# Patient Record
Sex: Male | Born: 1980 | Race: Black or African American | Hispanic: No | Marital: Single | Smoking: Never smoker
Health system: Southern US, Community
[De-identification: ages and names within clinical notes are randomized; demographics above are authoritative.]

## PROBLEM LIST (undated history)

## (undated) DIAGNOSIS — K219 Gastro-esophageal reflux disease without esophagitis: Secondary | ICD-10-CM

## (undated) DIAGNOSIS — F79 Unspecified intellectual disabilities: Secondary | ICD-10-CM

## (undated) DIAGNOSIS — Z87898 Personal history of other specified conditions: Secondary | ICD-10-CM

## (undated) DIAGNOSIS — F32A Depression, unspecified: Secondary | ICD-10-CM

## (undated) DIAGNOSIS — F840 Autistic disorder: Secondary | ICD-10-CM

## (undated) DIAGNOSIS — F329 Major depressive disorder, single episode, unspecified: Secondary | ICD-10-CM

## (undated) DIAGNOSIS — R569 Unspecified convulsions: Secondary | ICD-10-CM

## (undated) HISTORY — DX: Gastro-esophageal reflux disease without esophagitis: K21.9

## (undated) HISTORY — DX: Unspecified intellectual disabilities: F79

## (undated) HISTORY — DX: Personal history of other specified conditions: Z87.898

## (undated) HISTORY — DX: Depression, unspecified: F32.A

## (undated) HISTORY — DX: Autistic disorder: F84.0

---
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
## 2019-02-07 ENCOUNTER — Other Ambulatory Visit: Payer: Self-pay | Admitting: Family Medicine

## 2019-02-07 ENCOUNTER — Ambulatory Visit
Admission: RE | Admit: 2019-02-07 | Discharge: 2019-02-07 | Disposition: A | Discharge status: Home / Self Care | Payer: Medicare Other | Source: Ambulatory Visit | Attending: Family Medicine | Admitting: Family Medicine

## 2019-02-07 DIAGNOSIS — M24549 Contracture, unspecified hand: Secondary | ICD-10-CM

## 2019-02-07 DIAGNOSIS — M24574 Contracture, right foot: Secondary | ICD-10-CM

## 2019-08-15 ENCOUNTER — Encounter: Payer: Self-pay | Admitting: Gastroenterology

## 2019-09-14 ENCOUNTER — Ambulatory Visit: Payer: Medicare Other | Admitting: Gastroenterology

## 2019-10-26 ENCOUNTER — Other Ambulatory Visit (INDEPENDENT_AMBULATORY_CARE_PROVIDER_SITE_OTHER): Payer: Medicare Other

## 2019-10-26 ENCOUNTER — Ambulatory Visit (INDEPENDENT_AMBULATORY_CARE_PROVIDER_SITE_OTHER): Payer: Medicare Other | Admitting: Gastroenterology

## 2019-10-26 ENCOUNTER — Encounter: Payer: Self-pay | Admitting: Gastroenterology

## 2019-10-26 VITALS — BP 102/64 | HR 68 | Temp 97.0°F | Wt 128.0 lb

## 2019-10-26 DIAGNOSIS — K625 Hemorrhage of anus and rectum: Secondary | ICD-10-CM

## 2019-10-26 DIAGNOSIS — K649 Unspecified hemorrhoids: Secondary | ICD-10-CM

## 2019-10-26 LAB — BASIC METABOLIC PANEL
BUN: 11 mg/dL (ref 6–23)
CO2: 32 mEq/L (ref 19–32)
Calcium: 9.9 mg/dL (ref 8.4–10.5)
Chloride: 102 mEq/L (ref 96–112)
Creatinine, Ser: 0.87 mg/dL (ref 0.40–1.50)
GFR: 117.84 mL/min (ref >60.00)
Glucose, Bld: 84 mg/dL (ref 70–99)
Potassium: 3.9 mEq/L (ref 3.5–5.1)
Sodium: 138 mEq/L (ref 135–145)

## 2019-10-26 LAB — CBC
HCT: 38.6 % — ABNORMAL LOW (ref 39.0–52.0)
Hemoglobin: 13 g/dL (ref 13.0–17.0)
MCHC: 33.8 g/dL (ref 30.0–36.0)
MCV: 93.8 fl (ref 78.0–100.0)
Platelets: 181 10*3/uL (ref 150.0–400.0)
RBC: 4.12 Mil/uL — ABNORMAL LOW (ref 4.22–5.81)
RDW: 13.4 % (ref 11.5–15.5)
WBC: 3.2 10*3/uL — ABNORMAL LOW (ref 4.0–10.5)

## 2019-10-26 LAB — TSH: TSH: 5.8 u[IU]/mL — ABNORMAL HIGH (ref 0.35–4.50)

## 2019-10-26 NOTE — Patient Instructions (Signed)
Your provider has requested that you go to the basement level for lab work before leaving today. Press "B" on the elevator. The lab is located at the first door on the left as you exit the elevator.  If you are age 40 or older, your body mass index should be between 23-30. Your There is no height or weight on file to calculate BMI. If this is out of the aforementioned range listed, please consider follow up with your Primary Care Provider.  If you are age 25 or younger, your body mass index should be between 19-25. Your There is no height or weight on file to calculate BMI. If this is out of the aformentioned range listed, please consider follow up with your Primary Care Provider.    Due to recent changes in healthcare laws, you may see the results of your imaging and laboratory studies on MyChart before your provider has had a chance to review them.  We understand that in some cases there may be results that are confusing or concerning to you. Not all laboratory results come back in the same time frame and the provider may be waiting for multiple results in order to interpret others.  Please give [place] 48 hours in order for your provider to thoroughly review all the results before contacting the office for clarification of your results.   It has been recommended to you by your physician that you have a(n) colonoscopy completed. Dr.Mansouraty will call Guardian this afternoon to discuss- Andres [name] [phone].   Start Fiber Supplement- A high fiber diet with plenty of fluids (up to 8 glasses of water daily) is suggested to relieve these symptoms.  Metamucil, 1 tablespoon once or twice daily can be used to keep bowels regular if needed.   If bleeding occurs then you may start Preparation H ointment placed on outside and to first digit perineal twice daily up to 5 days.   Thank you for choosing me and [facility].  Dr. Meridee Score

## 2019-10-26 NOTE — Progress Notes (Signed)
GASTROENTEROLOGY OUTPATIENT CLINIC VISIT   Primary Care Provider Leilani Able, MD [address] [place] [address] [phone]  Referring Provider Leilani Able, MD [address] [place],  [place] [address] [phone]  Patient Profile: Rommie Dunn is a 39 y.o. male with a pmh significant for seizures, intellectual disability under guardianship, hemorrhoids.  The patient presents to the [facility] for an evaluation and management of problem(s) noted below:  Problem List 1. BRBPR (bright red blood per rectum)   2. Hemorrhoids, unspecified hemorrhoid type     History of Present Illness This is the patient's first visit to the outpatient [facility] clinic.  He is accompanied by his caretaker today.  Unfortunately the patient cannot partake of or give any history.  The history that we obtain from his caretaker is such that he has had episodes of rectal bleeding noted on the toilet paper and sometimes in the toilet bowl over the course of the last year.  This is happened at least 3-4 times since June 2020.  When evaluated at [facility] family practice, the patient could not have a rectal exam performed due to cooperation issues.  He was referred for discussion and evaluation in the GI office and that is his visit here today.  There is an unknown family history.  The patient has a large midline surgical scar but the etiology of this or what was done is not clear.  He is a ward of the state and has a guardian [name]).  The patient is not on nonsteroidals.  From asking the caretaker, there is no other significant history that is obtained in regards to GI issues as the patient cannot verbally express himself.  GI Review of Systems Positive as above Negative for overt nausea/vomiting, melena  Review of Systems (Per Caretaker) General: Denies fevers/chills/weight loss HEENT: Denies oral lesions Gastroenterological: See HPI Genitourinary: Denies darkened  urine or hematuria Hematological: Denies easy bruising/bleeding Dermatological: Denies jaundice   Medications Current Outpatient Medications  Medication Sig Dispense Refill  . ARIPiprazole (ABILIFY) 10 MG tablet Take 10 mg by mouth daily.    . Cholecalciferol (VITAMIN D3) 20 MCG (800 UNIT) TABS Take 1 tablet by mouth once.    . famotidine (PEPCID) 20 MG tablet Take 20 mg by mouth 2 (two) times daily.    . traZODone (DESYREL) 100 MG tablet Take 100 mg by mouth every evening.     No current facility-administered medications for this visit.    Allergies No Known Allergies  Histories Past Medical History:  Diagnosis Date  . Disorder of intellectual development   . History of seizures    History reviewed. No pertinent surgical history. Social History   Socioeconomic History  . Marital status: Single    Spouse name: Not on file  . Number of children: Not on file  . Years of education: Not on file  . Highest education level: Not on file  Occupational History  . Not on file  Tobacco Use  . Smoking status: Never Smoker  Substance and Sexual Activity  . Alcohol use: Not Currently  . Drug use: Not Currently  . Sexual activity: Not Currently  Other Topics Concern  . Not on file  Social History Narrative  . Not on file   Social Determinants of Health   Financial Resource Strain:   . Difficulty of Paying Living Expenses:   Food Insecurity:   . Worried About Programme researcher, broadcasting/film/video in the Last Year:   .  Ran Out of Food in the Last Year:   Transportation Needs:   . Freight forwarder (Medical):   Marland Kitchen Lack of Transportation (Non-Medical):   Physical Activity:   . Days of Exercise per Week:   . Minutes of Exercise per Session:   Stress:   . Feeling of Stress :   Social Connections:   . Frequency of Communication with Friends and Family:   . Frequency of Social Gatherings with Friends and Family:   . Attends Religious Services:   . Active Member of Clubs or Organizations:    . Attends Banker Meetings:   Marland Kitchen Marital Status:   Intimate Partner Violence:   . Fear of Current or Ex-Partner:   . Emotionally Abused:   Marland Kitchen Physically Abused:   . Sexually Abused:    Family History  Problem Relation Age of Onset  . Mental illness Mother    The patient's family history is not able due to patient's intellectual disability.  Otherwise, I have reviewed his medical, social, and family history in detail and updated the electronic medical record as necessary.    PHYSICAL EXAMINATION  BP 102/64 (BP Location: Left Arm, Patient Position: Sitting, Cuff Size: Normal)   Pulse 68   Temp (!) 97 F (36.1 C) (Other (Comment))   Wt 128 lb (58.1 kg)  Wt Readings from Last 3 Encounters:  10/26/19 128 lb (58.1 kg)  GEN: Resting in chair today but able to get onto exam table, appears older than stated age  PSYCH: Cooperative (he had received some Klonopin prior by caretaker) EYE: Conjunctivae pink, sclerae anicteric ENT: MMM, would not open his mouth CV: RR without R/Gs  RESP: CTAB posteriorly, without wheezing GI: NABS, soft, large midline surgical scar that is well-healed, NT/ND, without rebound or guarding, no HSM appreciated GU: Perineal exam shows no large external hemorrhoids, digital rectal exam suggests internal hemorrhoids but no palpable rectal lesions, anoscopy could not be performed due to cooperation MSK/EXT: No significant lower extremity edema SKIN: No jaundice   REVIEW OF DATA  I reviewed the following data at the time of this encounter:  GI Procedures and Studies  No relevant studies to review  Laboratory Studies  Reported hemoglobin 11.2 at his family practice visit in January  Imaging Studies  No relevant studies to review   ASSESSMENT  Mr. Beaumier is a 39 y.o. male with a pmh significant for seizures, intellectual disability under guardianship, hemorrhoids.  The patient is seen today for evaluation and management of:  1. BRBPR (bright  red blood per rectum)   2. Hemorrhoids, unspecified hemorrhoid type    The patient is hemodynamically stable.  From a clinical perspective, this seems like it is most likely that the patient has had hemorrhoidal bleeding due to the frequency and episodic nature of things.  Unfortunately due to his intellectual disability he is unable to be a part of his visit in regards to giving [place] a history.  He has a very large midline surgical scar of an unclear etiology per the caretaker but potentially the guardian may have more information about this.  I would recommend that we initiate fiber supplementation as well as have MiraLAX available if needed.  I recommend that we consider a colonoscopy.  We will reach out to the guardian [name] - [phone]) in the next couple of days and see if we can get the approval for moving forward with a colonoscopy.  We will obtain some labs to ensure that he  does not have evidence of overt anemia or progressive anemia as well as rule out other etiologies for potential constipation.  All patient questions were answered, to the best of my ability, and the patient agrees to the aforementioned plan of action with follow-up as indicated.   PLAN  Laboratories as outlined below Recommend diagnostic colonoscopy Reach out to [place] ([phone]) in coming days to obtain approval and potentially more information/history about midline surgical scar   Orders Placed This Encounter  Procedures  . CBC  . Basic Metabolic Panel (BMET)  . Calcium, ionized  . TSH    New Prescriptions   No medications on file   Modified Medications   No medications on file    Planned Follow Up No follow-ups on file.   Total Time in Face-to-Face and in Coordination of Care for patient including independent/personal interpretation/review of prior testing, medical history, examination, medication adjustment, communicating results with the patient directly, and documentation  with the EHR is 45 minutes.   Justice Britain, MD [facility] Advanced Endoscopy Office # [phone]

## 2019-10-27 LAB — CALCIUM, IONIZED: Calcium, Ion: 5.21 mg/dL (ref 4.8–5.6)

## 2019-10-28 ENCOUNTER — Telehealth: Payer: Self-pay | Admitting: Gastroenterology

## 2019-10-28 ENCOUNTER — Encounter: Payer: Self-pay | Admitting: Gastroenterology

## 2019-10-28 DIAGNOSIS — K649 Unspecified hemorrhoids: Secondary | ICD-10-CM | POA: Insufficient documentation

## 2019-10-28 DIAGNOSIS — K625 Hemorrhage of anus and rectum: Secondary | ICD-10-CM | POA: Insufficient documentation

## 2019-10-28 NOTE — Telephone Encounter (Signed)
Tried to call patient's guardian [name] [phone]) on 10/28/2019. Left voicemail for call back. Goal of discussion with guardian is to try to obtain consent for a diagnostic colonoscopy for evaluation of bright red blood per rectum and also see if any additional history is noted for the the patient's midline surgical scar or surgical history. I left a voicemail for patient's guardian to call back next week. Inez Catalina, please place a reminder for you all to reach out to the guardian in case you have not seen any update from me by 11/01/2019 or if the patient's guardian has not given [place] a call back. Thanks. GM

## 2019-11-01 NOTE — Telephone Encounter (Signed)
Also attempted a call back and left a voicemail today 11/01/2019. Hopefully, we will hear back from the guardian. Patty, if we do not hear anything back by next Monday or Tuesday, please reach out to the patient's contact who brought him in her name is in the chart and update her that we have tried to get a hold of Ms. Patel but not been successful. Hopefully she could then help get [place] in contact with Ms. Allena Katz or anyone else that may be able to help [place] with Mr. Hua care. Thanks. GM

## 2019-11-01 NOTE — Telephone Encounter (Signed)
Left message on machine to call back  

## 2019-11-02 NOTE — Telephone Encounter (Signed)
Staff message to call number in chart next Tuesday if we have not heard from the caregiver this week.

## 2019-11-02 NOTE — Telephone Encounter (Signed)
I was able to touch base with Andres [name] today at [phone].  She is the patient's guardian of the state.  The guardian has been aware of this particular patient's history for quite a while however it is not clear what the etiology of his large surgical scar in the abdomen is from.  She has known about it since he was at least 39 years of age but again unclear what the etiology of it is.  We discussed the role of a potential diagnostic colonoscopy.  She describes that in the patient's prior home that they also were experiencing issues of intermittent rectal bleeding.  I think with all of this information at this time it is most reasonable for [place] to do a diagnostic evaluation ensure we are not missing any mass or lesion or bleeding polyp.  I suspect will turn out to be hemorrhoidal in nature but still need to do our due diligence. The risks and benefits of endoscopic evaluation were discussed with the patient's guardian; these include but are not limited to the risk of perforation, infection, bleeding, missed lesions, lack of diagnosis, severe illness requiring hospitalization, as well as anesthesia and sedation related illnesses.  The patient guardian is agreeable to proceed.  I will have my team reach out to Ms. Patel tomorrow to work on obtaining the consent process over the telephone.  She would like a call back after 10 in the morning on 11/03/2019. We can proceed with scheduling his colonoscopy after the consent process has been completed.  Patty and Rovonda can you please work on this? Thanks. GM

## 2019-11-03 NOTE — Telephone Encounter (Signed)
Records faxed to PCP and mailed to guardian and pt.

## 2019-11-03 NOTE — Telephone Encounter (Signed)
Left message on machine to call back  

## 2019-11-03 NOTE — Telephone Encounter (Signed)
Mansouraty, Netty Starring., MD  Loretha Stapler, RN  Shadrach Bartunek, Please make a copy of the results for the patient and send a letter to his home/guardianship home as well as his PCP/referring provider.  This is for the records.  His TSH level is slightly elevated and could be consistent with possible developing hypothyroidism.  He needs additional work-up by his primary care doctor.  The patient's hemoglobin has normalized from prior of 11.8 in the chart previously from his referring provider. No follow-up labs in our clinic needed at this time. GM

## 2019-11-06 ENCOUNTER — Other Ambulatory Visit: Payer: Self-pay

## 2019-11-06 DIAGNOSIS — Z1159 Encounter for screening for other viral diseases: Secondary | ICD-10-CM

## 2019-11-06 NOTE — Telephone Encounter (Signed)
The pt guardian has been advised of the appt for 5/11-pre visit, 5/14- Covid, and 5/18 colon.  She will come in and sign consent at the pre visit and make the facility where the pt lives aware of the appts.

## 2019-11-06 NOTE — Telephone Encounter (Signed)
Left message on machine to call back  

## 2019-12-06 ENCOUNTER — Ambulatory Visit: Payer: Medicare Other | Admitting: *Deleted

## 2019-12-06 ENCOUNTER — Other Ambulatory Visit: Payer: Self-pay

## 2019-12-06 VITALS — Ht 67.0 in | Wt 128.0 lb

## 2019-12-06 DIAGNOSIS — Z01818 Encounter for other preprocedural examination: Secondary | ICD-10-CM

## 2019-12-06 DIAGNOSIS — K625 Hemorrhage of anus and rectum: Secondary | ICD-10-CM

## 2019-12-06 NOTE — Progress Notes (Signed)
Andres [name], pt's guardian, is here to sign pt's consent form.  Spoke with Andres [name] at pt's group home to obtain hx and go over prep instructions.  I spoke with them both to obtain medical hx.  Andres [name] states she will helping pt prep for this procedure at home.  She will be here for pt's procedure and Andres [name] will be available by phone if needed.  Pt is able to ambulate on his own per guardian.  Per guardian and Andres [name]- pt has been at home for over a year and no seizure activity noted.  Pt is aware that care partner will wait in the car during procedure; if they feel like they will be too hot or cold to wait in the car; they may wait in the 4 th floor lobby. Patient is aware to bring only one care partner. We want them to wear a mask (we do not have any that we can provide them), practice social distancing, and we will check their temperatures when they get here.  I did remind the patient that their care partner needs to stay in the parking lot the entire time and have a cell phone available, we will call them when the pt is ready for discharge. Patient will wear mask into building.  Prep instructions given to Andres [name] and she states she will get information to pt's home.   No egg or soy allergy  No home oxygen use   No medications for weight loss taken  covid test 12-08-19 at 10:00

## 2019-12-08 ENCOUNTER — Ambulatory Visit (INDEPENDENT_AMBULATORY_CARE_PROVIDER_SITE_OTHER): Payer: Medicare Other

## 2019-12-08 ENCOUNTER — Other Ambulatory Visit: Payer: Self-pay

## 2019-12-08 DIAGNOSIS — Z1159 Encounter for screening for other viral diseases: Secondary | ICD-10-CM

## 2019-12-08 LAB — SARS CORONAVIRUS 2 (TAT 6-24 HRS): SARS Coronavirus 2: NEGATIVE

## 2019-12-12 ENCOUNTER — Encounter: Payer: Self-pay | Admitting: Gastroenterology

## 2019-12-12 ENCOUNTER — Ambulatory Visit (AMBULATORY_SURGERY_CENTER): Payer: Medicare Other | Admitting: Gastroenterology

## 2019-12-12 ENCOUNTER — Other Ambulatory Visit: Payer: Self-pay

## 2019-12-12 VITALS — BP 95/62 | HR 52 | Temp 97.1°F | Resp 17 | Ht 67.0 in | Wt 128.0 lb

## 2019-12-12 DIAGNOSIS — K921 Melena: Secondary | ICD-10-CM

## 2019-12-12 DIAGNOSIS — K648 Other hemorrhoids: Secondary | ICD-10-CM | POA: Diagnosis not present

## 2019-12-12 DIAGNOSIS — K625 Hemorrhage of anus and rectum: Secondary | ICD-10-CM | POA: Diagnosis not present

## 2019-12-12 NOTE — Op Note (Signed)
Center Patient Name: Andres [name] Procedure Date: 12/12/2019 2:08 PM MRN: [id number] Endoscopist: Justice Britain , MD Age: 39 Referring MD:  Date of Birth: 03-13-1981 Gender: Male Account #: [id number] Procedure:                Colonoscopy Indications:              Hematochezia, Rectal bleeding Medicines:                Monitored Anesthesia Care Procedure:                Pre-Anesthesia Assessment:                           - Prior to the procedure, a History and Physical                            was performed, and patient medications and                            allergies were reviewed. The patient's tolerance of                            previous anesthesia was also reviewed. The risks                            and benefits of the procedure and the sedation                            options and risks were discussed with the patient.                            All questions were answered, and informed consent                            was obtained. Prior Anticoagulants: The patient has                            taken no previous anticoagulant or antiplatelet                            agents. ASA Grade Assessment: II - A patient with                            mild systemic disease. After reviewing the risks                            and benefits, the patient was deemed in                            satisfactory condition to undergo the procedure.                           After obtaining informed consent, the colonoscope  was passed under direct vision. Throughout the                            procedure, the patient's blood pressure, pulse, and                            oxygen saturations were monitored continuously. The                            Colonoscope was introduced through the anus and                            advanced to the the ileocolonic anastomosis. The                            colonoscopy was performed  without difficulty. The                            patient tolerated the procedure. The quality of the                            bowel preparation was adequate. The terminal ileum                            was photographed. Scope In: 2:15:00 PM Scope Out: 2:29:09 PM Scope Withdrawal Time: 0 hours 8 minutes 41 seconds  Total Procedure Duration: 0 hours 14 minutes 9 seconds  Findings:                 The digital rectal exam findings include                            hemorrhoids. Pertinent negatives include no                            palpable rectal lesions.                           There was evidence of a prior functional end-to-end                            ileo-colonic anastomosis in the ascending colon.                            This was patent and was characterized by healthy                            appearing mucosa. The anastomosis was traversed.                           The neo-terminal ileum appeared normal.                           The lumen of the colon (entire examined portion)  was significantly dilated - consistent with                            potential Institutionalized bowel or Ogilvie's-like                            syndrome. Suction of the air via Endoscope was                            performed to decrease caliber of stool.                           Normal mucosa was found in the entire colon                            otherwise.                           Non-bleeding non-thrombosed external and internal                            hemorrhoids were found during retroflexion, during                            perianal exam and during digital exam. The                            hemorrhoids were Grade II (internal hemorrhoids                            that prolapse but reduce spontaneously). Complications:            No immediate complications. Estimated Blood Loss:     Estimated blood loss: none. Impression:               -  Hemorrhoids found on digital rectal exam.                           - Patent functional end-to-end ileo-colonic                            anastomosis, characterized by healthy appearing                            mucosa.                           - The examined portion of the neo-terminal ileum                            was normal.                           - Dilated in the entire examined colon - consistent                            with Institutionalized bowel or  Ogilvie's-like                            syndrome. Suction of air via endoscope.                           - Normal mucosa in the entire examined colon                            otherwise.                           - Non-bleeding non-thrombosed external and internal                            hemorrhoids. Recommendation:           - The patient will be observed post-procedure,                            until all discharge criteria are met.                           - Discharge patient to home.                           - Patient has a contact number available for                            emergencies. The signs and symptoms of potential                            delayed complications were discussed with the                            patient. Return to normal activities tomorrow.                            Written discharge instructions were provided to the                            patient.                           - High fiber diet.                           - Use FiberCon 1 tablet PO daily.                           - Colace 200 mg daily.                           - Miralax 1-2 times daily to try and have at least                            1 bowel movement daily.                           -  Recommend considering Anusol suppositories QHS x                            1-week and then every other evening before bedtime                            in order to decrease inflammation in the                             rectum/anal vault.                           - If issues of bleeding recur/persist recommend a                            Colorectal surgery referral for discussion of                            hemorrhoidal banding in conjunction with                            hemorrhoidectomy - will likely need to be done as                            EUA due to patient's cooperation and needs.                           - Recommend for follow up in clinic in 20-month to                            see how he is doing and try to do an Anoscopy and                            re-evaluate the hemorrhoids.                           - Repeat colonoscopy in 10 years for screening                            purposes.                           - The findings and recommendations were discussed                            with the designated responsible adult. GJustice Britain MD 12/12/2019 2:40:23 PM

## 2019-12-12 NOTE — Patient Instructions (Signed)
Handout on hemorrhoids given to you today  Use FiberCon 1 tablet daily  Colace 200 mg daily  Use miralax 1-2 times a day  To try to have at least 1 bowel movement a day  Anusol suppositories sent to [facility] [place] [facility]   YOU HAD AN ENDOSCOPIC PROCEDURE TODAY AT THE [facility]:   Refer to the procedure report that was given to you for any specific questions about what was found during the examination.  If the procedure report does not answer your questions, please call your gastroenterologist to clarify.  If you requested that your care partner not be given the details of your procedure findings, then the procedure report has been included in a sealed envelope for you to review at your convenience later.  YOU SHOULD EXPECT: Some feelings of bloating in the abdomen. Passage of more gas than usual.  Walking can help get rid of the air that was put into your GI tract during the procedure and reduce the bloating. If you had a lower endoscopy (such as a colonoscopy or flexible sigmoidoscopy) you may notice spotting of blood in your stool or on the toilet paper. If you underwent a bowel prep for your procedure, you may not have a normal bowel movement for a few days.  Please Note:  You might notice some irritation and congestion in your nose or some drainage.  This is from the oxygen used during your procedure.  There is no need for concern and it should clear up in a day or so.  SYMPTOMS TO REPORT IMMEDIATELY:   Following lower endoscopy (colonoscopy or flexible sigmoidoscopy):  Excessive amounts of blood in the stool  Significant tenderness or worsening of abdominal pains  Swelling of the abdomen that is new, acute  Fever of 100F or higher  For urgent or emergent issues, a gastroenterologist can be reached at any hour by calling [phone]. Do not use MyChart messaging for urgent concerns.    DIET:  We do recommend a small meal at first, but then you may proceed to your  regular diet.  Drink plenty of fluids but you should avoid alcoholic beverages for 24 hours.  ACTIVITY:  You should plan to take it easy for the rest of today and you should NOT DRIVE or use heavy machinery until tomorrow (because of the sedation medicines used during the test).    FOLLOW UP: Our staff will call the number listed on your records 48-72 hours following your procedure to check on you and address any questions or concerns that you may have regarding the information given to you following your procedure. If we do not reach you, we will leave a message.  We will attempt to reach you two times.  During this call, we will ask if you have developed any symptoms of COVID 19. If you develop any symptoms (ie: fever, flu-like symptoms, shortness of breath, cough etc.) before then, please call [phone].  If you test positive for Covid 19 in the 2 weeks post procedure, please call and report this information to [place].    If any biopsies were taken you will be contacted by phone or by letter within the next 1-3 weeks.  Please call us at [phone] if you have not heard about the biopsies in 3 weeks.    SIGNATURES/CONFIDENTIALITY: You and/or your care partner have signed paperwork which will be entered into your electronic medical record.  These signatures attest to the fact that that the information  on your After Visit Summary has been reviewed and is understood.  Full responsibility of the confidentiality of this discharge information lies with you and/or your care-partner.  

## 2019-12-12 NOTE — Progress Notes (Signed)
Temp LC V/s DT I have reviewed the patient's medical history in detail and updated the computerized patient record.  

## 2019-12-12 NOTE — Progress Notes (Signed)
Report to PACU, RN, vss, BBS= Clear.  

## 2019-12-14 ENCOUNTER — Telehealth: Payer: Self-pay

## 2019-12-14 NOTE — Telephone Encounter (Signed)
  Follow up Call-  Call back number 12/12/2019  Post procedure Call Back phone  # [phone]  Permission to leave phone message Yes     Patient questions:  Do you have a fever, pain , or abdominal swelling? No. Pain Score  0 *  Have you tolerated food without any problems? Yes.    Have you been able to return to your normal activities? Yes.    Do you have any questions about your discharge instructions: Diet   No. Medications  No. Follow up visit  No.  Do you have questions or concerns about your Care? No.  Actions: * If pain score is 4 or above: No action needed, pain <4.  1. Have you developed a fever since your procedure? no  2.   Have you had an respiratory symptoms (SOB or cough) since your procedure? no  3.   Have you tested positive for COVID 19 since your procedure no  4.   Have you had any family members/close contacts diagnosed with the COVID 19 since your procedure?  no   If yes to any of these questions please route to Laverna Peace, RN and Charlett Lango, RN

## 2020-06-16 ENCOUNTER — Other Ambulatory Visit: Payer: Self-pay

## 2020-06-16 ENCOUNTER — Ambulatory Visit (HOSPITAL_COMMUNITY)
Admission: EM | Admit: 2020-06-16 | Discharge: 2020-06-16 | Disposition: A | Discharge status: Home / Self Care | Payer: Medicare Other | Attending: Family Medicine | Admitting: Family Medicine

## 2020-06-16 ENCOUNTER — Encounter (HOSPITAL_COMMUNITY): Payer: Self-pay | Admitting: *Deleted

## 2020-06-16 DIAGNOSIS — S0181XA Laceration without foreign body of other part of head, initial encounter: Secondary | ICD-10-CM

## 2020-06-16 NOTE — Discharge Instructions (Signed)
Keep area clean and reasonably dry Try to keep these tapes on for 5 days, 7 is better When it is time to remove them, put a warm washcloth on thumb to soak the area and then gently tease them of the room Watch for infection.  Report any redness, drainage, pus Return as needed

## 2020-06-16 NOTE — ED Provider Notes (Signed)
[facility]    CSN: [id number] Arrival date & time: 06/16/20  1630      History   Chief Complaint Chief Complaint  Patient presents with  . Laceration  . Fall    HPI Andres [name] is a 39 y.o. male.   HPI  Andres [name] is a 39 year old gentleman with severe intellectual developmental deficiencies and autism who is here today for a follow-up.  He has balance issues.  He fell while in his bedroom and hit his face on the bed frame.  His caregiver was in the next room and immediately ran in.  He was sitting on the floor.  There is blood.  He was not unconscious, was crying out in pain.  Bleeding controlled with pressure.  Band-Aid placed and he was brought in directly.  He appears to be at his baseline at this time with ambulation and behavior  Past Medical History:  Diagnosis Date  . Autism   . Depression   . Disorder of intellectual development   . GERD (gastroesophageal reflux disease)   . History of seizures   . Seizures (HCC)    no seizure activity seen in over a year    Patient Active Problem List   Diagnosis Date Noted  . BRBPR (bright red blood per rectum) 10/28/2019  . Hemorrhoids 10/28/2019    History reviewed. No pertinent surgical history.     Home Medications    Prior to Admission medications   Medication Sig Start Date End Date Taking? Authorizing Provider  ARIPiprazole (ABILIFY) 10 MG tablet Take 10 mg by mouth daily.   Yes [provider]  Cholecalciferol (VITAMIN D3) 20 MCG (800 UNIT) TABS Take 1 tablet by mouth once.   Yes [provider]  famotidine (PEPCID) 20 MG tablet Take 20 mg by mouth 2 (two) times daily.   Yes [provider]  traZODone (DESYREL) 100 MG tablet Take 100 mg by mouth every evening.   Yes [provider]    Family History Family History  Problem Relation Age of Onset  . Mental illness Mother     Social History Social History   Tobacco Use  . Smoking status: Never Smoker  .  Smokeless tobacco: Never Used  Vaping Use  . Vaping Use: Never used  Substance Use Topics  . Alcohol use: Not Currently  . Drug use: Not Currently     Allergies   Lactose   Review of Systems Review of Systems  See HPI Physical Exam Triage Vital Signs ED Triage Vitals  Enc Vitals Group     BP --      Pulse Rate 06/16/20 1734 94     Resp 06/16/20 1734 20     Temp 06/16/20 1734 (!) 97.5 F (36.4 C)     Temp Source 06/16/20 1734 Tympanic     SpO2 06/16/20 1734 99 %     Weight 06/16/20 1728 103 lb 3.2 oz (46.8 kg)     Height 06/16/20 1728 5\' 6"  (1.676 m)     Head Circumference --      Peak Flow --      Pain Score --      Pain Loc --      Pain Edu? --      Excl. in GC? --    No data found.  Updated Vital Signs Pulse 94   Temp (!) 97.5 F (36.4 C) (Tympanic)   Resp 20   Ht 5\' 6"  (1.676 m)   Wt  46.8 kg   SpO2 99%   BMI 16.66 kg/m  Unable to obtain blood pressure due to patient cooperation, it caused anxiety, refused care      Physical Exam Constitutional:      General: He is not in acute distress.    Appearance: He is well-developed.     Comments: Patient is quite lean.  He keeps his fists clenched in his arms flexed with hands under chin.  Antalgic gait.  No appreciable verbalization  HENT:     Head: Normocephalic.   Eyes:     Conjunctiva/sclera: Conjunctivae normal.     Pupils: Pupils are equal, round, and reactive to light.  Cardiovascular:     Rate and Rhythm: Normal rate.  Pulmonary:     Effort: Pulmonary effort is normal. No respiratory distress.  Abdominal:     General: There is no distension.     Palpations: Abdomen is soft.  Musculoskeletal:        General: Normal range of motion.     Cervical back: Normal range of motion.  Skin:    General: Skin is warm and dry.  Neurological:     Mental Status: He is alert.  Psychiatric:        Attention and Perception: He is inattentive.        Speech: He is noncommunicative.        Behavior:  Behavior is uncooperative, slowed and withdrawn.   After discussion with the caregiver it was felt that it would be unduly traumatic for Andres [name] to have sutures placedunless he could be sedated.  Sedation was not available in the urgent care center.  Scarring is not a big issue.  Appropriate healing is the key.  The area is wiped down with a skin cleanser.  Benzoin placed.  3 Steri-Strips closed the wound nicely.  As long as patient does not pick at these or pulls them off, it should heal well.  Recommended increased observation.  Recommend they keep it bandaged.  Recommend mittens at night.   UC Treatments / Results  Labs (all labs ordered are listed, but only abnormal results are displayed) Labs Reviewed - No data to display  EKG   Radiology No results found.  Procedures Procedures (including critical care time)  Medications Ordered in UC Medications - No data to display  Initial Impression / Assessment and Plan / UC Course  I have reviewed the triage vital signs and the nursing notes.  Pertinent labs & imaging results that were available during my care of the patient were reviewed by me and considered in my medical decision making (see chart for details).     Final Clinical Impressions(s) / UC Diagnoses   Final diagnoses:  Facial laceration, initial encounter     Discharge Instructions     Keep area clean and reasonably dry Try to keep these tapes on for 5 days, 7 is better When it is time to remove them, put a warm washcloth on thumb to soak the area and then gently tease them of the room Watch for infection.  Report any redness, drainage, pus Return as needed   ED Prescriptions    None     PDMP not reviewed this encounter.   Eustace Moore, MD 06/16/20 Flossie Buffy

## 2020-06-16 NOTE — ED Triage Notes (Signed)
Pt from a [facility] home with Care giver. Pt has a lac to Rtside of eye brow with a dsy on it. Care giver reports Pt had a unwitnessed fall and may have hit the wooden section of bed. . Pt is non verable at base line.

## 2020-07-01 ENCOUNTER — Emergency Department (HOSPITAL_COMMUNITY): Payer: Medicare Other

## 2020-07-01 ENCOUNTER — Inpatient Hospital Stay (HOSPITAL_COMMUNITY)
Admission: EM | Admit: 2020-07-01 | Discharge: 2020-08-21 | DRG: 377 | Disposition: A | Discharge status: Hospice | Payer: Medicare Other | Attending: Internal Medicine | Admitting: Internal Medicine

## 2020-07-01 ENCOUNTER — Encounter (HOSPITAL_COMMUNITY): Payer: Self-pay | Admitting: Student

## 2020-07-01 DIAGNOSIS — J869 Pyothorax without fistula: Secondary | ICD-10-CM | POA: Diagnosis not present

## 2020-07-01 DIAGNOSIS — R748 Abnormal levels of other serum enzymes: Secondary | ICD-10-CM | POA: Diagnosis present

## 2020-07-01 DIAGNOSIS — L89122 Pressure ulcer of left upper back, stage 2: Secondary | ICD-10-CM | POA: Diagnosis present

## 2020-07-01 DIAGNOSIS — E871 Hypo-osmolality and hyponatremia: Secondary | ICD-10-CM | POA: Diagnosis not present

## 2020-07-01 DIAGNOSIS — R7401 Elevation of levels of liver transaminase levels: Secondary | ICD-10-CM

## 2020-07-01 DIAGNOSIS — Z515 Encounter for palliative care: Secondary | ICD-10-CM

## 2020-07-01 DIAGNOSIS — Z8719 Personal history of other diseases of the digestive system: Secondary | ICD-10-CM

## 2020-07-01 DIAGNOSIS — E860 Dehydration: Secondary | ICD-10-CM | POA: Diagnosis present

## 2020-07-01 DIAGNOSIS — L8990 Pressure ulcer of unspecified site, unspecified stage: Secondary | ICD-10-CM | POA: Insufficient documentation

## 2020-07-01 DIAGNOSIS — D696 Thrombocytopenia, unspecified: Secondary | ICD-10-CM | POA: Diagnosis present

## 2020-07-01 DIAGNOSIS — I96 Gangrene, not elsewhere classified: Secondary | ICD-10-CM | POA: Diagnosis not present

## 2020-07-01 DIAGNOSIS — M2450 Contracture, unspecified joint: Secondary | ICD-10-CM | POA: Diagnosis present

## 2020-07-01 DIAGNOSIS — Z789 Other specified health status: Secondary | ICD-10-CM

## 2020-07-01 DIAGNOSIS — D638 Anemia in other chronic diseases classified elsewhere: Secondary | ICD-10-CM | POA: Diagnosis present

## 2020-07-01 DIAGNOSIS — Z818 Family history of other mental and behavioral disorders: Secondary | ICD-10-CM

## 2020-07-01 DIAGNOSIS — K567 Ileus, unspecified: Secondary | ICD-10-CM

## 2020-07-01 DIAGNOSIS — F32A Depression, unspecified: Secondary | ICD-10-CM | POA: Diagnosis present

## 2020-07-01 DIAGNOSIS — R140 Abdominal distension (gaseous): Secondary | ICD-10-CM

## 2020-07-01 DIAGNOSIS — Z0189 Encounter for other specified special examinations: Secondary | ICD-10-CM

## 2020-07-01 DIAGNOSIS — I959 Hypotension, unspecified: Secondary | ICD-10-CM

## 2020-07-01 DIAGNOSIS — A4151 Sepsis due to Escherichia coli [E. coli]: Secondary | ICD-10-CM | POA: Diagnosis not present

## 2020-07-01 DIAGNOSIS — R188 Other ascites: Secondary | ICD-10-CM | POA: Diagnosis not present

## 2020-07-01 DIAGNOSIS — E8809 Other disorders of plasma-protein metabolism, not elsewhere classified: Secondary | ICD-10-CM | POA: Diagnosis present

## 2020-07-01 DIAGNOSIS — M24561 Contracture, right knee: Secondary | ICD-10-CM | POA: Diagnosis present

## 2020-07-01 DIAGNOSIS — W19XXXA Unspecified fall, initial encounter: Secondary | ICD-10-CM | POA: Diagnosis present

## 2020-07-01 DIAGNOSIS — Q02 Microcephaly: Secondary | ICD-10-CM

## 2020-07-01 DIAGNOSIS — Z7189 Other specified counseling: Secondary | ICD-10-CM

## 2020-07-01 DIAGNOSIS — E876 Hypokalemia: Secondary | ICD-10-CM | POA: Diagnosis present

## 2020-07-01 DIAGNOSIS — Z20822 Contact with and (suspected) exposure to covid-19: Secondary | ICD-10-CM | POA: Diagnosis present

## 2020-07-01 DIAGNOSIS — F840 Autistic disorder: Secondary | ICD-10-CM | POA: Diagnosis present

## 2020-07-01 DIAGNOSIS — R310 Gross hematuria: Secondary | ICD-10-CM | POA: Diagnosis not present

## 2020-07-01 DIAGNOSIS — R29721 NIHSS score 21: Secondary | ICD-10-CM | POA: Diagnosis not present

## 2020-07-01 DIAGNOSIS — K5981 Ogilvie syndrome: Secondary | ICD-10-CM | POA: Diagnosis present

## 2020-07-01 DIAGNOSIS — R7982 Elevated C-reactive protein (CRP): Secondary | ICD-10-CM | POA: Diagnosis present

## 2020-07-01 DIAGNOSIS — K31819 Angiodysplasia of stomach and duodenum without bleeding: Secondary | ICD-10-CM

## 2020-07-01 DIAGNOSIS — R197 Diarrhea, unspecified: Secondary | ICD-10-CM

## 2020-07-01 DIAGNOSIS — T7401XA Adult neglect or abandonment, confirmed, initial encounter: Secondary | ICD-10-CM

## 2020-07-01 DIAGNOSIS — M6282 Rhabdomyolysis: Secondary | ICD-10-CM | POA: Diagnosis present

## 2020-07-01 DIAGNOSIS — R627 Adult failure to thrive: Secondary | ICD-10-CM | POA: Diagnosis present

## 2020-07-01 DIAGNOSIS — K31811 Angiodysplasia of stomach and duodenum with bleeding: Secondary | ICD-10-CM | POA: Diagnosis not present

## 2020-07-01 DIAGNOSIS — Z66 Do not resuscitate: Secondary | ICD-10-CM | POA: Diagnosis not present

## 2020-07-01 DIAGNOSIS — R4182 Altered mental status, unspecified: Secondary | ICD-10-CM

## 2020-07-01 DIAGNOSIS — J918 Pleural effusion in other conditions classified elsewhere: Secondary | ICD-10-CM | POA: Diagnosis not present

## 2020-07-01 DIAGNOSIS — N179 Acute kidney failure, unspecified: Secondary | ICD-10-CM | POA: Diagnosis not present

## 2020-07-01 DIAGNOSIS — Z9889 Other specified postprocedural states: Secondary | ICD-10-CM

## 2020-07-01 DIAGNOSIS — Z7982 Long term (current) use of aspirin: Secondary | ICD-10-CM

## 2020-07-01 DIAGNOSIS — R7881 Bacteremia: Secondary | ICD-10-CM | POA: Diagnosis present

## 2020-07-01 DIAGNOSIS — E43 Unspecified severe protein-calorie malnutrition: Secondary | ICD-10-CM | POA: Diagnosis present

## 2020-07-01 DIAGNOSIS — K529 Noninfective gastroenteritis and colitis, unspecified: Secondary | ICD-10-CM | POA: Diagnosis present

## 2020-07-01 DIAGNOSIS — I639 Cerebral infarction, unspecified: Secondary | ICD-10-CM

## 2020-07-01 DIAGNOSIS — J90 Pleural effusion, not elsewhere classified: Secondary | ICD-10-CM

## 2020-07-01 DIAGNOSIS — R7303 Prediabetes: Secondary | ICD-10-CM | POA: Diagnosis present

## 2020-07-01 DIAGNOSIS — I313 Pericardial effusion (noninflammatory): Secondary | ICD-10-CM | POA: Diagnosis not present

## 2020-07-01 DIAGNOSIS — K2100 Gastro-esophageal reflux disease with esophagitis, without bleeding: Secondary | ICD-10-CM

## 2020-07-01 DIAGNOSIS — Y93E1 Activity, personal bathing and showering: Secondary | ICD-10-CM

## 2020-07-01 DIAGNOSIS — R55 Syncope and collapse: Secondary | ICD-10-CM | POA: Diagnosis present

## 2020-07-01 DIAGNOSIS — Z79899 Other long term (current) drug therapy: Secondary | ICD-10-CM

## 2020-07-01 DIAGNOSIS — R509 Fever, unspecified: Secondary | ICD-10-CM

## 2020-07-01 DIAGNOSIS — Z781 Physical restraint status: Secondary | ICD-10-CM

## 2020-07-01 DIAGNOSIS — K56609 Unspecified intestinal obstruction, unspecified as to partial versus complete obstruction: Secondary | ICD-10-CM

## 2020-07-01 DIAGNOSIS — J189 Pneumonia, unspecified organism: Secondary | ICD-10-CM | POA: Diagnosis present

## 2020-07-01 DIAGNOSIS — Z681 Body mass index (BMI) 19 or less, adult: Secondary | ICD-10-CM

## 2020-07-01 DIAGNOSIS — Z7401 Bed confinement status: Secondary | ICD-10-CM

## 2020-07-01 DIAGNOSIS — F79 Unspecified intellectual disabilities: Secondary | ICD-10-CM | POA: Diagnosis present

## 2020-07-01 DIAGNOSIS — G8250 Quadriplegia, unspecified: Secondary | ICD-10-CM | POA: Diagnosis present

## 2020-07-01 DIAGNOSIS — D62 Acute posthemorrhagic anemia: Secondary | ICD-10-CM

## 2020-07-01 DIAGNOSIS — E878 Other disorders of electrolyte and fluid balance, not elsewhere classified: Secondary | ICD-10-CM | POA: Diagnosis not present

## 2020-07-01 DIAGNOSIS — K449 Diaphragmatic hernia without obstruction or gangrene: Secondary | ICD-10-CM | POA: Diagnosis present

## 2020-07-01 DIAGNOSIS — W182XXA Fall in (into) shower or empty bathtub, initial encounter: Secondary | ICD-10-CM | POA: Diagnosis present

## 2020-07-01 DIAGNOSIS — K648 Other hemorrhoids: Secondary | ICD-10-CM | POA: Diagnosis present

## 2020-07-01 DIAGNOSIS — J9811 Atelectasis: Secondary | ICD-10-CM | POA: Diagnosis not present

## 2020-07-01 DIAGNOSIS — E739 Lactose intolerance, unspecified: Secondary | ICD-10-CM | POA: Diagnosis present

## 2020-07-01 DIAGNOSIS — R531 Weakness: Secondary | ICD-10-CM | POA: Diagnosis not present

## 2020-07-01 DIAGNOSIS — R64 Cachexia: Secondary | ICD-10-CM | POA: Diagnosis present

## 2020-07-01 DIAGNOSIS — L89159 Pressure ulcer of sacral region, unspecified stage: Secondary | ICD-10-CM | POA: Diagnosis present

## 2020-07-01 DIAGNOSIS — R911 Solitary pulmonary nodule: Secondary | ICD-10-CM | POA: Diagnosis present

## 2020-07-01 DIAGNOSIS — N390 Urinary tract infection, site not specified: Secondary | ICD-10-CM | POA: Diagnosis present

## 2020-07-01 DIAGNOSIS — L89112 Pressure ulcer of right upper back, stage 2: Secondary | ICD-10-CM | POA: Diagnosis present

## 2020-07-01 DIAGNOSIS — K7200 Acute and subacute hepatic failure without coma: Secondary | ICD-10-CM | POA: Diagnosis not present

## 2020-07-01 DIAGNOSIS — Z978 Presence of other specified devices: Secondary | ICD-10-CM

## 2020-07-01 DIAGNOSIS — Q860 Fetal alcohol syndrome (dysmorphic): Secondary | ICD-10-CM

## 2020-07-01 DIAGNOSIS — F419 Anxiety disorder, unspecified: Secondary | ICD-10-CM | POA: Diagnosis present

## 2020-07-01 NOTE — ED Provider Notes (Signed)
[facility] EMERGENCY DEPARTMENT Provider Note   CSN: [id number] Arrival date & time: 07/01/20  2248     History Chief Complaint  Patient presents with  . Fall    Andres [name] is a 39 y.o. male.with a hx of autism, fetal alcohol syndrome, seizures, GERD, & depression who is non-verbal at baseline presents to the ED via EMS S/p fall this evening shortly PTA. I called & spoke with [name], patient's care giver, who provided history- she relays that patient fell while in the shower, this was unwitnessed, he was not on the floor for long, she is unsure if he hit his head. He has been leaning to the left and drooling a bit more than usual S/p fall. Did not seem to have pain in a specific location for her. She states that at baseline he is able to ambulate, is awake, and can follow commands, however is non-verbal, he has been less interactive and has not ambulated since the fall. Level 5 caveat applies secondary to nonverbal patient.   HPI     Past Medical History:  Diagnosis Date  . Autism   . Depression   . Disorder of intellectual development   . GERD (gastroesophageal reflux disease)   . History of seizures   . Seizures (HCC)    no seizure activity seen in over a year    Patient Active Problem List   Diagnosis Date Noted  . BRBPR (bright red blood per rectum) 10/28/2019  . Hemorrhoids 10/28/2019    History reviewed. No pertinent surgical history.     Family History  Problem Relation Age of Onset  . Mental illness Mother     Social History   Tobacco Use  . Smoking status: Never Smoker  . Smokeless tobacco: Never Used  Vaping Use  . Vaping Use: Never used  Substance Use Topics  . Alcohol use: Not Currently  . Drug use: Not Currently    Home Medications Prior to Admission medications   Medication Sig Start Date End Date Taking? Authorizing Provider  ARIPiprazole (ABILIFY) 10 MG tablet Take 10 mg by mouth daily.    [provider]   Cholecalciferol (VITAMIN D3) 20 MCG (800 UNIT) TABS Take 1 tablet by mouth once.    [provider]  famotidine (PEPCID) 20 MG tablet Take 20 mg by mouth 2 (two) times daily.    [provider]  traZODone (DESYREL) 100 MG tablet Take 100 mg by mouth every evening.    [provider]    Allergies    Lactose  Review of Systems   Review of Systems  Unable to perform ROS: Patient nonverbal    Physical Exam Updated Vital Signs BP 119/71 (BP Location: Right Arm)   Temp 97.9 F (36.6 C) (Axillary)   Resp 17   SpO2 100%   Physical Exam Vitals and nursing note reviewed.  Constitutional:      General: He is not in acute distress.    Comments: Patient with eyes closed, arousable to painful stimuli. Thin appearing.    HENT:     Head: Normocephalic. No raccoon eyes or Battle's sign.      Right Ear: No hemotympanum.     Left Ear: No hemotympanum.     Nose:     Comments: Dried blood present in nares. No septal hematoma. No active epistaxis. No nasal bone tenderness to palpation.     Mouth/Throat:     Comments: No obvious visible dental/intra-oral injuries.  Eyes:  Pupils: Pupils are equal, round, and reactive to light.  Neck:     Comments: C-collar in place. NO point/focal vertebral tenderness or palpable step off.  Cardiovascular:     Rate and Rhythm: Normal rate and regular rhythm.     Pulses:          Radial pulses are 2+ on the right side and 2+ on the left side.       Posterior tibial pulses are 2+ on the right side and 2+ on the left side.  Pulmonary:     Effort: Pulmonary effort is normal.     Breath sounds: Normal breath sounds.  Chest:     Chest wall: No tenderness.  Abdominal:     General: There is no distension.     Palpations: Abdomen is soft.     Tenderness: There is no abdominal tenderness. There is no guarding or rebound.  Musculoskeletal:     Comments:  Upper/lower extremities: Patient holds his arms with elbows flexed & hands in  clenched position below his chin. I am able to passively range all major joints of the extremities without significant pain noted, however patient is very stiff throughout.  There is no focal areas of bony tenderness Back: decubitus ulcers noted to patient's back- do not appear grossly infected. No midline tenderness or palpable step-off.  Skin:    General: Skin is warm and dry.  Neurological:     Comments: Contractures noted in extremities.  Not following commands limiting neuro exam.      ED Results / Procedures / Treatments   Labs (all labs ordered are listed, but only abnormal results are displayed) Labs Reviewed  BASIC METABOLIC PANEL - Abnormal; Notable for the following components:      Result Value   Glucose, Bld 138 (*)    BUN 41 (*)    Anion gap 16 (*)    All other components within normal limits  CBC - Abnormal; Notable for the following components:   RBC 4.15 (*)    nRBC 0.5 (*)    All other components within normal limits  CBG MONITORING, ED - Abnormal; Notable for the following components:   Glucose-Capillary 147 (*)    All other components within normal limits  RESP PANEL BY RT-PCR (FLU A&B, COVID) [id number]  URINE CULTURE  URINALYSIS, ROUTINE W REFLEX MICROSCOPIC  CK  HEPATIC FUNCTION PANEL  LACTIC ACID, PLASMA  LACTIC ACID, PLASMA    EKG None  Radiology CT Head Wo Contrast  Result Date: 07/01/2020 CLINICAL DATA:  Larey Seat in shower EXAM: CT HEAD WITHOUT CONTRAST TECHNIQUE: Contiguous axial images were obtained from the base of the skull through the vertex without intravenous contrast. COMPARISON:  None. FINDINGS: Brain: No acute territorial infarction, hemorrhage, or intracranial mass. The ventricles are nonenlarged. Vascular: No hyperdense vessels.  No unexpected calcification Skull: No fracture Sinuses/Orbits: No acute finding. Other: Generalized subcutaneous and soft tissue edema within the scalp and suboccipital soft tissues. IMPRESSION: 1. No CT evidence for  acute intracranial abnormality. Electronically Signed   By: Jasmine Pang M.D.   On: 07/01/2020 23:28   CT Cervical Spine Wo Contrast  Result Date: 07/01/2020 CLINICAL DATA:  Larey Seat in shower EXAM: CT CERVICAL SPINE WITHOUT CONTRAST TECHNIQUE: Multidetector CT imaging of the cervical spine was performed without intravenous contrast. Multiplanar CT image reconstructions were also generated. COMPARISON:  None. FINDINGS: Alignment: Reversal of cervical lordosis. 3 mm anterolisthesis C3 on C4, potentially due to posterior facet degenerative change. Skull base and  vertebrae: No acute fracture. No primary bone lesion or focal pathologic process. Soft tissues and spinal canal: No prevertebral fluid or swelling. No visible canal hematoma. Disc levels: Mild diffuse degenerative changes at C4-C5, C5-C6 and C6-C7. Facet degeneration at multiple levels, most notable at C3-C4. Upper chest: Negative. Other: Poorly defined soft tissue planes, likely due to absence of significant subcutaneous fat. IMPRESSION: Reversal of cervical lordosis with 3 mm anterolisthesis C3 on C4, potentially due to posterior facet degenerative change. No fracture identified. Electronically Signed   By: Jasmine Pang M.D.   On: 07/01/2020 23:34   DG Pelvis Portable  Result Date: 07/01/2020 CLINICAL DATA:  Status post fall. EXAM: PORTABLE PELVIS 1-2 VIEWS COMPARISON:  None. FINDINGS: There is no evidence of pelvic fracture or diastasis. No pelvic bone lesions are seen. Mild degenerative changes are seen involving both hips, in the form of joint space narrowing and acetabular sclerosis. IMPRESSION: No acute osseous abnormality. Electronically Signed   By: Aram Candela M.D.   On: 07/01/2020 23:49   DG Chest Portable 1 View  Result Date: 07/01/2020 CLINICAL DATA:  Status post fall. EXAM: PORTABLE CHEST 1 VIEW COMPARISON:  None. FINDINGS: The heart size and mediastinal contours are within normal limits. Both lungs are clear. The visualized  skeletal structures are unremarkable. IMPRESSION: No active disease. Electronically Signed   By: Aram Candela M.D.   On: 07/01/2020 23:50    Procedures Procedures (including critical care time)  Medications Ordered in ED Medications - No data to display  ED Course  I have reviewed the triage vital signs and the nursing notes.  Pertinent labs & imaging results that were available during my care of the patient were reviewed by me and considered in my medical decision making (see chart for details).    MDM Rules/Calculators/A&P                          Patient presents to the emergency department after suspected fall which was unwitnessed.  Hx provided by his caregiver who relates that at baseline he is awake, follows commands, and is ambulatory- however upon my evaluation of the patient I am quite doubtful of his ability to ambulate independently as his caregiver states given his contractures, decubitus ulcers and very thin appearance.  On emergency department arrival patient is quite drowsy, arousable to painful stimuli, is protecting his airway.  Fall versus seizure versus syncope at this time.  CT head and C-spine ordered, will also obtain portable chest/pelvis x-rays.  CBG and basic labs to be obtained.  Additional history obtained:  Additional history obtained from patient's care giver & chart review. Lab Tests:  I Ordered, reviewed, and interpreted labs, which included:  CBG: 147 CBC: Fairly unremarkable.  BMP: BUN & creatinine elevated compared to prior, especially BUN @ 41. Gap is mildly elevated @ 16.   Imaging Studies ordered:  I ordered imaging studies which included CT head & cspine as well as chest/pelvis x-rays, I independently visualized and interpreted imaging which showed no acute injuries noted, specifically no head bleed.   ED Course:  Remains with decreased responsiveness, cannot sit up/ambulate independently or follow command as his caregiver states he can at  baseline, again suspicious of his true baseline status, with hx of seizures considering possible prolonged post ictal period. Will discuss w/ neurology.   04:22: CONSULT: Discussed with neurologist Dr. Thomasena Edis- will come see patient in the ED. Appreciate consultation.   04:45: RE-EVAL: Nursing staff  obtained COVID swab, attempted in and out cath- dry, patient's eyes are open now, but he remains unable to follow commands.   I rediscussed with Dr. Thomasena Edisollins with neurology who also has a low suspicion that patient ambulates at baseline also doubtful of his history of seizures based on chart review and exam, recommends EEG, MRI, UA, urine culture, CK, and lactic acid.  Patient would benefit from PT/OT consult as well as social work involvement.  I am in agreement with this and appreciate consultation.  These test have been ordered.  We will discuss with medicine for admission.   07:28: CONSULT: Discussed with IM residency service- accept admission.   Portions of this note were generated with Scientist, clinical (histocompatibility and immunogenetics)Dragon dictation software. Dictation errors may occur despite best attempts at proofreading.  Final Clinical Impression(s) / ED Diagnoses Final diagnoses:  Fall, initial encounter  Altered mental status, unspecified altered mental status type    Rx / DC Orders ED Discharge Orders    None       Cherly Andersonetrucelli, Ladora Osterberg R, PA-C 07/02/20 0729    Nira Connardama, Pedro Eduardo, MD 07/03/20 [id number]

## 2020-07-01 NOTE — ED Provider Notes (Incomplete)
[facility] EMERGENCY DEPARTMENT Provider Note   CSN: [id number] Arrival date & time: 07/01/20  2248     History Chief Complaint  Patient presents with  . Fall    Andres [name] is a 39 y.o. male.with a hx of autism, seizures, GERD, & depression who is non-verbal at baseline presents to the ED via EMS S/p fall this evening shortly PTA. I called & spoke with [name], patient's care giver, who provided history- she relays that patient fell while in the shower, this was unwitnessed, he was not on the floor for long, she is unsure if he hit his head. He has been leaning to the left and drooling a bit more than usual S/p fall. Did not seem to have pain in a specific location for her. She states that at baseline he is able to ambulate, is awake, and can follow commands, however is non-verbal. Level 5 caveat applies secondary to nonverbal patient.   HPI     Past Medical History:  Diagnosis Date  . Autism   . Depression   . Disorder of intellectual development   . GERD (gastroesophageal reflux disease)   . History of seizures   . Seizures (HCC)    no seizure activity seen in over a year    Patient Active Problem List   Diagnosis Date Noted  . BRBPR (bright red blood per rectum) 10/28/2019  . Hemorrhoids 10/28/2019    History reviewed. No pertinent surgical history.     Family History  Problem Relation Age of Onset  . Mental illness Mother     Social History   Tobacco Use  . Smoking status: Never Smoker  . Smokeless tobacco: Never Used  Vaping Use  . Vaping Use: Never used  Substance Use Topics  . Alcohol use: Not Currently  . Drug use: Not Currently    Home Medications Prior to Admission medications   Medication Sig Start Date End Date Taking? Authorizing Provider  ARIPiprazole (ABILIFY) 10 MG tablet Take 10 mg by mouth daily.    [provider]  Cholecalciferol (VITAMIN D3) 20 MCG (800 UNIT) TABS Take 1 tablet by mouth once.     [provider]  famotidine (PEPCID) 20 MG tablet Take 20 mg by mouth 2 (two) times daily.    [provider]  traZODone (DESYREL) 100 MG tablet Take 100 mg by mouth every evening.    [provider]    Allergies    Lactose  Review of Systems   Review of Systems  Physical Exam Updated Vital Signs BP 119/71 (BP Location: Right Arm)   Temp 97.9 F (36.6 C) (Axillary)   Resp 17   SpO2 100%   Physical Exam  ED Results / Procedures / Treatments   Labs (all labs ordered are listed, but only abnormal results are displayed) Labs Reviewed - No data to display  EKG None  Radiology CT Head Wo Contrast  Result Date: 07/01/2020 CLINICAL DATA:  Larey Seat in shower EXAM: CT HEAD WITHOUT CONTRAST TECHNIQUE: Contiguous axial images were obtained from the base of the skull through the vertex without intravenous contrast. COMPARISON:  None. FINDINGS: Brain: No acute territorial infarction, hemorrhage, or intracranial mass. The ventricles are nonenlarged. Vascular: No hyperdense vessels.  No unexpected calcification Skull: No fracture Sinuses/Orbits: No acute finding. Other: Generalized subcutaneous and soft tissue edema within the scalp and suboccipital soft tissues. IMPRESSION: 1. No CT evidence for acute intracranial abnormality. Electronically Signed   By: Adrian Prows.D.  On: 07/01/2020 23:28    Procedures Procedures (including critical care time)  Medications Ordered in ED Medications - No data to display  ED Course  I have reviewed the triage vital signs and the nursing notes.  Pertinent labs & imaging results that were available during my care of the patient were reviewed by me and considered in my medical decision making (see chart for details).    MDM Rules/Calculators/A&P                          *** Final Clinical Impression(s) / ED Diagnoses Final diagnoses:  None    Rx / DC Orders ED Discharge Orders    None

## 2020-07-01 NOTE — ED Triage Notes (Signed)
Per ems pt from home w/ care giver. Care giver reports patient fell in the shower. States he is normally able to get around on his own. Pt is non verbal at baseline. Care giver states no obvious injuries other than drooling and leaning his heard that started after the fall. Pt not on blood thinners.

## 2020-07-02 ENCOUNTER — Inpatient Hospital Stay (HOSPITAL_COMMUNITY): Payer: Medicare Other

## 2020-07-02 DIAGNOSIS — R7401 Elevation of levels of liver transaminase levels: Secondary | ICD-10-CM | POA: Diagnosis not present

## 2020-07-02 DIAGNOSIS — Z515 Encounter for palliative care: Secondary | ICD-10-CM | POA: Diagnosis not present

## 2020-07-02 DIAGNOSIS — R790 Abnormal level of blood mineral: Secondary | ICD-10-CM | POA: Diagnosis not present

## 2020-07-02 DIAGNOSIS — J189 Pneumonia, unspecified organism: Secondary | ICD-10-CM | POA: Diagnosis not present

## 2020-07-02 DIAGNOSIS — I96 Gangrene, not elsewhere classified: Secondary | ICD-10-CM | POA: Diagnosis not present

## 2020-07-02 DIAGNOSIS — I313 Pericardial effusion (noninflammatory): Secondary | ICD-10-CM | POA: Diagnosis not present

## 2020-07-02 DIAGNOSIS — G8250 Quadriplegia, unspecified: Secondary | ICD-10-CM | POA: Diagnosis present

## 2020-07-02 DIAGNOSIS — W182XXA Fall in (into) shower or empty bathtub, initial encounter: Secondary | ICD-10-CM | POA: Diagnosis present

## 2020-07-02 DIAGNOSIS — K31819 Angiodysplasia of stomach and duodenum without bleeding: Secondary | ICD-10-CM | POA: Diagnosis not present

## 2020-07-02 DIAGNOSIS — K5989 Other specified functional intestinal disorders: Secondary | ICD-10-CM | POA: Diagnosis not present

## 2020-07-02 DIAGNOSIS — K567 Ileus, unspecified: Secondary | ICD-10-CM | POA: Diagnosis not present

## 2020-07-02 DIAGNOSIS — G9340 Encephalopathy, unspecified: Secondary | ICD-10-CM | POA: Diagnosis not present

## 2020-07-02 DIAGNOSIS — N390 Urinary tract infection, site not specified: Secondary | ICD-10-CM | POA: Diagnosis present

## 2020-07-02 DIAGNOSIS — E878 Other disorders of electrolyte and fluid balance, not elsewhere classified: Secondary | ICD-10-CM | POA: Diagnosis not present

## 2020-07-02 DIAGNOSIS — R4701 Aphasia: Secondary | ICD-10-CM

## 2020-07-02 DIAGNOSIS — J90 Pleural effusion, not elsewhere classified: Secondary | ICD-10-CM | POA: Diagnosis not present

## 2020-07-02 DIAGNOSIS — I361 Nonrheumatic tricuspid (valve) insufficiency: Secondary | ICD-10-CM | POA: Diagnosis not present

## 2020-07-02 DIAGNOSIS — M24562 Contracture, left knee: Secondary | ICD-10-CM | POA: Diagnosis not present

## 2020-07-02 DIAGNOSIS — K5981 Ogilvie syndrome: Secondary | ICD-10-CM | POA: Diagnosis not present

## 2020-07-02 DIAGNOSIS — Z9889 Other specified postprocedural states: Secondary | ICD-10-CM | POA: Diagnosis not present

## 2020-07-02 DIAGNOSIS — J869 Pyothorax without fistula: Secondary | ICD-10-CM | POA: Diagnosis present

## 2020-07-02 DIAGNOSIS — Z681 Body mass index (BMI) 19 or less, adult: Secondary | ICD-10-CM | POA: Diagnosis not present

## 2020-07-02 DIAGNOSIS — I639 Cerebral infarction, unspecified: Secondary | ICD-10-CM | POA: Diagnosis not present

## 2020-07-02 DIAGNOSIS — M6249 Contracture of muscle, multiple sites: Secondary | ICD-10-CM | POA: Diagnosis not present

## 2020-07-02 DIAGNOSIS — W19XXXA Unspecified fall, initial encounter: Secondary | ICD-10-CM | POA: Diagnosis present

## 2020-07-02 DIAGNOSIS — E43 Unspecified severe protein-calorie malnutrition: Secondary | ICD-10-CM | POA: Diagnosis present

## 2020-07-02 DIAGNOSIS — L89159 Pressure ulcer of sacral region, unspecified stage: Secondary | ICD-10-CM | POA: Diagnosis not present

## 2020-07-02 DIAGNOSIS — R2990 Unspecified symptoms and signs involving the nervous system: Secondary | ICD-10-CM | POA: Diagnosis not present

## 2020-07-02 DIAGNOSIS — F819 Developmental disorder of scholastic skills, unspecified: Secondary | ICD-10-CM

## 2020-07-02 DIAGNOSIS — R64 Cachexia: Secondary | ICD-10-CM

## 2020-07-02 DIAGNOSIS — F840 Autistic disorder: Secondary | ICD-10-CM | POA: Diagnosis present

## 2020-07-02 DIAGNOSIS — N179 Acute kidney failure, unspecified: Secondary | ICD-10-CM | POA: Diagnosis not present

## 2020-07-02 DIAGNOSIS — K56609 Unspecified intestinal obstruction, unspecified as to partial versus complete obstruction: Secondary | ICD-10-CM | POA: Diagnosis not present

## 2020-07-02 DIAGNOSIS — M24561 Contracture, right knee: Secondary | ICD-10-CM | POA: Diagnosis not present

## 2020-07-02 DIAGNOSIS — J168 Pneumonia due to other specified infectious organisms: Secondary | ICD-10-CM | POA: Diagnosis not present

## 2020-07-02 DIAGNOSIS — Z20822 Contact with and (suspected) exposure to covid-19: Secondary | ICD-10-CM | POA: Diagnosis present

## 2020-07-02 DIAGNOSIS — R7989 Other specified abnormal findings of blood chemistry: Secondary | ICD-10-CM | POA: Diagnosis not present

## 2020-07-02 DIAGNOSIS — R188 Other ascites: Secondary | ICD-10-CM | POA: Diagnosis not present

## 2020-07-02 DIAGNOSIS — R4182 Altered mental status, unspecified: Secondary | ICD-10-CM | POA: Diagnosis not present

## 2020-07-02 DIAGNOSIS — R252 Cramp and spasm: Secondary | ICD-10-CM | POA: Diagnosis not present

## 2020-07-02 DIAGNOSIS — D62 Acute posthemorrhagic anemia: Secondary | ICD-10-CM | POA: Diagnosis present

## 2020-07-02 DIAGNOSIS — R531 Weakness: Secondary | ICD-10-CM | POA: Diagnosis present

## 2020-07-02 DIAGNOSIS — D649 Anemia, unspecified: Secondary | ICD-10-CM | POA: Diagnosis not present

## 2020-07-02 DIAGNOSIS — K7200 Acute and subacute hepatic failure without coma: Secondary | ICD-10-CM | POA: Diagnosis not present

## 2020-07-02 DIAGNOSIS — R7881 Bacteremia: Secondary | ICD-10-CM | POA: Diagnosis not present

## 2020-07-02 DIAGNOSIS — K219 Gastro-esophageal reflux disease without esophagitis: Secondary | ICD-10-CM | POA: Diagnosis not present

## 2020-07-02 DIAGNOSIS — A4151 Sepsis due to Escherichia coli [E. coli]: Secondary | ICD-10-CM | POA: Diagnosis not present

## 2020-07-02 DIAGNOSIS — K2100 Gastro-esophageal reflux disease with esophagitis, without bleeding: Secondary | ICD-10-CM | POA: Diagnosis not present

## 2020-07-02 DIAGNOSIS — Z7189 Other specified counseling: Secondary | ICD-10-CM | POA: Diagnosis not present

## 2020-07-02 DIAGNOSIS — Z66 Do not resuscitate: Secondary | ICD-10-CM | POA: Diagnosis not present

## 2020-07-02 DIAGNOSIS — R627 Adult failure to thrive: Secondary | ICD-10-CM | POA: Diagnosis not present

## 2020-07-02 DIAGNOSIS — J9811 Atelectasis: Secondary | ICD-10-CM | POA: Diagnosis not present

## 2020-07-02 DIAGNOSIS — I340 Nonrheumatic mitral (valve) insufficiency: Secondary | ICD-10-CM | POA: Diagnosis not present

## 2020-07-02 DIAGNOSIS — R609 Edema, unspecified: Secondary | ICD-10-CM | POA: Diagnosis not present

## 2020-07-02 DIAGNOSIS — E871 Hypo-osmolality and hyponatremia: Secondary | ICD-10-CM | POA: Diagnosis not present

## 2020-07-02 DIAGNOSIS — J918 Pleural effusion in other conditions classified elsewhere: Secondary | ICD-10-CM | POA: Diagnosis not present

## 2020-07-02 DIAGNOSIS — R6250 Unspecified lack of expected normal physiological development in childhood: Secondary | ICD-10-CM | POA: Diagnosis not present

## 2020-07-02 DIAGNOSIS — I472 Ventricular tachycardia: Secondary | ICD-10-CM | POA: Diagnosis not present

## 2020-07-02 DIAGNOSIS — B9620 Unspecified Escherichia coli [E. coli] as the cause of diseases classified elsewhere: Secondary | ICD-10-CM | POA: Diagnosis not present

## 2020-07-02 DIAGNOSIS — Q860 Fetal alcohol syndrome (dysmorphic): Secondary | ICD-10-CM

## 2020-07-02 DIAGNOSIS — K31811 Angiodysplasia of stomach and duodenum with bleeding: Secondary | ICD-10-CM | POA: Diagnosis present

## 2020-07-02 DIAGNOSIS — R600 Localized edema: Secondary | ICD-10-CM | POA: Diagnosis not present

## 2020-07-02 DIAGNOSIS — M62462 Contracture of muscle, left lower leg: Secondary | ICD-10-CM | POA: Diagnosis not present

## 2020-07-02 DIAGNOSIS — R197 Diarrhea, unspecified: Secondary | ICD-10-CM | POA: Diagnosis not present

## 2020-07-02 DIAGNOSIS — Y93E1 Activity, personal bathing and showering: Secondary | ICD-10-CM | POA: Diagnosis not present

## 2020-07-02 DIAGNOSIS — R509 Fever, unspecified: Secondary | ICD-10-CM | POA: Diagnosis not present

## 2020-07-02 DIAGNOSIS — Z87898 Personal history of other specified conditions: Secondary | ICD-10-CM

## 2020-07-02 LAB — RESP PANEL BY RT-PCR (FLU A&B, COVID) ARPGX2
Influenza A by PCR: NEGATIVE
Influenza B by PCR: NEGATIVE
SARS Coronavirus 2 by RT PCR: NEGATIVE

## 2020-07-02 LAB — HEPATITIS B SURFACE ANTIGEN: Hepatitis B Surface Ag: NONREACTIVE

## 2020-07-02 LAB — CBC
HCT: 40.6 % (ref 39.0–52.0)
Hemoglobin: 13.4 g/dL (ref 13.0–17.0)
MCH: 32.3 pg (ref 26.0–34.0)
MCHC: 33 g/dL (ref 30.0–36.0)
MCV: 97.8 fL (ref 80.0–100.0)
Platelets: 225 10*3/uL (ref 150–400)
RBC: 4.15 MIL/uL — ABNORMAL LOW (ref 4.22–5.81)
RDW: 13.6 % (ref 11.5–15.5)
WBC: 6.4 10*3/uL (ref 4.0–10.5)
nRBC: 0.5 % — ABNORMAL HIGH (ref 0.0–0.2)

## 2020-07-02 LAB — BASIC METABOLIC PANEL
Anion gap: 16 — ABNORMAL HIGH (ref 5–15)
BUN: 41 mg/dL — ABNORMAL HIGH (ref 6–20)
CO2: 27 mmol/L (ref 22–32)
Calcium: 9.5 mg/dL (ref 8.9–10.3)
Chloride: 101 mmol/L (ref 98–111)
Creatinine, Ser: 1.22 mg/dL (ref 0.61–1.24)
GFR, Estimated: >60 mL/min (ref >60)
Glucose, Bld: 138 mg/dL — ABNORMAL HIGH (ref 70–99)
Potassium: 3.7 mmol/L (ref 3.5–5.1)
Sodium: 144 mmol/L (ref 135–145)

## 2020-07-02 LAB — CBG MONITORING, ED: Glucose-Capillary: 147 mg/dL — ABNORMAL HIGH (ref 70–99)

## 2020-07-02 LAB — HEPATIC FUNCTION PANEL
ALT: 61 U/L — ABNORMAL HIGH (ref 0–44)
AST: 113 U/L — ABNORMAL HIGH (ref 15–41)
Albumin: 2.4 g/dL — ABNORMAL LOW (ref 3.5–5.0)
Alkaline Phosphatase: 83 U/L (ref 38–126)
Bilirubin, Direct: 0.3 mg/dL — ABNORMAL HIGH (ref 0.0–0.2)
Indirect Bilirubin: 0.5 mg/dL (ref 0.3–0.9)
Total Bilirubin: 0.8 mg/dL (ref 0.3–1.2)
Total Protein: 5.3 g/dL — ABNORMAL LOW (ref 6.5–8.1)

## 2020-07-02 LAB — HEPATITIS C ANTIBODY: HCV Ab: NONREACTIVE

## 2020-07-02 LAB — CK: Total CK: 2388 U/L — ABNORMAL HIGH (ref 49–397)

## 2020-07-02 LAB — HIV ANTIBODY (ROUTINE TESTING W REFLEX): HIV Screen 4th Generation wRfx: NONREACTIVE

## 2020-07-02 LAB — HEPATITIS B CORE ANTIBODY, IGM: Hep B C IgM: NONREACTIVE

## 2020-07-02 LAB — LACTIC ACID, PLASMA
Lactic Acid, Venous: 1.8 mmol/L (ref 0.5–1.9)
Lactic Acid, Venous: 1.7 mmol/L (ref 0.5–1.9)

## 2020-07-02 MED ORDER — TRAZODONE HCL 50 MG PO TABS
100.0000 mg | ORAL_TABLET | Freq: Every evening | ORAL | Status: DC
  Administered 2020-07-04: 100 mg via ORAL
  Filled 2020-07-02 (×3): qty 2

## 2020-07-02 MED ORDER — LACTATED RINGERS IV BOLUS
1000.0000 mL | Freq: Once | INTRAVENOUS | Status: AC
  Administered 2020-07-02: 1000 mL via INTRAVENOUS

## 2020-07-02 MED ORDER — ENOXAPARIN SODIUM 40 MG/0.4ML ~~LOC~~ SOLN
40.0000 mg | SUBCUTANEOUS | Status: DC
  Administered 2020-07-02 – 2020-07-04 (×3): 40 mg via SUBCUTANEOUS
  Filled 2020-07-02 (×3): qty 0.4

## 2020-07-02 MED ORDER — FAMOTIDINE 20 MG PO TABS
20.0000 mg | ORAL_TABLET | Freq: Two times a day (BID) | ORAL | Status: DC
  Administered 2020-07-03 – 2020-07-04 (×4): 20 mg via ORAL
  Filled 2020-07-02 (×5): qty 1

## 2020-07-02 MED ORDER — SODIUM CHLORIDE 0.9 % IV BOLUS
1000.0000 mL | Freq: Once | INTRAVENOUS | Status: AC
  Administered 2020-07-02: 1000 mL via INTRAVENOUS

## 2020-07-02 MED ORDER — ARIPIPRAZOLE 10 MG PO TABS
10.0000 mg | ORAL_TABLET | Freq: Every day | ORAL | Status: DC
  Administered 2020-07-03 – 2020-07-04 (×2): 10 mg via ORAL
  Filled 2020-07-02 (×3): qty 1

## 2020-07-02 NOTE — ED Notes (Signed)
Attempted IV access. Unsuccessful twice. Will place IV team consult.

## 2020-07-02 NOTE — ED Notes (Signed)
Attempted to ambulate patient. Pt unable to sit up unassisted. Pt unable to stand.

## 2020-07-02 NOTE — Consult Note (Addendum)
WOC Nurse Consult Note: Patient receiving care in [facility] [id number] Patient is contracted, eyes open but not verbally interactive.  Reason for Consult: Decubitus ulcer Wound type: Friction wounds to the mid upper back. One small PI to left lower margin of the scapula. Decubitus stage 2 with partial thickness.  Pressure Injury POA: Yes Wound bed: partial thickness upper back  Drainage (amount, consistency, odor) None Periwound: Intact Dressing procedure/placement/frequency: Apply xeroform gauze [place] # 295) to all areas of the upper back and secure with foam dressings. Foam dressing to the sacral wound. Peel down all sites with a foam dressing EACH shift.  Record your observations.  Change daily. Order and place patient on a standard air mattress. Place pillows between the legs at the knees and turn q2h.   Monitor the wound area(s) for worsening of condition such as: Signs/symptoms of infection, increase in size, development of or worsening of odor, development of pain, or increased pain at the affected locations.   Notify the medical team if any of these develop.  Thank you for the consult. WOC nurse will not follow at this time.   Please re-consult the WOC team if needed.  Renaldo Reel Katrinka Blazing, MSN, RN, CMSRN, Angus Seller, [facility] Wound Treatment Associate Pager [phone]

## 2020-07-02 NOTE — Progress Notes (Signed)
Pt has been agitated and has crawled out of bed. Pt is alert to self and can verbalize his first name only.  Pt up sitting in chair in his room. Pt is unsteady on his feet. Pt continues to state food and pepsi. Pt tray ordered and pt was able to feed himself. Pt was up at nursing station because he would not stay in bed. MD notified of pt behavior. MD made aware of pt pulling out his only access. MD wrote order to have a safety sitter for the pt. Staff will continue to monitor the patient.

## 2020-07-02 NOTE — H&P (Signed)
Date: 07/02/2020               Patient Name:  Andres [name] MRN: [id number]  DOB: 03/07/1981 Age / Sex: 39 y.o., male   PCP: Leilani Able, MD         Medical Service: Internal Medicine Teaching Service         Attending Physician: Dr. Reymundo Poll, MD    First Contact: Dr. Laddie Aquas Pager: [phone]  Second Contact: Dr. 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  History is obtained from chart review, ER providers and caregiver, Andres [name].  Andres [name] reports that around 9:30 PM last night, she heard a thud in the shower, and he appeared to have fallen. She felt like he was not himself afterwards, as he was slumped over, drooling, and not as responsive as normal which is what prompted her to call EMS. She says at baseline he can ambulate unassisted, bathe and dress on his own. He wears diapers, and she did not notice any changes in his bowel or bladder habits recently. She denies any other symptoms or changes in behavior prior to the fall.  He is mostly non-verbal, but can respond yes/no to some questions and follows commands. He eats a regular diet without any issues other than lactose intolerant. He spends Monday-Friday at a Daycare. He will reportedly "act out" and hit other people. At home, it is just Andres [name] and Andres [name], who has been caring for him for approximately 2 years.   Meds:  Abilify 10 mg daily  Famotidine 20 mg daily  Trazodone 100 mg qhs Hydroxyzine for agitation prn   Allergies: Allergies as of 07/01/2020 - Review Complete 07/01/2020   Allergen Reaction Noted  . Lactose Diarrhea 01/31/2015   Past Medical History:  Diagnosis Date  . Autism   . Depression   . Disorder of intellectual development   . GERD (gastroesophageal reflux disease)   . History of seizures   . Seizures (HCC)    no seizure activity seen in over a year    Family History: unable to obtain   Social History: lives at home with caregiver. Performs most ADLs on his own. No substance use.   Review of Systems: Review of Systems  Unable to perform ROS: Patient nonverbal   Physical Exam: Blood pressure 110/62, pulse 77, temperature 100 F (37.8 C), temperature source Rectal, resp. rate 15, SpO2 98 %. General: awake, cachectic and malnourished appearing, lying comfortably in bed Neck: supple, no JVD HEENT: [name]; no scleral injection, dry mucous membranes CV: RRR; no m/r/g Pulm: normal work of breathing on room air; lungs CTA in anterior fields Abd: BS+; abdomen soft, non-distended, non-tender Neuro: exam limited due to cognitive impairment, but is able to move all extremities and follow basic commands. He exhibits contractures in all 4 extremities with spastic tone throughout.  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 He was admitted for further work-up and management.   S/p Unwitnessed  Fall -films including portal CXR, pelvis x-ray, CT of cervical spine and head were negative for any traumatic injuries -lab work unremarkable thus far apart from elevated liver enzymes and evidence of mild dehydration  -neurology was consulted and recommended MRI, EEG and further infectious work-up  -U/A, lactic acid and blood and urine cultures pending -patient seems to be more responsive after receiving IVF; will give another liter and continue maintenance afterwards -despite caregiver's report, patient does not appear like he would be able to ambulate on his own and perform ADLs -PT/OT and TOC consults   Cognitive impairment with agitation secondary to autism and fetal alcohol syndrome -continue home abilify and trazodone for sleep   Elevated liver enzymes -mild elevation in AST and ALT -will obtain Hep B and C serologies -if persistently elevated despite IVF, could consider further work-up with RUQ u/s    DVT ppx: Lovenox Diet: Regular CODE: FULL   Dispo: Admit patient to Inpatient with expected length of stay greater than 2 midnights.  SignedBridget Hartshorn, DO 07/02/2020, 8:18 AM  Pager: [phone] After 5pm on weekdays and 1pm on weekends: On Call pager: [phone]

## 2020-07-02 NOTE — Progress Notes (Signed)
Arrived bedside for EEG  Pt not cooperative at present.  RN aware   Will  Attempt again in am

## 2020-07-02 NOTE — Consult Note (Signed)
Neurology Consult H&P  CC: unwitnessed fall with prolonged disorientation  History is obtained from: Chart  HPI: Andres [name] is a 39 y.o. male PMHx autism, fetal alcohol syndrome, severe cognitive delay, questionable remote seizure as infant, syncope, bradicardia and non-verbal at baseline purportedly had unwitnessed fall 07/01/2020 with prolonged disorientation.  Chart review: Per "[name], patient's care giver, who provided history- she relays that patient fell while in the shower, this was unwitnessed, he was not on the floor for long, she is unsure if he hit his head. He has been leaning to the left and drooling a bit more than usual S/p fall. Did not seem to have pain in a specific location for her. She states that at baseline he is able to ambulate, is awake, and can follow commands, however is non-verbal, he has been less interactive and has not ambulated since the fall. Level 5 caveat applies secondary to nonverbal patient."  ROS: Unable to assess due to severe cognitive delay.  Past Medical History:  Diagnosis Date  . Autism   . Depression   . Disorder of intellectual development   . GERD (gastroesophageal reflux disease)   . History of seizures   . Seizures (HCC)    no seizure activity seen in over a year    Family History  Problem Relation Age of Onset  . Mental illness Mother    Social History:  reports that he has never smoked. He has never used smokeless tobacco. He reports previous alcohol use. He reports previous drug use.  Prior to Admission medications   Medication Sig Start Date End Date Taking? Authorizing Provider  ARIPiprazole (ABILIFY) 10 MG tablet Take 10 mg by mouth daily.    [provider]  Cholecalciferol (VITAMIN D3) 20 MCG (800 UNIT) TABS Take 1 tablet by mouth once.    [provider]  famotidine (PEPCID) 20 MG tablet Take 20 mg by mouth 2 (two) times daily.    [provider]  traZODone (DESYREL) 100 MG tablet Take 100  mg by mouth every evening.    [provider]   Exam: Current vital signs: BP 108/75   Pulse 86   Temp 100 F (37.8 C) (Rectal)   Resp 17   SpO2 96%   Physical Exam  Constitutional: Cachectic and poorly nourished.  Psych: Affect appropriate to situation Eyes: No scleral injection HENT: No OP obstrucion Head: Normocephalic.  Cardiovascular: Normal rate and regular rhythm.  Respiratory: Effort normal and breath sounds normal to anterior ascultation GI: Soft.  No distension. There is no tenderness.  Skin: WDI  Neuro: Mental Status: Unable to assess due to severe cognitive delay. Non-verbal at baseline. Cranial Nerves: Blinks to confrontation, pupils equal, round. EOMI with assistance, VOR +.  Facial sensation is symmetric to temperature Facial movement is symmetric.  Hearing grossly intact to voice Motor: Tone is spastic throughout. Bulk is decreased. Poor effort and decreased strength all four extremities. Contractures at knee and ankles. Sensory: Sensation is symmetric to noxious stimulus with moan. Deep Tendon Reflexes: Brisk and symmetric. Plantars: Mute Cerebellar: Unable to assess due to severe cognitive delay.  I have reviewed labs in epic and the pertinent results are: Results for KAHNER, YANIK (MRN [id number]) as of 07/02/2020 04:45  Ref. Range 07/02/2020 01:25  Glucose Latest Ref Range: 70 - 99 mg/dL 683 (H)   I have reviewed the images obtained: NCT head showed No evidence for acute intracranial abnormality.  Assessment: Andres [name] is a 39 y.o. male PMHx autism,  fetal alcohol syndrome, severe cognitive delay, questionable remote seizure as infant, syncope, bradicardia had purported unwitnessed fall and remains confused. Non-verbal at baseline. Patient was obtunded on exam however attempted to follow commands. Based on exam and current state, it is difficult to believe the patient is ambulatory or able to perform ADLs independently.  Chart review  as far back as 2014 does not mention active seizure and there is only one note in 2014 referencing remote seizure - History of seizures is doubtful and the patient would benefit from further evaluation to rule out seizures so as to avoid unnecessarily starting seizure medication that would further compromise cognition.  His labs thus far are unremarkable and history of cognitive delay, recurrent syncope with falls and remote bradycardia confound definitive diagnosis.    Plan: - 3T MRI brain without contrast. - Routine EEG to eval for any epileptogenic discharges. - Recommend PT/OT consult. - He has a decubitus ulcer stage 2-3 and recommend metabolic/infectious workup with UA with UCx, CK, serum lactate. - He would also benefit from social work.     Electronically signed by: Dr. Marisue Humble Pager: [phone] 07/02/2020, 6:26 AM

## 2020-07-02 NOTE — Progress Notes (Signed)
Tech attempted to get pt over to lab explained exam can not be done in hallway. Per RN try back later pt very altered/ aggitated right now pulling at ECG leads and all over the place. Will try back when schedule permits or when pt gets a room and 2 techs are available.

## 2020-07-02 NOTE — ED Notes (Signed)
Attempted to straight cath patient for UA. Pt has no urine in the bladder to obtain. Upon placing gown on patient, patient found to have 2 stage 1 ulcers on his back. Provider aware. IV team at bedside to place IV.

## 2020-07-02 NOTE — Progress Notes (Signed)
Transport brought patient to MRI department.  When patient arrived he had a bowel movement in the bed.  Sent the patient back to the floor so they could clean him up.  Will attempt to bring patient back at a later time.

## 2020-07-02 NOTE — Progress Notes (Signed)
New Admission Note:  Arrival Method: via stretcher from [facility] ED Mental Orientation: Alert and oriented to self Telemetry: No orders for telemtry Assessment: Completed Skin: pt has abrasions on his upper back, abrasions on  bilateral upper and lower extremities. Bony prominences on both hips and a small area on his sacrum where the skin is broken. IV: No IV access. Pt pulled out his IV Pain: No signs or symptoms of pain Safety Measures: Safety Fall Prevention Plan was given, discussed. Admission: Completed 3W: Patient has been orientated to the room, unit and the staff. Family: There is no family representation at the bedside.  Orders have been reviewed and implemented. Will continue to monitor the patient. Call light has been placed within reach and bed alarm has been activated.   Kathrynn Humble, RN

## 2020-07-03 ENCOUNTER — Inpatient Hospital Stay (HOSPITAL_COMMUNITY): Payer: Medicare Other

## 2020-07-03 DIAGNOSIS — R7401 Elevation of levels of liver transaminase levels: Secondary | ICD-10-CM

## 2020-07-03 DIAGNOSIS — R4182 Altered mental status, unspecified: Secondary | ICD-10-CM

## 2020-07-03 DIAGNOSIS — L8990 Pressure ulcer of unspecified site, unspecified stage: Secondary | ICD-10-CM | POA: Insufficient documentation

## 2020-07-03 DIAGNOSIS — L89159 Pressure ulcer of sacral region, unspecified stage: Secondary | ICD-10-CM | POA: Diagnosis present

## 2020-07-03 LAB — COMPREHENSIVE METABOLIC PANEL
ALT: 72 U/L — ABNORMAL HIGH (ref 0–44)
AST: 116 U/L — ABNORMAL HIGH (ref 15–41)
Albumin: 2.6 g/dL — ABNORMAL LOW (ref 3.5–5.0)
Alkaline Phosphatase: 81 U/L (ref 38–126)
Anion gap: 12 (ref 5–15)
BUN: 51 mg/dL — ABNORMAL HIGH (ref 6–20)
CO2: 29 mmol/L (ref 22–32)
Calcium: 9 mg/dL (ref 8.9–10.3)
Chloride: 104 mmol/L (ref 98–111)
Creatinine, Ser: 1.06 mg/dL (ref 0.61–1.24)
GFR, Estimated: >60 mL/min (ref >60)
Glucose, Bld: 131 mg/dL — ABNORMAL HIGH (ref 70–99)
Potassium: 2.9 mmol/L — ABNORMAL LOW (ref 3.5–5.1)
Sodium: 145 mmol/L (ref 135–145)
Total Bilirubin: 0.7 mg/dL (ref 0.3–1.2)
Total Protein: 5.8 g/dL — ABNORMAL LOW (ref 6.5–8.1)

## 2020-07-03 LAB — URINALYSIS, ROUTINE W REFLEX MICROSCOPIC
Bilirubin Urine: NEGATIVE
Glucose, UA: NEGATIVE mg/dL
Ketones, ur: NEGATIVE mg/dL
Nitrite: NEGATIVE
Protein, ur: 30 mg/dL — AB
RBC / HPF: >50 RBC/hpf — ABNORMAL HIGH (ref 0–5)
Specific Gravity, Urine: 1.025 (ref 1.005–1.030)
pH: 6 (ref 5.0–8.0)

## 2020-07-03 LAB — CBC
HCT: 34.1 % — ABNORMAL LOW (ref 39.0–52.0)
Hemoglobin: 11.9 g/dL — ABNORMAL LOW (ref 13.0–17.0)
MCH: 32.8 pg (ref 26.0–34.0)
MCHC: 34.9 g/dL (ref 30.0–36.0)
MCV: 93.9 fL (ref 80.0–100.0)
Platelets: 226 10*3/uL (ref 150–400)
RBC: 3.63 MIL/uL — ABNORMAL LOW (ref 4.22–5.81)
RDW: 13.6 % (ref 11.5–15.5)
WBC: 5.1 10*3/uL (ref 4.0–10.5)
nRBC: 0 % (ref 0.0–0.2)

## 2020-07-03 LAB — HEMOGLOBIN A1C
Hgb A1c MFr Bld: 5.9 % — ABNORMAL HIGH (ref 4.8–5.6)
Mean Plasma Glucose: 122.63 mg/dL

## 2020-07-03 LAB — HEPATITIS A ANTIBODY, IGM: Hep A IgM: NONREACTIVE

## 2020-07-03 LAB — HEPATITIS B SURFACE ANTIBODY, QUANTITATIVE: Hep B S AB Quant (Post): <3.1 m[IU]/mL — ABNORMAL LOW (ref >9.9)

## 2020-07-03 LAB — CK: Total CK: 2152 U/L — ABNORMAL HIGH (ref 49–397)

## 2020-07-03 MED ORDER — LACTATED RINGERS IV BOLUS
1000.0000 mL | Freq: Once | INTRAVENOUS | Status: AC
  Administered 2020-07-03: 1000 mL via INTRAVENOUS

## 2020-07-03 MED ORDER — POTASSIUM CHLORIDE CRYS ER 20 MEQ PO TBCR
40.0000 meq | EXTENDED_RELEASE_TABLET | Freq: Once | ORAL | Status: AC
  Administered 2020-07-03: 40 meq via ORAL
  Filled 2020-07-03: qty 2

## 2020-07-03 MED ORDER — LORAZEPAM 2 MG/ML IJ SOLN
0.5000 mg | Freq: Once | INTRAMUSCULAR | Status: AC
  Administered 2020-07-03: 0.5 mg via INTRAVENOUS
  Filled 2020-07-03: qty 1

## 2020-07-03 MED ORDER — LORAZEPAM BOLUS VIA INFUSION
0.5000 mg | Freq: Once | INTRAVENOUS | Status: DC
  Filled 2020-07-03: qty 1

## 2020-07-03 NOTE — Evaluation (Signed)
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 He presented to the ED following an unwitnessed fall in the shower and change in his baseline following in that he was slumped over, drooling, and not as responsive. At baseline, he can ambulate unassisted and bathe and dress himself. During the day M-F he is at [facility]. CT negative for cervical fx or acute intracranial abnormality. Further PMH: seizures, GERD, and depression.  Clinical Impression  Pt presents with condition mentioned above and deficits mentioned below, see PT Problem List. Patient with cognitive deficits and unintelligible speech at baseline and unable to provide PLOF or home set-up. No caregiver present at bedside to provide this information, and did not answer phone when attempted to contact them. Conflicting information in med chart with one note stating patient was a LTC resident at an ALF. Another note states patient was from home with caregiver. Patient currently demonstrates need for Mod A and multimodal cues for initiation and sequencing bed mobility, Max A +2 for sit to stand transfers, and maxA +2 with bilat UE support to take 1-2 steps. He demonstrates an ability to follow 1-step verbal commands with <15% accuracy. Patient also combative and agitated hitting and trying to bite both PT and OT at time of eval. Patient appears very thin with multiple mepliex bandages on dorsal trunk. Flexor tone present in all 4 extremities including cervical neck, hands and feet. Unable to formally assess strength, coordination, and sensation but weakness detected based on necessity for assist with all functional mob. Inability to extend trunk, knees and cervical neck to  neutral to support upright position. Recommendation for SNF post d/c given current functional status and deficits. Will continue to follow acutely.    Follow Up Recommendations SNF;Supervision/Assistance - 24 hour    Equipment Recommendations  Other (comment) (unaware of available equipment at home)    Recommendations for Other Services       Precautions / Restrictions Precautions Precautions: Fall Precaution Comments: High Fall Risk; seizures Restrictions Weight Bearing Restrictions: No      Mobility  Bed Mobility Overal bed mobility: Needs Assistance Bed Mobility: Rolling;Sidelying to Sit;Sit to Supine Rolling: Mod assist Sidelying to sit: Max assist;+2 for physical assistance;+2 for safety/equipment   Sit to supine: Max assist;+2 for physical assistance   General bed mobility comments: Mod A for rolling to L. Patient rolls with BLE flexed at knees and hips in fetal position. Max A +2 for sidelying to sit and to supine at trunk and BLE.     Transfers Overall transfer level: Needs assistance Equipment used: 2 person hand held assist Transfers: Sit to/from Stand Sit to Stand: Max assist;+2 physical assistance;+2 safety/equipment         General transfer comment: Max A and HHA +2 with tactile cues at hips and trunk. Patient unable to extend knees and hips to neutral to support upright posture. Neck remained flexed despite cueing to lift head.    Ambulation/Gait Ambulation/Gait assistance: Max assist;+2 physical assistance;+2 safety/equipment Gait Distance (Feet): 1 Feet Assistive device: 2 person hand held assist Gait Pattern/deviations: Decreased stride length;Scissoring;Shuffle;Trunk flexed;Narrow base of support Gait velocity: decreased Gait velocity interpretation: <1.31 ft/sec, indicative of household ambulator General Gait Details: MaxAx2 with bilat UE support on therapist's, cuing pt  to shift weight and move legs posteriorly to step back to bed with physical  assistance at legs to move them. Trunk flexion and knee buckling increased with time.   Stairs            Wheelchair Mobility    Modified Rankin (Stroke Patients Only) Modified Rankin (Stroke Patients Only) Pre-Morbid Rankin Score: Moderately severe disability Modified Rankin: Severe disability     Balance Overall balance assessment: Needs assistance Sitting-balance support: No upper extremity supported;Feet supported Sitting balance-Leahy Scale: Poor Sitting balance - Comments: Bilat UE support with min/modA to maintain static sitting balance. Postural control: Posterior lean Standing balance support: Bilateral upper extremity supported Standing balance-Leahy Scale: Poor Standing balance comment: HHA +2 to maintain static standing balance. Max verbal and tactile cues and Max A +2 to shift weight bilaterally to take steps.                              Pertinent Vitals/Pain Pain Assessment: Faces Faces Pain Scale: Hurts a little bit Pain Location: General discomfort with movement. Patient also resistant to passive movement.  Pain Intervention(s): Limited activity within patient's tolerance;Monitored during session;Repositioned    Home Living Family/patient expects to be discharged to:: Unsure Living Arrangements: Other (Comment) (Caregivers) Available Help at Discharge: Skilled Nursing Facility                  Prior Function Level of Independence: Needs assistance         Comments: Caregiver not present at time of initial evaluation. Contradicting information in med chart. Will continue to assess. Attempted to contact caregiver via phone, but no answer.      Hand Dominance   Dominant Hand:  (Unknown)    Extremity/Trunk Assessment   Upper Extremity Assessment Upper Extremity Assessment: Defer to OT evaluation RUE Deficits / Details: Flexor tone throughout RUE Sensation:  (Difficult to assess) RUE Coordination: decreased fine motor;decreased  gross motor LUE Deficits / Details: Flexor tone throughout  LUE Sensation:  (Unable to assess) LUE Coordination: decreased fine motor;decreased gross motor    Lower Extremity Assessment Lower Extremity Assessment: RLE deficits/detail;LLE deficits/detail RLE Deficits / Details: Flexor tone throughout. Based on observation of functional mob MMT scores of grossly 3 to 4 RLE Sensation:  (unable to formally assess) RLE Coordination: decreased fine motor;decreased gross motor (unable to formally assess) LLE Deficits / Details: Flexor tone throughout. Based on observation of functional mob MMT scores of grossly 3 to 4 LLE Sensation:  (unable to formally assess) LLE Coordination: decreased fine motor;decreased gross motor (unable to formally assess)    Cervical / Trunk Assessment Cervical / Trunk Assessment: Kyphotic (Forward head)  Communication   Communication: Other (comment) (Patient non-verbal at baseline, answers some ye/no questions)  Cognition Arousal/Alertness: Awake/alert Behavior During Therapy: Agitated Overall Cognitive Status: No family/caregiver present to determine baseline cognitive functioning                                 General Comments: Patient with garbled, unintelligible speech presumed baseline. Patient able to follow 1-step verbal commands with 15% accuracy with cues for initiation.       General Comments General comments (skin integrity, edema, etc.): Multiple mepilex bandages to posterior trunk. Patient combative hitting both OT and PT during evaluation and attempting to bite therapists.    Exercises     Assessment/Plan    PT  Assessment Patient needs continued PT services  PT Problem List Decreased strength;Decreased activity tolerance;Decreased balance;Decreased mobility;Decreased coordination;Decreased cognition;Decreased knowledge of use of DME;Decreased safety awareness;Decreased knowledge of precautions;Decreased skin integrity        PT Treatment Interventions DME instruction;Gait training;Functional mobility training;Stair training;Therapeutic activities;Therapeutic exercise;Balance training;Neuromuscular re-education;Cognitive remediation;Patient/family education;Wheelchair mobility training    PT Goals (Current goals can be found in the Care Plan section)  Acute Rehab PT Goals Patient Stated Goal: Unable to state.  PT Goal Formulation: Patient unable to participate in goal setting Time For Goal Achievement: 07/17/20 Potential to Achieve Goals: Fair    Frequency Min 3X/week   Barriers to discharge        Co-evaluation PT/OT/SLP Co-Evaluation/Treatment: Yes Reason for Co-Treatment: Complexity of the patient's impairments (multi-system involvement);Necessary to address cognition/behavior during functional activity;For patient/therapist safety;To address functional/ADL transfers PT goals addressed during session: Mobility/safety with mobility;Balance OT goals addressed during session: ADL's and self-care       AM-PAC PT "6 Clicks" Mobility  Outcome Measure Help needed turning from your back to your side while in a flat bed without using bedrails?: A Lot Help needed moving from lying on your back to sitting on the side of a flat bed without using bedrails?: A Lot Help needed moving to and from a bed to a chair (including a wheelchair)?: A Lot Help needed standing up from a chair using your arms (e.g., wheelchair or bedside chair)?: A Lot Help needed to walk in hospital room?: Total Help needed climbing 3-5 steps with a railing? : Total 6 Click Score: 10    End of Session Equipment Utilized During Treatment: Gait belt Activity Tolerance: Treatment limited secondary to agitation;Patient limited by fatigue Patient left: in bed;with nursing/sitter in room (sitter requested bed alarm off and call bell to side) Nurse Communication: Mobility status PT Visit Diagnosis: Unsteadiness on feet (R26.81);Other  abnormalities of gait and mobility (R26.89);History of falling (Z91.81);Muscle weakness (generalized) (M62.81);Difficulty in walking, not elsewhere classified (R26.2)    Time: 7616-0737 PT Time Calculation (min) (ACUTE ONLY): 20 min   Charges:   PT Evaluation $PT Eval High Complexity: 1 High          Raymond Gurney, PT, DPT Acute Rehabilitation Services  Pager: [phone] Office: [phone]   Jewel Baize 07/03/2020, 3:09 PM

## 2020-07-03 NOTE — Progress Notes (Signed)
Subjective: Appears similar to what is described as his baseline  Exam: Vitals:   07/03/20 1243 07/03/20 1524  BP: 106/66 (!) 97/59  Pulse: 62 94  Resp: 16 16  Temp: 98.3 F (36.8 C) 97.8 F (36.6 C)  SpO2:  97%   Gen: In bed, NAD Resp: non-labored breathing, no acute distress Abd: soft, nt  Neuro: MS: Awake, alert, answers yes/no questions.  When I asked him how he is doing he states "fine" CN: EOMI, blinks to threat bilaterally Motor: He has some spasticity, but moves all extremities spontaneously Sensory: Responds to stimulation x4  Pertinent Labs: Calcium 9.0 Creatinine 1.06  MRI is negative  Impression: 39 year old male with fall of unclear etiology, may be slightly confused after the fall.  From description of his baseline, it sounds like he is approaching this at this time.  No definite evidence of seizure activity, and therefore I would not start medication for seizure unless we have more definitive evidence that he is at risk.  Recommendations: 1) EEG, start seizure medicine only if positive otherwise no further recommendations.  Ritta Slot, MD [facility] [phone]  If 7pm- 7am, please page neurology on call as listed in [facility].

## 2020-07-03 NOTE — Progress Notes (Signed)
RN updated Andres [name] that the pt has been doing well. Pt has had 2 BM and has had 3 emesis. She stated pt will throw up after he has eating too much. RN let her know the pt had ate all of his food. She is aware pt was unable to get a CT scan done and suggested that we wait until she arrives. Pt is unable to stay flat for CT scan because he is comfortable in the fetal position. RN will pass on in shift change.

## 2020-07-03 NOTE — Evaluation (Signed)
Occupational Therapy Evaluation Patient Details Name: Andres [name] MRN: [id number] DOB: 1980/10/02 Today's Date: 07/03/2020    History of Present Illness 39 y.o. male presenting s/p unwhitnessed fall. CXR (-), XR pelvis (-), and CT head/spine (-) for acute findings. PMHx significant for fetal alcohol symdrome with cognitive impairment, Autism, non-verbal, and behaviors.    Clinical Impression   Patient with cognitive deficits and unintelligible speech at baseline and unable to provide PLOF or home set-up. No caregiver present at bedside to provide this information. Conflicting information in med chart with one note stating patient was a LTC resident at an ALF. Another note states patient was from home with caregiver. Patient currently demonstrates need for Mod A and multimodal cues for initiation and sequencing bed mobility, Max A +2 for sit to stand transfers, inability to ambulate with +2 assist, and ability to follow 1-step verbal commands with <15% accuracy. Patient also combative and agitated hitting both PT and OT at time of eval. Patient appears very thin with multiple mepliex bandages on dorsal trunk. Flexor tone present in all 4 extremities including cervical neck, hands and feet. Patient resistant to passive movement but did stand at EOB with Max A +2. Inability to extend trunk, knees and cervical neck to neutral to support upright position. Recommendation for SNF post d/c given CLOF and functional deficits requiring Total A grossly for self-care tasks. OT will continue to assess PLOF and home set-up.     Follow Up Recommendations  SNF    Equipment Recommendations  Other (comment) (Defer to next level of care)    Recommendations for Other Services       Precautions / Restrictions Precautions Precautions: Fall Precaution Comments: High Fall Risk Restrictions Weight Bearing Restrictions: No      Mobility Bed Mobility Overal bed mobility: Needs Assistance Bed Mobility:  Rolling;Sidelying to Sit Rolling: Mod assist Sidelying to sit: Max assist;+2 for physical assistance;+2 for safety/equipment       General bed mobility comments: Mod A for rolling to L. Patient rolls with BLE flexed at knees and hips in fetal position. Max A +2 for sidelying to sit at trunk and BLE.     Transfers Overall transfer level: Needs assistance Equipment used: 2 person hand held assist Transfers: Sit to/from Stand Sit to Stand: Max assist;+2 physical assistance;+2 safety/equipment         General transfer comment: Max A and HHA +2 with tactile cues at hips and trunk. Patient unable to extend knees and hips to neutral to support upright posture. Neck remained flexed despite cueing to lift head.      Balance Overall balance assessment: Needs assistance Sitting-balance support: No upper extremity supported;Feet supported Sitting balance-Leahy Scale: Poor   Postural control: Posterior lean Standing balance support: Bilateral upper extremity supported Standing balance-Leahy Scale: Poor Standing balance comment: HHA +2 to maintain static standing balance. Max verbal and tactile cues and Max A +2 to shift weight bilaterally to take steps forward.                            ADL either performed or assessed with clinical judgement   ADL Overall ADL's : Needs assistance/impaired     Grooming: Total assistance;Bed level   Upper Body Bathing: Total assistance;Bed level   Lower Body Bathing: Total assistance;Bed level   Upper Body Dressing : Total assistance;Bed level Upper Body Dressing Details (indicate cue type and reason): Patient declined donning gown. Prefers to be nude.  Lower Body Dressing: Total assistance;Bed level Lower Body Dressing Details (indicate cue type and reason): Total A to don footwear. Patient unable to extend BLE 2/2 flexor tone.    Toilet Transfer Details (indicate cue type and reason): Patient wears incontinence briefs at baseline.   Toileting- Clothing Manipulation and Hygiene: Total assistance       Functional mobility during ADLs: Total assistance;+2 for physical assistance;+2 for safety/equipment       Vision   Additional Comments: Unable to assess vision 2/2 cognitive deficits at baseline.     Perception     Praxis      Pertinent Vitals/Pain Pain Assessment: Faces Faces Pain Scale: Hurts a little bit Pain Location: General discomfort with movement. Patient also resistant to passive movement.  Pain Intervention(s): Monitored during session;Repositioned     Hand Dominance  (Unknown)   Extremity/Trunk Assessment Upper Extremity Assessment Upper Extremity Assessment: RUE deficits/detail;LUE deficits/detail RUE Deficits / Details: Flexor tone throughout RUE Sensation:  (Difficult to assess) RUE Coordination: decreased fine motor;decreased gross motor LUE Deficits / Details: Flexor tone throughout  LUE Sensation:  (Unable to assess) LUE Coordination: decreased fine motor;decreased gross motor   Lower Extremity Assessment Lower Extremity Assessment: Defer to PT evaluation   Cervical / Trunk Assessment Cervical / Trunk Assessment: Kyphotic (Forward head)   Communication Communication Communication: Other (comment) (Patient non-verbal at baseline. )   Cognition Arousal/Alertness: Awake/alert Behavior During Therapy: Agitated Overall Cognitive Status: No family/caregiver present to determine baseline cognitive functioning                                 General Comments: Patient with garbled, unintelligible speech presumed baseline. Patient able to follow 1-step verbal commands with 15% accuracy with cues for initiation.    General Comments  Multiple mepilex bandages to posterior trunk. Patient combative hitting both OT and PT during evaluation.     Exercises     Shoulder Instructions      Home Living Family/patient expects to be discharged to:: Unsure Living Arrangements:  Other (Comment) (Caregivers) Available Help at Discharge: Skilled Nursing Facility                                    Prior Functioning/Environment Level of Independence: Needs assistance        Comments: Caregiver not present at time of initial evaluation. Contradicting information in med chart. Will continue to assess.          OT Problem List: Decreased strength;Decreased range of motion;Decreased activity tolerance;Impaired balance (sitting and/or standing);Decreased coordination;Decreased safety awareness;Decreased knowledge of use of DME or AE;Impaired UE functional use      OT Treatment/Interventions: Self-care/ADL training;Therapeutic exercise;Energy conservation;DME and/or AE instruction;Therapeutic activities;Visual/perceptual remediation/compensation;Patient/family education;Balance training    OT Goals(Current goals can be found in the care plan section) Acute Rehab OT Goals Patient Stated Goal: Unable to state.  OT Goal Formulation: Patient unable to participate in goal setting Time For Goal Achievement: 07/17/20 Potential to Achieve Goals: Poor ADL Goals Pt Will Perform Eating: sitting;with mod assist Pt Will Perform Grooming: sitting;with mod assist Additional ADL Goal #1: Patient will tolerate EOB sitting against gravity for 5 minutes with stable vitals signs with Max A.  OT Frequency: Min 2X/week   Barriers to D/C:            Co-evaluation PT/OT/SLP Co-Evaluation/Treatment: Yes Reason for Co-Treatment:  Complexity of the patient's impairments (multi-system involvement);Necessary to address cognition/behavior during functional activity;For patient/therapist safety   OT goals addressed during session: ADL's and self-care      AM-PAC OT "6 Clicks" Daily Activity     Outcome Measure Help from another person eating meals?: A Lot Help from another person taking care of personal grooming?: Total Help from another person toileting, which includes  using toliet, bedpan, or urinal?: Total Help from another person bathing (including washing, rinsing, drying)?: Total Help from another person to put on and taking off regular upper body clothing?: Total Help from another person to put on and taking off regular lower body clothing?: Total 6 Click Score: 7   End of Session Equipment Utilized During Treatment: Gait belt Nurse Communication: Need for lift equipment (Maxi move)  Activity Tolerance: Treatment limited secondary to agitation Patient left: in bed;with nursing/sitter in room  OT Visit Diagnosis: Unsteadiness on feet (R26.81);Muscle weakness (generalized) (M62.81)                Time: 9470-9628 OT Time Calculation (min): 19 min Charges:  OT General Charges $OT Visit: 1 Visit OT Evaluation $OT Eval Moderate Complexity: 1 Mod  Thurl Boen H. OTR/L Supplemental OT, Department of rehab services [phone]  Tayson Schnelle R H.  07/03/2020, 2:04 PM

## 2020-07-03 NOTE — Progress Notes (Signed)
Attempted and I&O cath to obtain a urine sample per MD. Unsuccessful and blood was in the tube. Will notify MD.

## 2020-07-03 NOTE — Progress Notes (Signed)
EEG complete - results pending 

## 2020-07-03 NOTE — Progress Notes (Signed)
OT Cancellation Note  Patient Details Name: Andres [name] MRN: [id number] DOB: 06-03-1981   Cancelled Treatment:    Reason Eval/Treat Not Completed: Patient at procedure or test/ unavailable. Transport preparing patient for ultrasound. Will check back as time allows.   Kallie Edward OTR/L Supplemental OT, Department of rehab services [phone]  Armandina Iman R H. 07/03/2020, 9:58 AM

## 2020-07-03 NOTE — Progress Notes (Signed)
PT Cancellation Note  Patient Details Name: Andres [name] MRN: [id number] DOB: [birth year]   Cancelled Treatment:    Reason Eval/Treat Not Completed: Patient at procedure or test/unavailable. Pt getting ultrasound. Will follow-up as able.  Raymond Gurney, PT, DPT Acute Rehabilitation Services  Pager: [phone] Office: [phone]    Jewel Baize 07/03/2020, 10:22 AM

## 2020-07-03 NOTE — Plan of Care (Signed)

## 2020-07-03 NOTE — Progress Notes (Signed)
Subjective:   Andres [name] is able to answer yes and no to questions occasionally. Endorses feeling thirsty, requesting Pepsi. Says "peepee" when asked what is wrong although is unable to elaborate further. Denies fevers, chills, abdominal pain, pain on his back. Nursing report two watery bowel movements (not bloody) overnight and 3 episodes of non-bloody vomiting. Caretaker reported patient has this after eating large meals typically. Nursing report hematuria s/p attempting catheterization.   Unable to obtain further history due to chronic cognitive impairment / nonverbal status.  Objective:  Vital signs in last 24 hours: Vitals:   07/02/20 2328 07/03/20 0332 07/03/20 1243 07/03/20 1524  BP: 110/69 108/64 106/66 (!) 97/59  Pulse: 79 100 62 94  Resp: 18 18 16 16   Temp: 97.9 F (36.6 C) 98.5 F (36.9 C) 98.3 F (36.8 C) 97.8 F (36.6 C)  TempSrc: Oral Oral    SpO2: 94% 100%  97%   General: Patient is thin and lying in bed in seemingly uncomfortable position. Eyes: Sclera non-icteric. No conjunctival injection.  HENT: Dried blood in bilateral nares. No active epistaxis. Dry mucus membranes.  Respiratory: Unable to appreciate good lung sounds due to poor respiratory effort. Breathing comfortably on room air.  Cardiovascular: There is a 1/6 mid-systolic blowing murmur heard best along the left lower sternal border with possible S4 gallop. No lower extremity edema.  Neurological: Patient is awake and alert.  Musculoskeletal: Patient does not move extremities, which are in contractures with increased muscle tone and severe cachexia.  Abdominal: Firm although no obvious tenderness to palpation. Bowel sounds intact. No rebound or guarding. Skin: There are superficial abrasions along the mid-back and punctate superficial lesions along the sacrum. Psych: No agitation.   Assessment/Plan:  Active Problems:   Fall   Pressure injury of skin  # Change in Baseline s/p Unwitnessed Fall #  Mild Rhabdomyolysis  Patient's caretakers x 2 years report decrease in ADL's at home s/p unwitnessed fall while patient was showering. Patient continues to be able to answer yes/no, which is apparently his baseline in language, although does not move on exam with contractures, and usually is able to ambulate unassisted, dress self and follow commands. 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 May also be due to dehydration in the setting of watery bowel movements and multiple episodes of emesis as patient appears dry, requesting fluids. S/p 2L IVF in ED.  - Will give another L [facility]  - Neurology consulted; MRI unremarkable.  - Will check EEG to r/o seizure  - Continue home Abilify, Trazodone - Will get 1:1 observation  - PT/OT recommend SNF placement  - Blood cultures and urine cultures pending   # Hypokalemia Potassium down to 2.9.  - Given [facility] KCl - Continue to replace   # New Transaminitis AST 116, ALT 72 without known history of transaminitis or liver disease. May be medication induced due to Pepcid or Trazodone, although low dose. More likely acute with nausea, diarrhea. Consider Hep A. RUQ showed small gallbladder polyp but no acute findings. Hep B, HCV, HIV testing negative.  - Will check Hep A IgM  - Continue to trend daily CMP  # Pre-Diabetes Blood sugars elevated in hospital. Hb A1c is 5.9. - Continue to monitor blood sugars  # Gross Hematuria Urinalysis shows > 50 RBC's, large hemoglobin, protein 30, small leukocytes and few bacteria. Bladder scan does not show retention.  Most likely traumatic after attempted catheterization. - Continue to monitor for improvement   Prior to Admission Living Arrangement: Home Anticipated Discharge Location: Home  Barriers to Discharge: EEG Dispo: Anticipated discharge tomorrow if EEG unremarkable.   Glenford Bayley, MD 07/03/2020,  5:03 PM Pager: [phone] After 5pm on weekdays and 1pm on weekends: On Call pager [phone]

## 2020-07-03 NOTE — TOC Initial Note (Signed)
Transition of Care [facility]) - Initial/Assessment Note    Patient Details  Name: Andres [name] MRN: [id number] Date of Birth: 02-10-1981  [facility]) CM/SW Contact:    [name], RN Phone Number: 07/03/2020, 3:12 PM  Clinical Narrative:                 Pt is from an [facility] home where [name] takes care of him. She states he is not up much other than being changed. CM went over the recommendations from OT with her and she wants to come and see how he is doing to see if she is able to manage him still at the AFL.  [name] to see pt today and determine the plan when he is ready for d/c.  [name] does state the patient has a legal guardian that she will also reach out to.  TOC following.  Expected Discharge Plan: Skilled Nursing Facility Barriers to Discharge: Continued Medical Work up   Patient Goals and CMS Choice     Choice offered to / list presented to : [facility] POA / Guardian  Expected Discharge Plan and Services Expected Discharge Plan: Skilled Nursing Facility   Discharge Planning Services: CM Consult Post Acute Care Choice: Skilled Nursing Facility, Home Health Living arrangements for the past 2 months: Single Family Home                                      Prior Living Arrangements/Services Living arrangements for the past 2 months: Single Family Home Lives with:: Other (Comment) Patient language and need for interpreter reviewed:: Yes        Need for Family Participation in Patient Care: Yes (Comment) Care giver support system in place?: Yes (comment)   Criminal Activity/Legal Involvement Pertinent to Current Situation/Hospitalization: No - Comment as needed  Activities of Daily Living      Permission Sought/Granted                  Emotional Assessment Appearance:: Appears stated age         Psych Involvement: No (comment)  Admission diagnosis:  Fall [W19.XXXA] Fall, initial encounter [id number]] Altered mental status,  unspecified altered mental status type [R41.82] Patient Active Problem List   Diagnosis Date Noted  . Pressure injury of skin 07/03/2020  . Fall 07/02/2020  . BRBPR (bright red blood per rectum) 10/28/2019  . Hemorrhoids 10/28/2019   PCP:  Leilani Able, MD Pharmacy:   [facility] [address] [place]), [place] - [address] [place] [address] Phone: [phone] Fax: [phone]  [facility] - [place], [place] - [address] [place] [address] Phone: [phone] Fax: [phone]     Social Determinants of Health (SDOH) Interventions    Readmission Risk Interventions No flowsheet data found.

## 2020-07-04 ENCOUNTER — Inpatient Hospital Stay (HOSPITAL_COMMUNITY): Payer: Medicare Other

## 2020-07-04 DIAGNOSIS — R7401 Elevation of levels of liver transaminase levels: Secondary | ICD-10-CM | POA: Diagnosis not present

## 2020-07-04 DIAGNOSIS — R197 Diarrhea, unspecified: Secondary | ICD-10-CM | POA: Diagnosis not present

## 2020-07-04 LAB — GASTROINTESTINAL PANEL BY PCR, STOOL (REPLACES STOOL CULTURE)

## 2020-07-04 LAB — CBC
HCT: 36.3 % — ABNORMAL LOW (ref 39.0–52.0)
Hemoglobin: 11.7 g/dL — ABNORMAL LOW (ref 13.0–17.0)
MCH: 31.5 pg (ref 26.0–34.0)
MCHC: 32.2 g/dL (ref 30.0–36.0)
MCV: 97.6 fL (ref 80.0–100.0)
Platelets: 219 10*3/uL (ref 150–400)
RBC: 3.72 MIL/uL — ABNORMAL LOW (ref 4.22–5.81)
RDW: 13.7 % (ref 11.5–15.5)
WBC: 6.3 10*3/uL (ref 4.0–10.5)
nRBC: 0 % (ref 0.0–0.2)

## 2020-07-04 LAB — COMPREHENSIVE METABOLIC PANEL
ALT: 77 U/L — ABNORMAL HIGH (ref 0–44)
AST: 95 U/L — ABNORMAL HIGH (ref 15–41)
Albumin: 2.5 g/dL — ABNORMAL LOW (ref 3.5–5.0)
Alkaline Phosphatase: 80 U/L (ref 38–126)
Anion gap: 11 (ref 5–15)
BUN: 38 mg/dL — ABNORMAL HIGH (ref 6–20)
CO2: 27 mmol/L (ref 22–32)
Calcium: 9.4 mg/dL (ref 8.9–10.3)
Chloride: 106 mmol/L (ref 98–111)
Creatinine, Ser: 0.78 mg/dL (ref 0.61–1.24)
GFR, Estimated: >60 mL/min (ref >60)
Glucose, Bld: 128 mg/dL — ABNORMAL HIGH (ref 70–99)
Potassium: 3.3 mmol/L — ABNORMAL LOW (ref 3.5–5.1)
Sodium: 144 mmol/L (ref 135–145)
Total Bilirubin: 0.8 mg/dL (ref 0.3–1.2)
Total Protein: 5.9 g/dL — ABNORMAL LOW (ref 6.5–8.1)

## 2020-07-04 MED ORDER — LACTASE 3000 UNITS PO TABS
3000.0000 [IU] | ORAL_TABLET | Freq: Three times a day (TID) | ORAL | Status: DC | PRN
  Administered 2020-07-04: 3000 [IU] via ORAL
  Filled 2020-07-04 (×4): qty 1

## 2020-07-04 MED ORDER — LACTASE 3000 UNITS PO TABS
3000.0000 [IU] | ORAL_TABLET | Freq: Once | ORAL | Status: DC
  Filled 2020-07-04: qty 1

## 2020-07-04 MED ORDER — NITROFURANTOIN MONOHYD MACRO 100 MG PO CAPS
100.0000 mg | ORAL_CAPSULE | Freq: Two times a day (BID) | ORAL | Status: DC
  Administered 2020-07-04 (×2): 100 mg via ORAL
  Filled 2020-07-04 (×3): qty 1

## 2020-07-04 MED ORDER — POTASSIUM CHLORIDE CRYS ER 20 MEQ PO TBCR
40.0000 meq | EXTENDED_RELEASE_TABLET | Freq: Once | ORAL | Status: AC
  Administered 2020-07-04: 40 meq via ORAL
  Filled 2020-07-04: qty 2

## 2020-07-04 MED ORDER — IOHEXOL 300 MG/ML  SOLN
100.0000 mL | Freq: Once | INTRAMUSCULAR | Status: AC | PRN
  Administered 2020-07-04: 100 mL via INTRAVENOUS

## 2020-07-04 MED ORDER — IOHEXOL 9 MG/ML PO SOLN
ORAL | Status: AC
  Administered 2020-07-04: 500 mL via ORAL
  Filled 2020-07-04: qty 500

## 2020-07-04 MED ORDER — IOHEXOL 9 MG/ML PO SOLN
500.0000 mL | ORAL | Status: AC
  Administered 2020-07-04: 500 mL via ORAL

## 2020-07-04 MED ORDER — LACTATED RINGERS IV BOLUS
1000.0000 mL | Freq: Once | INTRAVENOUS | Status: AC
  Administered 2020-07-04: 1000 mL via INTRAVENOUS

## 2020-07-04 MED ORDER — CALCIUM CARBONATE ANTACID 500 MG PO CHEW
400.0000 mg | CHEWABLE_TABLET | Freq: Once | ORAL | Status: AC
  Administered 2020-07-04: 400 mg via ORAL
  Filled 2020-07-04: qty 2

## 2020-07-04 NOTE — TOC Progression Note (Signed)
Transition of Care [facility]) - Progression Note    Patient Details  Name: Andres [name] MRN: [id number] Date of Birth: 1981/06/12  [facility]) CM/SW Contact  [name], RN Phone Number: 07/04/2020, 3:59 PM  Clinical Narrative:    Placed a called to [name] at his AFL and she was under the impression pt wouldn't be ready until tomorrow. She wasn't able to fully assess the pt yesterday since he had received some Ativan. She wanted to call pts guardian to get their opinion. CM asked that she have guardian call CM.  No return call as of 1600 and CM called again and left voicemail. MD updated. TOC following.   Expected Discharge Plan: Skilled Nursing Facility Barriers to Discharge: Continued Medical Work up  Expected Discharge Plan and Services Expected Discharge Plan: Skilled Nursing Facility   Discharge Planning Services: CM Consult Post Acute Care Choice: Skilled Nursing Facility,Home Health Living arrangements for the past 2 months: Single Family Home                                       Social Determinants of Health (SDOH) Interventions    Readmission Risk Interventions No flowsheet data found.

## 2020-07-04 NOTE — Progress Notes (Signed)
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 He presented to the ED following an unwitnessed fall in the shower and change in his baseline following in that he was slumped over, drooling, and not as responsive. At baseline, he can ambulate unassisted and bathe and dress himself. During the day M-F he is at [place]. CT negative for cervical fx or acute intracranial abnormality. Further PMH: seizures, GERD, and depression.    PT Comments    Pt less agitated and aggressive this date, but continues to decline activities regularly. Based on new info in chart, at baseline pt lives at [place] and is not up much except to be changed. It appears like pt may be at baseline functional status. He requires maxAx2 to come to sit or return to supine and modA to roll. Deferred standing bouts this date due to watery bowel movements. Tried to stretch pt's legs to descrease flexor tone, but pt resistive. Noted sacral wound thus applied sacral cushion bandage and notified RN. As pt is most likely at baseline functional status, pt is being discharged from acute PT services at this time. If his ALF cannot care for pt as needed then recommend d/c to SNF.  Follow Up Recommendations  SNF;Supervision/Assistance - 24 hour (if not at baseline for ALF)     Equipment Recommendations  Other (comment) (unaware of available equipment at home)    Recommendations for Other Services       Precautions / Restrictions Precautions Precautions: Fall Precaution Comments: High Fall Risk; seizures Restrictions Weight Bearing Restrictions: No    Mobility  Bed Mobility Overal bed mobility: Needs Assistance Bed Mobility: Rolling;Sidelying  to Sit;Sit to Sidelying Rolling: Mod assist Sidelying to sit: Max assist;+2 for physical assistance;+2 for safety/equipment     Sit to sidelying: Max assist;+2 for physical assistance;+2 for safety/equipment General bed mobility comments: Mod A for rolling to L. Patient rolls with BLE flexed at knees and hips in fetal position. Max A +2 for sidelying to sit and to supine at trunk and BLE. Pt did reach for therapist's hand to assist with pulling trunk to sit up.  Transfers                 General transfer comment: Deferred this date due to watery bowel movement  Ambulation/Gait             General Gait Details: Deferred this date due to watery bowel movement   Stairs             Wheelchair Mobility    Modified Rankin (Stroke Patients Only) Modified Rankin (Stroke Patients Only) Pre-Morbid Rankin Score: Moderately severe disability Modified Rankin: Severe disability     Balance Overall balance assessment: Needs assistance Sitting-balance support: No upper extremity supported;Feet supported Sitting balance-Leahy Scale: Fair Sitting balance - Comments: No UE support and min guard assist to maintain static sitting EOB for several minutes.       Standing balance comment: Deferred due to watery bowel movement.                            Cognition Arousal/Alertness: Awake/alert Behavior During Therapy: Flat affect Overall Cognitive Status: No family/caregiver present to determine  baseline cognitive functioning                                 General Comments: Pt able to follow ~10% of commands with extra time, assumed baseline. Pt resistant to tasks often.      Exercises Other Exercises Other Exercises: Passive stretch to L knee into extensio0n 2x ~15 sec, with pt then resisting further Other Exercises: Attempted passive stretch to bilat ankles into dorsiflexion with toe extension but pt resisted Other Exercises: Passive stretch  to bilat hips into abduction 2x ~10 sec    General Comments General comments (skin integrity, edema, etc.): Multiple mepilex bandages to posterior trunk; noted sacral wound thus applied sacral cushion bandage and notified RN      Pertinent Vitals/Pain Pain Assessment: Faces Faces Pain Scale: Hurts even more Pain Location: lges with passive stretching Pain Descriptors / Indicators: Discomfort;Grimacing;Guarding Pain Intervention(s): Limited activity within patient's tolerance;Monitored during session;Repositioned    Home Living                      Prior Function            PT Goals (current goals can now be found in the care plan section) Acute Rehab PT Goals Patient Stated Goal: asking for soda and popcorn PT Goal Formulation: Patient unable to participate in goal setting Time For Goal Achievement: 07/17/20 Potential to Achieve Goals: Fair Progress towards PT goals: Goals met/education completed, patient discharged from PT (goals not met, but likely at baseline)    Frequency    Min 2X/week      PT Plan Discharge plan needs to be updated;Frequency needs to be updated    Co-evaluation              AM-PAC PT "6 Clicks" Mobility   Outcome Measure  Help needed turning from your back to your side while in a flat bed without using bedrails?: A Lot Help needed moving from lying on your back to sitting on the side of a flat bed without using bedrails?: A Lot Help needed moving to and from a bed to a chair (including a wheelchair)?: A Lot Help needed standing up from a chair using your arms (e.g., wheelchair or bedside chair)?: A Lot Help needed to walk in hospital room?: Total Help needed climbing 3-5 steps with a railing? : Total 6 Click Score: 10    End of Session   Activity Tolerance: Other (comment) (limited by willingness to participate) Patient left: in bed;with nursing/sitter in room (sitter requested bed alarm off) Nurse Communication: Mobility  status;Other (comment) (sacral wound) PT Visit Diagnosis: Unsteadiness on feet (R26.81);Other abnormalities of gait and mobility (R26.89);History of falling (Z91.81);Muscle weakness (generalized) (M62.81);Difficulty in walking, not elsewhere classified (R26.2)     Time: 1610-9604 PT Time Calculation (min) (ACUTE ONLY): 19 min  Charges:  $Therapeutic Activity: 8-22 mins                     Moishe Spice, PT, DPT Acute Rehabilitation Services  Pager: [phone] Office: [place] 07/04/2020, 4:30 PM

## 2020-07-04 NOTE — Progress Notes (Addendum)
Subjective:   Per night float, patient had 4 bowel movements since 7:00pm that were occasionally loose and watery. He apparently had 2 spoons of ice cream earlier and records indicate he is lactose intolerant. GI panel was ordered without C Diff testing and Lactaid was started as needed with meals.   Patient states "eat" and "water" and says yes when asked if hungry/thirsty. Says "no" when asked if in pain. He had an episode where he did spit up clear liquid with hiccups although is unable to provide further history.   Unable to obtain further history due to chronic cognitive impairment / nonverbal status.  Objective:  Vital signs in last 24 hours: Vitals:   07/03/20 2018 07/03/20 2306 07/04/20 0340 07/04/20 0752  BP: 106/63 111/83 115/84 109/77  Pulse: 92 92 100 (!) 108  Resp: 18 18  18   Temp: 99.1 F (37.3 C) 97.7 F (36.5 C) 98.1 F (36.7 C) 98.4 F (36.9 C)  TempSrc: Axillary Axillary Axillary Oral  SpO2: 100% 97% 100% 99%   General: Patient is thin and lying in bed in seemingly uncomfortable position. HENT: No active epistaxis. Slightly dry mucus membranes.  Respiratory: Unable to appreciate good lung sounds due to poor respiratory effort. Breathing comfortably on room air.  Cardiovascular: S4 gallop is present. No audible murmurs. No lower extremity edema.  Neurological: Patient is awake and alert.  Musculoskeletal: Patient does not move extremities, which are in contractures with increased muscle tone and severe cachexia.  Abdominal: Firm although no obvious tenderness to palpation. Bowel sounds intact. No rebound or guarding.  Psych: Minimal agitation.   Assessment/Plan:  Active Problems:   Fall   Pressure injury of skin   Altered mental status   Transaminitis  # Reported Change in Baseline s/p Unwitnessed Fall Likely 2/2 dehydration in setting of diarrhea and vomiting  Patient's caretakers x 2 years report decrease in ADL's at home s/p unwitnessed fall while  patient was showering. Patient states "eat" and "water" and says he is thirsty. His altered mental status and fall most concerning for dehydration in the setting of recent diarrhea and vomiting. He is at his baseline in terms of language (reportedly) although usually is able to follow commands and get up to be dressed, and does not move during examination with contractures. 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 Neurology considering mild concussion, although recommend no anticonvulsants. Borderline tachycardic this morning. Also consider symptoms could be due to UTI given small leukocytes with E. Coli on culture.  - Will give another L LR and encourage fluid intake  - Urine sensitivities pending  - Blood cultures with no growth  - GI panel pending  - Will check TTG/IGA for possible Celiac's  - Hold off on C. Diff testing given no recent documented ABX use  - Neurology consulted and signed off  - Continue 1:1 observation  - PT/OT recommending SNF  - Continue home Abilify, Trazodonet  - Caretaker will reaching out to legal guardian; will be in touch with her   # Hypokalemia Potassium 2.9 > 3.3. - Given [name] KCl - Continue to replace as needed  # Elevated LFT's AST 116, ALT 72 without known history of transaminitis or liver disease. May be medication induced due to Pepcid or Trazodone, although low dose. More likely acute with nausea, diarrhea. RUQ showed small gallbladder polyp but no acute findings. Hepatitis workup negative.  - Continue to trend CMP   # Pre-Diabetes Blood sugars  elevated in hospital. Hb A1c is 5.9.  - Continue to monitor blood sugars - Outpatient management   Prior to Admission Living Arrangement: Home Anticipated Discharge Location: Home  Barriers to Discharge: EEG Dispo: Anticipated discharge tomorrow if EEG unremarkable.   Glenford Bayley, MD 07/04/2020, 2:02 PM Pager: [phone] After 5pm  on weekdays and 1pm on weekends: On Call pager [phone]

## 2020-07-04 NOTE — Progress Notes (Signed)
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 I would not start antiepileptics at this time. It is possible he had a mild concussion with his fall though not definite. No further workup at this time, though working with therapy might be beneficial. Please call with any further questions.   Ritta Slot, MD [facility] [phone]  If 7pm- 7am, please page neurology on call as listed in AMION.

## 2020-07-04 NOTE — Hospital Course (Addendum)
-   May eventually require non-emergent multiphase CTA outpatient for further evaluation of 2.3cm enhancing nodule in the midline upper abdomen to rule out aneurysm vs. splenule   # Reported Change in Baseline s/p Unwitnessed Fall # Mild Rhabdomyolysis  Patient's caretakers x 2 years report decrease in ADL's at home s/p unwitnessed fall while patient was showering. Patient continues to be able to answer yes/no, which is apparently his baseline in language, although does not move on exam with contractures, and usually is able to ambulate unassisted, dress self and follow commands. 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 May also be due to dehydration in the setting of watery bowel movements and multiple episodes of emesis as patient appears dry, requesting fluids. S/p 2L IVF in ED.  - Will give another L [facility] 2  - Neurology consulted; MRI unremarkable.  - Will check EEG to r/o seizure  - Continue home Abilify, Trazodone - Will get 1:1 observation  - PT/OT recommend SNF placement  - Blood cultures and urine cultures pending   # Hypokalemia Potassium down to 2.9.  - Given KCl - Continue to replace   # New Transaminitis AST 116, ALT 72 without known history of transaminitis or liver disease. May be medication induced due to Pepcid or Trazodone, although low dose. More likely acute with nausea, diarrhea. Consider Hep A. RUQ showed small gallbladder polyp but no acute findings. Hep B, HCV, HIV testing negative.  - Will check Hep A IgM  - Continue to trend daily CMP  # Pre-Diabetes Blood sugars elevated in hospital. Hb A1c is 5.9. - Continue to monitor blood sugars  # Gross Hematuria Urinalysis shows > 50 RBC's, large hemoglobin, protein 30, small leukocytes and few bacteria. Bladder scan does not show retention. Most likely traumatic after attempted catheterization. - Continue to  monitor for improvement   *** ?NEEDS CTA outpatient to follow up lesion!!

## 2020-07-04 NOTE — Procedures (Signed)
Patient Name: Ryoma Nofziger  MRN: [id number]  Epilepsy Attending: Charlsie Quest  Referring Physician/Provider: Harvie Heck, PA Date: 07/03/2020 Duration: 25.15 mins  Patient history: 39yo M with ams. EEG to evaluate for seizure  Level of alertness: Awake, asleep  AEDs during EEG study: None  Technical aspects: This EEG study was done with scalp electrodes positioned according to the 10-20 International system of electrode placement. Electrical activity was acquired at a sampling rate of 500Hz  and reviewed with a high frequency filter of 70Hz  and a low frequency filter of 1Hz . EEG data were recorded continuously and digitally stored.   Description: No posterior dominant rhythm was noted. Sleep was characterized by vertex waves, sleep spindles (12 to 14 Hz), maximal frontocentral region.  EEG showed continuous generalized low amplitude 3 to 6 Hz theta-delta slowing.  Hyperventilation and photic stimulation were not performed.     ABNORMALITY -Continuous slow, generalized  IMPRESSION: This study is suggestive of moderate diffuse encephalopathy, nonspecific etiology. No seizures or epileptiform discharges were seen throughout the recording.  Toluwani Ruder 

## 2020-07-05 ENCOUNTER — Inpatient Hospital Stay (HOSPITAL_COMMUNITY): Payer: Medicare Other

## 2020-07-05 ENCOUNTER — Encounter (HOSPITAL_COMMUNITY): Payer: Self-pay | Admitting: Internal Medicine

## 2020-07-05 DIAGNOSIS — K56609 Unspecified intestinal obstruction, unspecified as to partial versus complete obstruction: Secondary | ICD-10-CM

## 2020-07-05 DIAGNOSIS — R7401 Elevation of levels of liver transaminase levels: Secondary | ICD-10-CM

## 2020-07-05 DIAGNOSIS — K5981 Ogilvie syndrome: Secondary | ICD-10-CM | POA: Diagnosis present

## 2020-07-05 DIAGNOSIS — R197 Diarrhea, unspecified: Secondary | ICD-10-CM

## 2020-07-05 DIAGNOSIS — K5989 Other specified functional intestinal disorders: Secondary | ICD-10-CM

## 2020-07-05 DIAGNOSIS — E43 Unspecified severe protein-calorie malnutrition: Secondary | ICD-10-CM | POA: Diagnosis present

## 2020-07-05 LAB — CBC WITH DIFFERENTIAL/PLATELET
Abs Immature Granulocytes: 0.02 10*3/uL (ref 0.00–0.07)
Basophils Absolute: 0 10*3/uL (ref 0.0–0.1)
Basophils Relative: 0 %
Eosinophils Absolute: 0 10*3/uL (ref 0.0–0.5)
Eosinophils Relative: 0 %
HCT: 30.3 % — ABNORMAL LOW (ref 39.0–52.0)
Hemoglobin: 10.1 g/dL — ABNORMAL LOW (ref 13.0–17.0)
Immature Granulocytes: 0 %
Lymphocytes Relative: 9 %
Lymphs Abs: 0.6 10*3/uL — ABNORMAL LOW (ref 0.7–4.0)
MCH: 32.4 pg (ref 26.0–34.0)
MCHC: 33.3 g/dL (ref 30.0–36.0)
MCV: 97.1 fL (ref 80.0–100.0)
Monocytes Absolute: 1.3 10*3/uL — ABNORMAL HIGH (ref 0.1–1.0)
Monocytes Relative: 19 %
Neutro Abs: 4.6 10*3/uL (ref 1.7–7.7)
Neutrophils Relative %: 72 %
Platelets: 226 10*3/uL (ref 150–400)
RBC: 3.12 MIL/uL — ABNORMAL LOW (ref 4.22–5.81)
RDW: 13.6 % (ref 11.5–15.5)
WBC: 6.5 10*3/uL (ref 4.0–10.5)
nRBC: 0 % (ref 0.0–0.2)

## 2020-07-05 LAB — COMPREHENSIVE METABOLIC PANEL
ALT: 74 U/L — ABNORMAL HIGH (ref 0–44)
AST: 92 U/L — ABNORMAL HIGH (ref 15–41)
Albumin: 2.2 g/dL — ABNORMAL LOW (ref 3.5–5.0)
Alkaline Phosphatase: 69 U/L (ref 38–126)
Anion gap: 10 (ref 5–15)
BUN: 25 mg/dL — ABNORMAL HIGH (ref 6–20)
CO2: 26 mmol/L (ref 22–32)
Calcium: 8.4 mg/dL — ABNORMAL LOW (ref 8.9–10.3)
Chloride: 106 mmol/L (ref 98–111)
Creatinine, Ser: 0.73 mg/dL (ref 0.61–1.24)
GFR, Estimated: >60 mL/min (ref >60)
Glucose, Bld: 137 mg/dL — ABNORMAL HIGH (ref 70–99)
Potassium: 3.5 mmol/L (ref 3.5–5.1)
Sodium: 142 mmol/L (ref 135–145)
Total Bilirubin: 0.5 mg/dL (ref 0.3–1.2)
Total Protein: 5.1 g/dL — ABNORMAL LOW (ref 6.5–8.1)

## 2020-07-05 LAB — URINE CULTURE: Culture: 20000 — AB

## 2020-07-05 LAB — PHOSPHORUS
Phosphorus: 1.1 mg/dL — ABNORMAL LOW (ref 2.5–4.6)
Phosphorus: 2.9 mg/dL (ref 2.5–4.6)

## 2020-07-05 LAB — MAGNESIUM: Magnesium: 1.9 mg/dL (ref 1.7–2.4)

## 2020-07-05 MED ORDER — DIATRIZOATE MEGLUMINE & SODIUM 66-10 % PO SOLN
90.0000 mL | Freq: Once | ORAL | Status: AC
  Administered 2020-07-05: 90 mL via NASOGASTRIC
  Filled 2020-07-05: qty 90

## 2020-07-05 MED ORDER — TRAZODONE HCL 50 MG PO TABS
100.0000 mg | ORAL_TABLET | Freq: Every evening | ORAL | Status: DC
  Administered 2020-07-05: 100 mg
  Filled 2020-07-05: qty 2

## 2020-07-05 MED ORDER — POTASSIUM PHOSPHATES 15 MMOLE/5ML IV SOLN
40.0000 mmol | Freq: Once | INTRAVENOUS | Status: AC
  Administered 2020-07-05: 40 mmol via INTRAVENOUS
  Filled 2020-07-05: qty 13.33

## 2020-07-05 MED ORDER — ARIPIPRAZOLE 10 MG PO TABS
10.0000 mg | ORAL_TABLET | Freq: Every day | ORAL | Status: DC
  Administered 2020-07-05: 10 mg

## 2020-07-05 MED ORDER — LACTATED RINGERS IV SOLN: INTRAVENOUS | Status: AC

## 2020-07-05 NOTE — Progress Notes (Signed)
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 He presented to the ED following an unwitnessed fall in the shower and change in his baseline following in that he was slumped over, drooling, and not as responsive. At baseline, he can ambulate unassisted and bathe and dress himself. During the day M-F [facility] is [facility] CT negative for cervical fx or acute intracranial abnormality. Further PMH: seizures, GERD, and depression.   OT comments  Per updated med chart, patient was LTC resident at an ALF and was mostly bedbound at baseline. Patient was not ambulatory and received total care for ADLs grossly. OT treatment session with focus on skin assessment and assessment of splinting needs prior to d/c from OT services and ultimate d/c to next level of care. Bilateral mittens and retrains removed for assessment of skin integrity of palms. Bilateral palms dry and free of redness and absent of smell demonstrating low risk of skin breakdown in this area. Patient currently functioning at baseline level and does not require continued acute occupational therapy services with OT to sign off at this time. Recommendation for return to ALF vs. SNF pending ALF ability to provide current level of assistance.    Follow Up Recommendations  SNF;Other (comment) (vs. ALF)    Equipment Recommendations  Other (comment) (Defer to next level of care)    Recommendations for Other Services      Precautions / Restrictions Precautions Precautions: Fall Precaution Comments: High Fall Risk; seizures Restrictions Weight Bearing Restrictions: No       Mobility Bed Mobility Overal bed mobility: Needs Assistance Bed Mobility:  Rolling Rolling: Mod assist            Transfers                      Balance                                           ADL either performed or assessed with clinical judgement   ADL                                         General ADL Comments: Grossly Max A to Total A for ADLs. Presumed baseline level of function.     Vision       Perception     Praxis      Cognition Arousal/Alertness: Awake/alert Behavior During Therapy: Agitated Overall Cognitive Status: No family/caregiver present to determine baseline cognitive functioning                                 General Comments: Patient presumed to be functioning at baseline.        Exercises Exercises: Other exercises Other Exercises Other Exercises: BUE PROM. Patient resistant to movement.   Shoulder Instructions       General Comments Decreased skin integrity with multiple wounds on posterior trunk and sacrum.    Pertinent Vitals/ Pain  Pain Assessment: Faces Faces Pain Scale: Hurts a little bit Pain Location: General discomfort with movement. Pain Descriptors / Indicators: Discomfort;Grimacing;Guarding Pain Intervention(s): Limited activity within patient's tolerance;Monitored during session  Home Living                                          Prior Functioning/Environment              Frequency           Progress Toward Goals  OT Goals(current goals can now be found in the care plan section)  Progress towards OT goals: Not progressing toward goals - comment (Patient at baseline level of function.)  Acute Rehab OT Goals Patient Stated Goal: Unable to state.  Plan Other (comment) (Patient at baseline level of function. OT to sign off.)    Co-evaluation                 AM-PAC OT "6 Clicks" Daily Activity     Outcome Measure   Help from another person eating meals?: A Lot Help from another  person taking care of personal grooming?: Total Help from another person toileting, which includes using toliet, bedpan, or urinal?: Total Help from another person bathing (including washing, rinsing, drying)?: Total Help from another person to put on and taking off regular upper body clothing?: Total Help from another person to put on and taking off regular lower body clothing?: Total 6 Click Score: 7    End of Session        Activity Tolerance Patient tolerated treatment well   Patient Left in bed;with nursing/sitter in room   Nurse Communication          Time: 1287-8676 OT Time Calculation (min): 8 min  Charges: OT General Charges $OT Visit: 1 Visit OT Treatments $Therapeutic Activity: 8-22 mins  Sharyon Peitz H. OTR/L Supplemental OT, Department of rehab services [phone]   Dora Simeone R H. 07/05/2020, 3:00 PM

## 2020-07-05 NOTE — TOC Progression Note (Signed)
Transition of Care [facility]) - Progression Note    Patient Details  Name: Andres [name] MRN: [id number] Date of Birth: Dec 17, 1980  [facility]) CM/SW Contact  [name], RN Phone Number: 07/05/2020, 10:42 AM  Clinical Narrative:    CM has attempted to reach [facility] about d/c. Voicemail left. Awaiting return call.   Expected Discharge Plan: Skilled Nursing Facility Barriers to Discharge: Continued Medical Work up  Expected Discharge Plan and Services Expected Discharge Plan: Skilled Nursing Facility   Discharge Planning Services: CM Consult Post Acute Care Choice: Skilled Nursing Facility,Home Health Living arrangements for the past 2 months: Single Family Home                                       Social Determinants of Health (SDOH) Interventions    Readmission Risk Interventions No flowsheet data found.

## 2020-07-05 NOTE — Progress Notes (Signed)
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 He was admitted for further work-up and management.  Pt admitted s/p fall.   12/10- CT of abdomen and pelvis revealed SBO vs ileus, NGT placed  Reviewed I/O's: -270 ml x 24 hours and +1.2 L since admission  UOP: 450 ml x 24 hours  NGT output: 300 ml x 24 hours  Case discussed with sitter, who reports NGT was placed overnight. Pt is currently in restraints to prevent pt from pulling out condom cath and NGT.   Pt unable to provide accurate history due to cognitive impairment. Pt often responded to RD with one word answers, such as "No!", "Andres [name]", and "pee pee". Reoriented pt and reminded him that he has a condom cath. When RD concluded visit, pt replied "Thank you! Bye!".   Pt previously on a regular diet. Noted meal completion was 10-100%.   Unable to obtain accurate weight at this time. Unsure if pt has lost weight.   Pt currently NPO; if prolonged NPO status is anticipated, consider initiation of nutrition support.   PTA pt resided in ALF.    Medications reviewed and include lactated ringer infusion @ 100 ml/hr.   Labs reviewed: Phos: 1.1 (on IV supplementation). Mg and K WDL.   NUTRITION - FOCUSED PHYSICAL EXAM:  Flowsheet Row Most Recent Value  Orbital Region Severe depletion  Upper Arm Region Severe depletion  Thoracic and Lumbar Region Severe depletion  Buccal Region Severe depletion  Temple Region Severe depletion  Clavicle Bone Region Severe depletion  Clavicle and Acromion Bone Region Severe depletion  Scapular Bone Region Severe depletion  Dorsal Hand Unable to assess  Patellar Region Severe depletion  Anterior Thigh Region Severe depletion  Posterior Calf Region Severe depletion  Edema (RD Assessment) None  Hair Reviewed  Eyes Reviewed  Mouth Reviewed  Skin Reviewed  Nails Reviewed       Diet Order:   Diet Order            Diet NPO time specified  Diet effective now                 EDUCATION NEEDS:   Not appropriate for education at this time  Skin:  Skin Assessment: Skin Integrity Issues: Skin Integrity Issues:: Stage II,Other (Comment) Stage II: sacrum, rt proximal hip, upper back Other: friction wound to mid upper back  Last BM:  07/04/20  Height:   Ht Readings from Last 1 Encounters:  06/16/20 5\' 6"  (1.676 m)    Weight:   Wt Readings from Last 1 Encounters:  06/16/20 46.8 kg    Ideal Body Weight:  61.8 kg  BMI:  There is no height or  weight on file to calculate BMI.  Estimated Nutritional Needs:   Kcal:  1850-2050  Protein:  90-105 grams  Fluid:  > 1.8 L    Levada Schilling, RD, LDN, CDCES Registered Dietitian II Certified Diabetes Care and Education Specialist Please refer to [facility] for RD and/or RD on-call/weekend/after hours pager

## 2020-07-05 NOTE — Progress Notes (Signed)
Subjective:   Patient had 2 episodes of emesis overnight. Second episode was brown tinged. Abdominal/Pelvis CT scan showed findings concerning for possible SBO and NG tube was placed with instructions for small bowel follow through. Patient is more conversational today, repeating "eat", "oatmeal" and "donut" saying yes he is hungry and thirsty. He says no when asked if in pain and if nauseas.  Unable to obtain further history due to chronic cognitive impairment / nonverbal status.  Objective:  Vital signs in last 24 hours: Vitals:   07/04/20 2000 07/05/20 0020 07/05/20 0354 07/05/20 0811  BP: 117/85 110/80 111/78 110/74  Pulse: 100 98 92 94  Resp: 18 18 18 18   Temp: 98.2 F (36.8 C) 98.4 F (36.9 C) 98.3 F (36.8 C) 98.6 F (37 C)  TempSrc: Oral Oral Axillary Oral  SpO2: 98% 99% 96% 100%   General: Patient is thin. Resting comfortably in no acute distress. Respiratory: Unable to appreciate good lung sounds due to poor respiratory effort. Breathing comfortably on room air.  Cardiovascular: Regular rate and rhythm. No murmurs, rubs or gallops.  Musculoskeletal: Patient does not move extremities, which are in contractures with increased muscle tone and severe cachexia. RUE swelling improved.  Neurological: Patient is alert. Answers yes and no.  Abdominal: Firm although no obvious tenderness to palpation. Bowel sounds intact. No rebound or guarding. NGT in place.  Psych: No agitation.   Assessment/Plan:  Active Problems:   Fall   Pressure injury of skin   Altered mental status   Transaminitis   Diarrhea  # Reported Change in Baseline s/p Unwitnessed Fall Likely 2/2 dehydration in setting of diarrhea and vomiting  # Likely Ileus  # Elevated LFT's  Patient's caretakers x 2 years report decrease in ADL's at home s/p unwitnessed fall while patient was showering. Patient continues to endorse hunger and thirst and denies pain or nausea. Continues to have emesis, possibly with  blood. He is at his baseline in terms of language (reportedly). CT abdomen/pelvis showed SBO vs. Ileus. Small bowel follow through showed mildly dilated air filled stomach and small bowel, with small bowel distention. Oral contrast reached the rectum, making ileus most likely. Patient previously followed with Dr. 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 Caretaker noted rectal bleeding in the past. LFT's also remain persistently elevated without hepatobiliary abnormalities on CT scan or RUQ ultrasound. - Continue to monitor CMP's - GI panel negative  - Celiac Antibodies pending  - NGT placed by night team with intermittent suction  - Consulted GI who will evaluate patient, appreciate their recommendations - Started LR 157mL/hour  - Continue NPO for now  - Continue soft restraints until NG tube removed  - 1:1 monitoring   # Worsening Normocytic Anemia Hemoglobin dropped from 11.7 to 10.1 today, with isolated, improving elevation in BUN. Patient initially had epistaxis and has history of rectal bleeding per caretaker with hemorrhoids and now has likely ileus.  - Likely due to acute, slow GI bleeding  - Continue to trend CBC's  # Hypophosphatemia # Borderline Hypokalemia Phosphorus is 1.1, K+ 3.5. Likely in setting of GI losses.  - 80m KCl given - Continue to replace as needed  # Pre-Diabetes Blood sugars elevated in hospital. Hb A1c is 5.9.  - Continue to monitor blood sugars - Outpatient management   Prior to Admission Living  Arrangement: Home Anticipated Discharge Location: Home  Barriers to Discharge: Further GI Evaluation  Code Status: Full Code  Glenford Bayley, MD 07/05/2020, 3:19 PM Pager: [phone] After 5pm on weekdays and 1pm on weekends: On Call pager  [phone]

## 2020-07-05 NOTE — Progress Notes (Incomplete)
Subjective:   Overnight, patient was found to have SBO. He had two episodes of emesis overnight, second episode was brownish in color. NGT was placed and soft wrist restraints were added so that he would not remove his NGT. ***  Unable to obtain further history due to chronic cognitive impairment / nonverbal status.  Objective:  Vital signs in last 24 hours: Vitals:   07/04/20 0752 07/04/20 2000 07/05/20 0020 07/05/20 0354  BP: 109/77 117/85 110/80 111/78  Pulse: (!) 108 100 98 92  Resp: 18 18 18 18   Temp: 98.4 F (36.9 C) 98.2 F (36.8 C) 98.4 F (36.9 C) 98.3 F (36.8 C)  TempSrc: Oral Oral Oral Axillary  SpO2: 99% 98% 99% 96%   General: Patient is thin and lying in bed in seemingly uncomfortable position. HENT: No active epistaxis. Slightly dry mucus membranes.  Respiratory: Unable to appreciate good lung sounds due to poor respiratory effort. Breathing comfortably on room air.  Cardiovascular: S4 gallop is present. No audible murmurs. No lower extremity edema.  Neurological: Patient is awake and alert.  Musculoskeletal: Patient does not move extremities, which are in contractures with increased muscle tone and severe cachexia.  Abdominal: Firm although no obvious tenderness to palpation. Bowel sounds intact. No rebound or guarding.  Psych: Minimal agitation.   Assessment/Plan:  Active Problems:   Fall   Pressure injury of skin   Altered mental status   Transaminitis   Diarrhea  # Small Bowel Obstruction *** Patient's caretakers x 2 years report decrease in ADL's at home s/p unwitnessed fall while patient was showering. Patient states "eat" and "water" and says he is thirsty. His altered mental status and fall most concerning for dehydration in the setting of recent diarrhea and vomiting. He is at his baseline in terms of language (reportedly) although usually is able to follow commands and get up to be dressed, and does not move during examination with contractures.  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 Neurology considering mild concussion, although recommend no anticonvulsants. Borderline tachycardic this morning. Also consider symptoms could be due to UTI given small leukocytes with E. Coli on culture.  - Will give another L LR and encourage fluid intake  - Urine sensitivities pending  - Blood cultures with no growth  - GI panel pending  - Will check TTG/IGA for possible Celiac's  - Hold off on C. Diff testing given no recent documented ABX use  - Neurology consulted and signed off  - Continue 1:1 observation  - PT/OT recommending SNF  - Continue home Abilify, Trazodonet  - Caretaker will reaching out to legal guardian; will be in touch with her   # Hypokalemia # Hypophosphatemia Phosphorous 1.1. Potassium 3.3 > 3.5. Likely in setting of GI losses.  - Gave IV K-Phos [name]  - Will recheck afternoon phosphate  - Continue to replace as needed  # Elevated LFT's AST 116, ALT 72 without known history of transaminitis or liver disease. May be medication induced due to Pepcid or Trazodone, although low dose. More likely acute with nausea, diarrhea. RUQ showed small gallbladder polyp but no acute findings. Hepatitis workup negative.  - Continue to trend CMP   # Pre-Diabetes Blood sugars elevated in hospital. Hb A1c is 5.9.  - Continue to monitor blood sugars - Outpatient management   Prior to Admission Living Arrangement: Home Anticipated Discharge Location: Home  Barriers to Discharge: EEG Dispo: Anticipated discharge tomorrow if EEG unremarkable.  Glenford Bayley, MD 07/05/2020, 5:57 AM Pager: [phone] After 5pm on weekdays and 1pm on weekends: On Call pager [phone]

## 2020-07-05 NOTE — Plan of Care (Signed)
  Problem: Acute Rehab OT Goals (only OT should resolve) Goal: Pt. Will Perform Eating Outcome: Not Applicable Goal: Pt. Will Perform Grooming Outcome: Not Applicable Goal: OT Additional ADL Goal #1 Outcome: Not Applicable

## 2020-07-05 NOTE — Progress Notes (Signed)
NG tube placed at 0115. Placement verified at 0300. Gastrografin administered at 0330. NG clamped. Radiology updated.

## 2020-07-05 NOTE — TOC Progression Note (Signed)
Transition of Care [facility]) - Progression Note    Patient Details  Name: Vahe Pienta MRN: [id number] Date of Birth: [birth year]  Transition of [facility]) CM/SW Contact  [name], RN Phone Number: 07/05/2020, 11:40 AM  Clinical Narrative:    Pt with NG tube currently. He is not ready for d/c. CM spoke with [name] and she has provided my contact to pts guardian for a call.  TOC following.   Expected Discharge Plan: Skilled Nursing Facility Barriers to Discharge: Continued Medical Work up  Expected Discharge Plan and Services Expected Discharge Plan: Skilled Nursing Facility   Discharge Planning Services: CM Consult Post Acute Care Choice: Skilled Nursing Facility,Home Health Living arrangements for the past 2 months: Single Family Home                                       Social Determinants of Health (SDOH) Interventions    Readmission Risk Interventions No flowsheet data found.

## 2020-07-05 NOTE — Consult Note (Addendum)
[facility] Consult: 2:11 PM 07/05/2020  LOS: 3 days    Referring Provider: Dr. Antony Contras Int Med TS Primary Care Physician:  Leilani Able, MD Primary Gastroenterologist:  Dr Meridee Score.    Legal guardian is [name] [phone].  Wayne Both is patient's caregiver, [phone]    Reason for Consultation: Ileus, colonic inertia.  Vomiting, diarrhea   HPI: Andres [name] is a 39 y.o. male.   PMH severe autism, mental retardation, fetal alcohol syndrome..  Minimally verbal at baseline.  Under state guardianship.  Seizure disorder.  Microcephaly.  Previous laparotomy and abdominal surgery with end to end ileocolonic anastomosis.  Timing and indications for surgery on known, even to his guardian.    11/2019 colonoscopy.  For evaluation rectal bleeding/hematochezia.  Patient had functional end-to-end ileocolonic anastomosis with healthy appearing mucosa.  Normal examined neoterminal ileum.  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  Normal mucosa throughout the colon.  Nonbleeding, nonthrombosed external and internal hemorrhoids.  Dr. Judie Petit added FiberCon, Colace, MiraLAX 1-2 times daily to achieve daily bowel movement, Anusol suppositories.  At baseline patient is alert, follows commands and is able to ambulate independently, bathe himself but is mostly nonverbal.  Pt brought to ED 5/6 after he fell in the shower.  After that he was leaning to the left and drooling more than usual, less interactive and was not walking.  Skeletal and head imaging negative for trauma.  EEG negative for seizure activity, consistent with known static encephalopathy.  Neurology recommends no initiation of antiepileptics.  Staff observed watery, nonbloody stools and a few episodes of nonbloody emesis  overnight into the morning of 12/8.   ~ 7 non-bloody watery stools yesterday, none so far today.   Baseline bowel habits: daily, brown, non-bloody, formed.  Hospital observed vomiting is more a regurgitation per nursing staff.  A few episodes yesterday.  NGT placed overnight and 300 mL recorded so far.   Outpatient caretaker reports postprandial "vomiting" after eating large meals. Caregiver is aware and limits volume of meals to avoid this issue.  Patient does not complain or express symptoms of abdominal pain though it is unclear if he would be able to do so.  Currently saying/requesting Pepsi, hotdog and chicken.    Abdominal ultrasound: 5 mm polyp at gallbladder wall, no choledocholithiasis or sludge.  CBD 2 mm.  Liver normal.  Patent PV with normal directional flow on Doppler. CTAP w contrast.  Fecalized material versus ingested material in gastric lumen.  Small bowel distended with fluid and measuring up to 4 cm.:  Distended with gas and fluid but no large or small bowel wall thickening.  No pneumatosis.  Appendix not visualized.  Nonspecific, mild swirling in the small bowel mesentery.  0.9 cm pulmonary nodule at right lower lobe (rec serial CT follow-ups). Portable KUB this morning showing NG tube location terminating in stomach.  Gastric and small bowel distention.  Enteric contrast noted throughout colon and rectum.  No free air  Potassium 2.9 >> 3.5.  Phos 1.1 >> 2.9.  Mag 1.9 CK 2388 >>  2152.   Lactic acid normal. T bili 0.8 >> 0.5.  Alkaline phosphatase 83 >> 69.  AST/ALT 116/72 >> 92/74. AKI, improved.  Resides at [facility] [place].    Past Medical History:  Diagnosis Date  . Autism   . Depression   . Disorder of intellectual development   . GERD (gastroesophageal reflux disease)   . History of seizures   . Seizures (HCC)    no seizure activity seen in over a year    History reviewed. No pertinent surgical history.  Prior to Admission medications    Medication Sig Start Date End Date Taking? Authorizing Provider  ARIPiprazole (ABILIFY) 10 MG tablet Take 10 mg by mouth in the morning.    Yes [provider]  Cholecalciferol (VITAMIN D-3) 25 MCG (1000 UT) CAPS Take 1,000 Units by mouth daily.    Yes [provider]  famotidine (PEPCID) 20 MG tablet Take 20 mg by mouth 2 (two) times daily.   Yes [provider]  hydrOXYzine (ATARAX/VISTARIL) 25 MG tablet Take 25 mg by mouth daily as needed (for agitation).  06/10/20  Yes [provider]  traZODone (DESYREL) 100 MG tablet Take 100 mg by mouth at bedtime.    Yes [provider]    Scheduled Meds: . ARIPiprazole  10 mg Per Tube Daily  . traZODone  100 mg Per Tube QPM   Infusions: . lactated ringers 100 mL/hr at 07/05/20 0701   PRN Meds:    Allergies as of 07/01/2020 - Review Complete 07/01/2020  Allergen Reaction Noted  . Lactose Diarrhea 01/31/2015    Family History  Problem Relation Age of Onset  . Mental illness Mother     Social History   Socioeconomic History  . Marital status: Single    Spouse name: Not on file  . Number of children: Not on file  . Years of education: Not on file  . Highest education level: Not on file  Occupational History  . Not on file  Tobacco Use  . Smoking status: Never Smoker  . Smokeless tobacco: Never Used  Vaping Use  . Vaping Use: Never used  Substance and Sexual Activity  . Alcohol use: Not Currently  . Drug use: Not Currently  . Sexual activity: Not Currently  Other Topics Concern  . Not on file  Social History Narrative  . Not on file   Social Determinants of Health   Financial Resource Strain: Not on file  Food Insecurity: Not on file  Transportation Needs: Not on file  Physical Activity: Not on file  Stress: Not on file  Social Connections: Not on file  Intimate Partner Violence: Not on file    REVIEW OF SYSTEMS: Constitutional: Recently more lethargic. ENT:  No nose  bleeds Pulm: No cough, no observed dyspnea. CV:  No palpitations, no LE edema.  GU:  No hematuria, no frequency GI: See HPI  heme: No observation reports of excessive bleeding or bruising. Transfusions: No blood product transfusion per record. Neuro:  No headaches, no peripheral tingling or numbness Derm:  No itching, no rash or sores.  Endocrine:  No sweats or chills.  No polyuria or dysuria Immunization: Reviewed.  I do not see records yeah or may regarding Covid vaccination    PHYSICAL EXAM: Vital signs in last 24 hours: Vitals:   07/05/20 0354 07/05/20 0811  BP: 111/78 110/74  Pulse: 92 94  Resp: 18 18  Temp: 98.3 F (36.8 C) 98.6 F (37 C)  SpO2: 96% 100%   Wt Readings from Last 3 Encounters:  06/16/20 46.8 kg  12/12/19 58.1 kg  12/06/19 58.1 kg    General: Patient laying in bed.  Developmental disabilities obvious in looking at his face and body. Head: Facial features consistent with developmental impairment.  Eyes: No conjunctival pallor or scleral icterus Ears: No obvious hearing deficit as he responds to normal volume voice. Nose: No congestion or discharge Mouth: Somewhat swollen lips, unable to see much of his oral cavity but tongue appears slightly dry.  There is no visible blood. Neck: No JVD, no masses, no thyromegaly Lungs: Clear bilaterally.  No dyspnea.  No cough Heart: RRR.  No MRG.  S1, S2 present. Abdomen: Thin.  Long well-healed midline scar with slight keloid formation.  No HSM, masses, bruits, hernias.  No tinkling or tympanic bowel sounds.  Bowel sounds reduced in frequency..   Rectal: Deferred Musc/Skeltl: Patient laying in a somewhat contractured state especially his legs.  Staff tells me that he does extend these from time to time into a straight position.  Overall osteoporotic appearance. Extremities: No CCE. Neurologic: Verbalizations consists of a few words as well as grunting. Skin: No rash, no suspicious lesions.  Psych: Cooperative in a  limited extent, does not consistently follow simple commands.  Mostly calm.  Intake/Output from previous day: 12/09 0701 - 12/10 0700 In: 480 [P.O.:480] Out: 750 [Urine:450; Emesis/NG output:300] Intake/Output this shift: No intake/output data recorded.  LAB RESULTS: Recent Labs    07/03/20 0120 07/04/20 0623 07/05/20 0347  WBC 5.1 6.3 6.5  HGB 11.9* 11.7* 10.1*  HCT 34.1* 36.3* 30.3*  PLT 226 219 226   BMET Lab Results  Component Value Date   NA 142 07/05/2020   NA 144 07/04/2020   NA 145 07/03/2020   K 3.5 07/05/2020   K 3.3 (L) 07/04/2020   K 2.9 (L) 07/03/2020   CL 106 07/05/2020   CL 106 07/04/2020   CL 104 07/03/2020   CO2 26 07/05/2020   CO2 27 07/04/2020   CO2 29 07/03/2020   GLUCOSE 137 (H) 07/05/2020   GLUCOSE 128 (H) 07/04/2020   GLUCOSE 131 (H) 07/03/2020   BUN 25 (H) 07/05/2020   BUN 38 (H) 07/04/2020   BUN 51 (H) 07/03/2020   CREATININE 0.73 07/05/2020   CREATININE 0.78 07/04/2020   CREATININE 1.06 07/03/2020   CALCIUM 8.4 (L) 07/05/2020   CALCIUM 9.4 07/04/2020   CALCIUM 9.0 07/03/2020   LFT Recent Labs    07/03/20 0120 07/04/20 0623 07/05/20 0347  PROT 5.8* 5.9* 5.1*  ALBUMIN 2.6* 2.5* 2.2*  AST 116* 95* 92*  ALT 72* 77* 74*  ALKPHOS 81 80 69  BILITOT 0.7 0.8 0.5   PT/INR No results found for: INR, PROTIME Hepatitis Panel Recent Labs    07/03/20 1146  HEPAIGM NON REACTIVE   C-Diff No components found for: CDIFF Lipase  No results found for: LIPASE  Drugs of Abuse  No results found for: LABOPIA, COCAINSCRNUR, LABBENZ, AMPHETMU, THCU, LABBARB   RADIOLOGY STUDIES: EEG  Result Date: 07/04/2020 Charlsie Quest, MD     07/04/2020  8:36 AM Patient Name: Nollan Muldrow MRN: [id number] Epilepsy Attending: Charlsie Quest Referring Physician/Provider: Harvie Heck, PA Date: 07/03/2020 Duration: 25.15 mins Patient history: 39yo M with ams. EEG to evaluate for seizure Level of alertness: Awake, asleep AEDs during EEG study:  None Technical aspects: This EEG study was done with scalp electrodes positioned according to the 10-20 International system of  electrode placement. Electrical activity was acquired at a sampling rate of  and reviewed with a high frequency filter of  and a low frequency filter of . EEG data were recorded continuously and digitally stored. Description: No posterior dominant rhythm was noted. Sleep was characterized by vertex waves, sleep spindles (12 to 14 Hz), maximal frontocentral region.  EEG showed continuous generalized low amplitude 3 to 6 Hz theta-delta slowing.  Hyperventilation and photic stimulation were not performed.   ABNORMALITY -Continuous slow, generalized IMPRESSION: This study is suggestive of moderate diffuse encephalopathy, nonspecific etiology. No seizures or epileptiform discharges were seen throughout the recording. Charlsie Quest   MR BRAIN WO CONTRAST  Result Date: 07/03/2020 CLINICAL DATA:  Mental status changes of unknown cause. EXAM: MRI HEAD WITHOUT CONTRAST TECHNIQUE: Multiplanar, multiecho pulse sequences of the brain and surrounding structures were obtained without intravenous contrast. COMPARISON:  Head CT 07/01/2020 FINDINGS: Brain: The patient was scanned in the decubitus position. Diffusion imaging does not show any acute or subacute infarction or other cause of restricted diffusion. The brainstem and cerebellum are normal. Cerebral hemispheres are normal without evidence of old or acute infarction, mass lesion, hemorrhage, hydrocephalus or extra-axial collection. Vascular: Major vessels at the base of the brain show flow. Skull and upper cervical spine: Negative Sinuses/Orbits: Clear/normal Other: None IMPRESSION: Normal examination. No abnormality seen to explain the clinical presentation. Electronically Signed   By: Paulina Fusi M.D.   On: 07/03/2020 14:45   CT ABDOMEN PELVIS W CONTRAST  Addendum Date: 07/05/2020   ADDENDUM REPORT: 07/05/2020 00:09  ADDENDUM: These results were called by telephone at the time of interpretation on 07/05/2020 at 12:07 am to provider Dr. Barbaraann Faster, who verbally acknowledged these results. Electronically Signed   By: Tish Frederickson M.D.   On: 07/05/2020 00:09   Result Date: 07/05/2020 CLINICAL DATA:  diarrhea, unwitnessed fall in the shower and change in his baseline following in that he was slumped over, drooling, and not as responsive EXAM: CT ABDOMEN AND PELVIS WITH CONTRAST TECHNIQUE: Multidetector CT imaging of the abdomen and pelvis was performed using the standard protocol following bolus administration of intravenous contrast. CONTRAST:  OMNIPAQUE IOHEXOL 300 MG/ML  SOLN COMPARISON:  None. FINDINGS: Lower chest: Fluid noted within the esophageal lumen. Right lower lobe subsegmental atelectasis. A 0.9 cm nodular-like density within the right lower lobe. There is a 0.6 cm pulmonary nodule slightly more superiorly. Hepatobiliary: No focal liver abnormality. No gallstones, gallbladder wall thickening, or pericholecystic fluid. No biliary dilatation. Pancreas: No focal lesion. Normal pancreatic contour. No surrounding inflammatory changes. No main pancreatic ductal dilatation. Spleen: Normal in size without focal abnormality. Adrenals/Urinary Tract: Adrenal glands are not well visualized. Bilateral kidneys enhance symmetrically. Subcentimeter hypodensities are too small to characterize. There is a 2.9 cm fluid density lesion within the left kidney that likely represents a simple cyst. No hydronephrosis. No hydroureter. The urinary bladder is unremarkable. Stomach/Bowel: [employer] versus ingested material within the gastric lumen. The small bowel is distended with fluid measuring up to 4 cm. The large bowel is distended with gas and fluid. No large or small bowel wall thickening. No pneumatosis. The appendix is not visualized. Vascular/Lymphatic: No abdominal aorta or iliac aneurysm. Nonspecific mild swirling  within the small bowel mesentery (9:59-69) no abdominal, pelvic, or inguinal lymphadenopathy. Reproductive: Prostate is unremarkable. Other: No intraperitoneal free fluid. No intraperitoneal free gas. No organized fluid collection. Musculoskeletal: No abdominal wall hernia or abnormality No suspicious lytic or blastic osseous lesions. No acute displaced  fracture. IMPRESSION: 1. Findings concerning for a bowel obstruction versus ileus. No findings suggest colitis or bowel perforation. Fluid noted within the esophageal lumen. Consider serial abdominal X-rays after PO contrast ingestion as well as enteric tube placement. 2. Findings consistent with known diarrheal state. 3. Nodular like 0.9 cm right lower lobe pulmonary nodule. Non-contrast chest CT at 3-6 months is recommended. If the nodules are stable at time of repeat CT, then future CT at 18-24 months (from today's scan) is considered optional for low-risk patients, but is recommended for high-risk patients. This recommendation follows the consensus statement: Guidelines for Management of Incidental Pulmonary Nodules Detected on CT Images: From the Fleischner Society 2017; Radiology 2017; 284:228-243. 4. No findings to suggest acute traumatic injury. Electronically Signed: By: Tish FredericksonMorgane  Naveau M.D. On: 07/04/2020 23:55   DG CHEST PORT 1 VIEW  Result Date: 07/05/2020 CLINICAL DATA:  NG tube. EXAM: PORTABLE CHEST 1 VIEW COMPARISON:  Abdomen 07/05/2020 CT 07/04/2020. Chest x-ray 07/01/2020. FINDINGS: NG tube noted with tip over the stomach. Contrast noted within the NG tube. Gastric and bowel distention again noted. Minimal infiltrate left mid lung cannot be completely excluded. No pleural effusion or pneumothorax. Heart size stable. Thoracic spine scoliosis and degenerative change. IMPRESSION: 1. NG tube noted with tip over the stomach. Gastric and bowel distention again noted. 2. Minimal infiltrate left mid lung cannot be completely excluded. Electronically  Signed   By: Maisie Fushomas  Register   On: 07/05/2020 07:15   DG Abd Portable 1V-Small Bowel Obstruction Protocol-initial, 8 hr delay  Result Date: 07/05/2020 CLINICAL DATA:  Encounter for small bowel obstruction. 8 hour delay. EXAM: PORTABLE ABDOMEN - 1 VIEW COMPARISON:  Prior abdomen 07/05/2020.  CT 07/04/2020. FINDINGS: NG tube noted in the stomach. Gastric and small bowel distention noted. Oral contrast is noted throughout the colon and rectum. No free air identified. IMPRESSION: Gastric and small bowel distention noted. Oral contrast is noted throughout the colon and rectum. Electronically Signed   By: Maisie Fushomas  Register   On: 07/05/2020 11:51   DG Abd Portable 1V-Small Bowel Protocol-Position Verification  Result Date: 07/05/2020 CLINICAL DATA:  NG tube placement EXAM: PORTABLE ABDOMEN - 1 VIEW COMPARISON:  None. FINDINGS: Tip of the NG tube is seen within the distal stomach. Mildly dilated air-filled loops of bowel and gastric bubble is seen throughout. No radio-opaque calculi or other significant radiographic abnormality are seen. IMPRESSION: Tip the NG tube within the distal stomach. Electronically Signed   By: Jonna ClarkBindu  Avutu M.D.   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 Postprandial nausea/regurgitation.  Watery stools yesterday but these have subsided today. Outpatient meds include Pepcid bid, but not using MiraLAX, Colace, fiber supplements as he is having daily bowel movements at home. NG tube placed  *   Ileo-colonic anastomosis (healthy) evident at colonoscopy.  Unknown previous surgery.    *   Elevated transaminases.  Abdominal ultrasound showing gallbladder wall polyp but otherwise normal gallbladder, liver, biliary tree.  *    Elevated CK, etiology?Marland Kitchen.  Only mild and now mostly resolved AKI.  *    Hypokalemia, resolved.  Hypophosphatemia, resolved.  Normal magnesium   PLAN:      *    Monitor volume NG tube output.  If not significant, would clamp tube and if no resulting vomiting occurs, would remove this after 4 to 6 hours.  *   From GI  perspective it would be okay for him to get ice chips at the present time.  *   Role for oral metoclopramide?  Reluctant to add this as he already takes Abilify which increases his risk for tardive dyskinesia.  Metoclopramide would only add to this risk.   Jennye Moccasin  07/05/2020, 2:11 PM Phone [phone]    Attending Physician Note   I have taken a history, examined the patient and reviewed the chart. I agree with the Advanced Practitioner's note, impression and recommendations.  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

## 2020-07-05 NOTE — Progress Notes (Signed)
Spoke to Dr.Naveau who read CT Abdomen pelvis with contrast. She was concerned for small bowel obstruction vs. Ileus. There is some contents in gastric lumen, the small and large bowel are distended, and noted no wall thickening. See official read.  Called and discussed placing enteric tube with caretaker and she is okay with placing the enteric tube. I shared with caretaker I am concerned patient may not tolerate the procedure and may try to pull the tube out secondary to his cognitive impairment. For patients safety it is important we decompress stomach.  In addition I spoke to patient RN regarding plan.  Plan: - Small bowel protocol as below  Guidelines:      1. A NGT is inserted and placed to suction for at least two hours. Correct placement in the stomach is verified by an anteroposterior radiograph.       2. Provided the patient is not actively vomiting at this point, 90-ml Gastrografin is administered per NGT, and the tube is clamped for 1 hour. It is then replaced to suction.      3. Eight hours after administration of Gastrografin, a portable abdominal KUB x-ray is obtained. If the contrast has reached the cecum, the test is considered successful; the NGT is removed          and sips of liquid should be started.  Advance to full liquids within 24 hours. Discharge home if > 800 ml liquid within 24 hours.       4. Frequently, the osmotic effect of the Gastrografin results in the patient having large bowel movements a few hours after administration, which is another confirmation that the bowel           obstruction is resolving.      5. If the Gastrografin does not reach the cecum at 8 hours, the NGT is continued to suction, and another portable abdominal KUB x-ray is obtained at 24 hours after Gastrografin administration.          At this time, if contrast has still failed to reach the cecum, operative intervention is strongly considered.   Thurmon Fair, MD PGY2 Internal Medicine

## 2020-07-06 DIAGNOSIS — J869 Pyothorax without fistula: Secondary | ICD-10-CM | POA: Diagnosis not present

## 2020-07-06 DIAGNOSIS — R790 Abnormal level of blood mineral: Secondary | ICD-10-CM

## 2020-07-06 DIAGNOSIS — K219 Gastro-esophageal reflux disease without esophagitis: Secondary | ICD-10-CM

## 2020-07-06 DIAGNOSIS — R1110 Vomiting, unspecified: Secondary | ICD-10-CM

## 2020-07-06 DIAGNOSIS — G40909 Epilepsy, unspecified, not intractable, without status epilepticus: Secondary | ICD-10-CM

## 2020-07-06 DIAGNOSIS — W19XXXA Unspecified fall, initial encounter: Secondary | ICD-10-CM

## 2020-07-06 DIAGNOSIS — F840 Autistic disorder: Secondary | ICD-10-CM

## 2020-07-06 DIAGNOSIS — R7989 Other specified abnormal findings of blood chemistry: Secondary | ICD-10-CM

## 2020-07-06 LAB — CBC WITH DIFFERENTIAL/PLATELET
Abs Immature Granulocytes: 0.05 10*3/uL (ref 0.00–0.07)
Basophils Absolute: 0 10*3/uL (ref 0.0–0.1)
Basophils Relative: 0 %
Eosinophils Absolute: 0 10*3/uL (ref 0.0–0.5)
Eosinophils Relative: 0 %
HCT: 28.8 % — ABNORMAL LOW (ref 39.0–52.0)
Hemoglobin: 9.4 g/dL — ABNORMAL LOW (ref 13.0–17.0)
Immature Granulocytes: 1 %
Lymphocytes Relative: 12 %
Lymphs Abs: 0.7 10*3/uL (ref 0.7–4.0)
MCH: 31.4 pg (ref 26.0–34.0)
MCHC: 32.6 g/dL (ref 30.0–36.0)
MCV: 96.3 fL (ref 80.0–100.0)
Monocytes Absolute: 0.9 10*3/uL (ref 0.1–1.0)
Monocytes Relative: 16 %
Neutro Abs: 3.9 10*3/uL (ref 1.7–7.7)
Neutrophils Relative %: 71 %
Platelets: 205 10*3/uL (ref 150–400)
RBC: 2.99 MIL/uL — ABNORMAL LOW (ref 4.22–5.81)
RDW: 13.5 % (ref 11.5–15.5)
WBC: 5.5 10*3/uL (ref 4.0–10.5)
nRBC: 0 % (ref 0.0–0.2)

## 2020-07-06 LAB — COMPREHENSIVE METABOLIC PANEL
ALT: 69 U/L — ABNORMAL HIGH (ref 0–44)
AST: 74 U/L — ABNORMAL HIGH (ref 15–41)
Albumin: 2.1 g/dL — ABNORMAL LOW (ref 3.5–5.0)
Alkaline Phosphatase: 67 U/L (ref 38–126)
Anion gap: 7 (ref 5–15)
BUN: 22 mg/dL — ABNORMAL HIGH (ref 6–20)
CO2: 29 mmol/L (ref 22–32)
Calcium: 8.6 mg/dL — ABNORMAL LOW (ref 8.9–10.3)
Chloride: 109 mmol/L (ref 98–111)
Creatinine, Ser: 0.59 mg/dL — ABNORMAL LOW (ref 0.61–1.24)
GFR, Estimated: >60 mL/min (ref >60)
Glucose, Bld: 98 mg/dL (ref 70–99)
Potassium: 3.6 mmol/L (ref 3.5–5.1)
Sodium: 145 mmol/L (ref 135–145)
Total Bilirubin: 0.8 mg/dL (ref 0.3–1.2)
Total Protein: 5.1 g/dL — ABNORMAL LOW (ref 6.5–8.1)

## 2020-07-06 LAB — TISSUE TRANSGLUTAMINASE, IGA: Tissue Transglutaminase Ab, IgA: <2 U/mL (ref 0–3)

## 2020-07-06 MED ORDER — LACTATED RINGERS IV BOLUS
500.0000 mL | Freq: Once | INTRAVENOUS | Status: AC
  Administered 2020-07-07: 500 mL via INTRAVENOUS

## 2020-07-06 MED ORDER — TRAZODONE HCL 100 MG PO TABS
100.0000 mg | ORAL_TABLET | Freq: Every evening | ORAL | Status: DC
  Administered 2020-07-06 – 2020-08-16 (×42): 100 mg via ORAL
  Filled 2020-07-06 (×16): qty 1
  Filled 2020-07-06: qty 2
  Filled 2020-07-06 (×26): qty 1

## 2020-07-06 MED ORDER — POTASSIUM CHLORIDE 10 MEQ/100ML IV SOLN
10.0000 meq | INTRAVENOUS | Status: AC
  Administered 2020-07-06 (×3): 10 meq via INTRAVENOUS
  Filled 2020-07-06 (×3): qty 100

## 2020-07-06 MED ORDER — POTASSIUM CHLORIDE 10 MEQ/100ML IV SOLN
10.0000 meq | Freq: Once | INTRAVENOUS | Status: AC
  Administered 2020-07-06: 15:00:00 10 meq via INTRAVENOUS
  Filled 2020-07-06: qty 100

## 2020-07-06 MED ORDER — LACTATED RINGERS IV BOLUS
1000.0000 mL | Freq: Once | INTRAVENOUS | Status: AC
  Administered 2020-07-06: 13:00:00 1000 mL via INTRAVENOUS

## 2020-07-06 MED ORDER — FAMOTIDINE 20 MG PO TABS
20.0000 mg | ORAL_TABLET | Freq: Two times a day (BID) | ORAL | Status: DC
  Administered 2020-07-06 (×2): 20 mg via ORAL
  Filled 2020-07-06 (×2): qty 1

## 2020-07-06 MED ORDER — ARIPIPRAZOLE 10 MG PO TABS
10.0000 mg | ORAL_TABLET | Freq: Every day | ORAL | Status: DC
  Administered 2020-07-06 – 2020-08-17 (×42): 10 mg via ORAL
  Filled 2020-07-06 (×42): qty 1

## 2020-07-06 NOTE — Progress Notes (Signed)
  Date: 07/06/2020  Patient name: Andres [name]  Medical record number: [id number]  Date of birth: Aug 06, 1980        I have seen and evaluated this patient and I have discussed the plan of care with the house staff. Please see their note for complete details. I concur with Dr Renaee Munda findings.  Gust Rung, DO 07/06/2020, 11:58 AM

## 2020-07-06 NOTE — Progress Notes (Signed)
   Subjective:   Patient seen this AM on bedside rounds. He is not answering questions regarding pain or discomfort but does state "pepsi" several times. Sitter at bedside reports no events overnight. NGT in place but disconnected from suction.   Objective:  Vital signs in last 24 hours: Vitals:   07/05/20 2304 07/06/20 0405 07/06/20 0406 07/06/20 0725  BP: 117/80 111/76  99/66  Pulse: 95 84  (!) 102  Resp: 18 18  18   Temp: 98 F (36.7 C) 98 F (36.7 C)  97.9 F (36.6 C)  TempSrc: Oral Oral  Oral  SpO2: 100% 91% 98% 100%   Physical Exam Vitals and nursing note reviewed.  Constitutional:      Appearance: He is cachectic.     Comments: Chronically ill appearing  HENT:     Head: Normocephalic and atraumatic.  Cardiovascular:     Rate and Rhythm: Normal rate and regular rhythm.  Pulmonary:     Effort: Pulmonary effort is normal. No respiratory distress.  Abdominal:     Palpations: Abdomen is soft.     Comments: Hypoactive but present bowel sounds   Musculoskeletal:     Right lower leg: No edema.     Left lower leg: No edema.  Skin:    General: Skin is warm and dry.  Neurological:     Mental Status: He is alert. Mental status is at baseline.    Assessment/Plan:  Active Problems:   Fall   Pressure injury of skin   Altered mental status   Transaminitis   Diarrhea   Small bowel obstruction (HCC)   Ogilvie's syndrome   Protein-calorie malnutrition, severe  # Unwitnessed Fall  Uncertain if mechanical versus LOC. MRI, EEG and electrolytes stable. No further work up at this time beside careful monitoring.   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 GI consulted given they are following him in the OP  setting. Recommendation for clamping of NGT and advancement of diet if stable. NGT clamped overnight without emesis events. Will advance diet today.   - GI following; appreciate their recs - Clear liquid diet  - Maintain K  > 4.0 - Anti-TTG still pending  # Acute Normocytic Anemia  Hemoglobin today of 9.4%. Stark 4 point drop from admission. No obvious source of bleeding at this time. It may be dilutional from IVF? Will check iron panel, B12, folate tomorrow   - CBC daily - Transfuse for hgb < 7   # Elevated LFTs LFTs improving. New problem however last CMP in 2019. Viral hepatitis panel negative. No evidence of liver failure.   - Continue to monitor  Prior to Admission Living Arrangement: Home Anticipated Discharge Location: SNF Barriers to Discharge: Placement and continued medical evaluation   Dr. 2020 Internal Medicine PGY-2  Pager: [phone] After 5pm on weekdays and 1pm on weekends: On Call pager [phone]  07/06/2020, 11:04 AM

## 2020-07-06 NOTE — Plan of Care (Signed)

## 2020-07-06 NOTE — Progress Notes (Signed)
Pt BP, HR, with conti MEWS yellow. [place] ([facility]) informed and bolus request.

## 2020-07-06 NOTE — Progress Notes (Signed)
Pt's wound care completed at 0430, pt tolerated well.

## 2020-07-06 NOTE — Progress Notes (Addendum)
Patient ID: Andres [name], male   DOB: Aug 13, 1980, 39 y.o.   MRN: [id number]    Progress Note   Subjective   Day # 4  CC: Ileus/ Olgilvies  NG pulled by patient a few minutes ago Last BM yesterday  Difficult to assess c/o pain, nausea  Potassium 3.6/creatinine 0.59 AST 74/ALT 69 stable WBC 5.5/hemoglobin 9.4     Objective   Vital signs in last 24 hours: Temp:  [97.9 F (36.6 C)-100.5 F (38.1 C)] 97.9 F (36.6 C) (12/11 0725) Pulse Rate:  [70-102] 102 (12/11 0725) Resp:  [18] 18 (12/11 0725) BP: (99-126)/(66-85) 99/66 (12/11 0725) SpO2:  [91 %-100 %] 100 % (12/11 0725) Last BM Date: 07/05/20 General:     AA male  in NAD Heart:  Regular rate and rhythm; no murmurs Lungs: Respirations even and unlabored, lungs CTA bilaterally Abdomen:  Soft,  nondistended. No focal tenderness, no guarding, BS decreased Extremities:  Without edema. Neurologic:  Alert  Psych:  Cooperative.   Intake/Output from previous day: 12/10 0701 - 12/11 0700 In: 1.3 [IV Piggyback:1.3] Out: 1000 [Urine:1000] Intake/Output this shift: No intake/output data recorded.  Lab Results: Recent Labs    07/04/20 0623 07/05/20 0347 07/06/20 0029  WBC 6.3 6.5 5.5  HGB 11.7* 10.1* 9.4*  HCT 36.3* 30.3* 28.8*  PLT 219 226 205   BMET Recent Labs    07/04/20 0623 07/05/20 0347 07/06/20 0029  NA 144 142 145  K 3.3* 3.5 3.6  CL 106 106 109  CO2 27 26 29   GLUCOSE 128* 137* 98  BUN 38* 25* 22*  CREATININE 0.78 0.73 0.59*  CALCIUM 9.4 8.4* 8.6*   LFT Recent Labs    07/06/20 0029  PROT 5.1*  ALBUMIN 2.1*  AST 74*  ALT 69*  ALKPHOS 67  BILITOT 0.8    Studies/Results: CT ABDOMEN PELVIS W CONTRAST  Addendum Date: 07/05/2020   ADDENDUM REPORT: 07/05/2020 00:09 ADDENDUM: These results were called by telephone at the time of interpretation on 07/05/2020 at 12:07 am to provider Dr. 14/04/2020, who verbally acknowledged these results. Electronically Signed   By: Barbaraann Faster M.D.   On:  07/05/2020 00:09   Result Date: 07/05/2020 CLINICAL DATA:  diarrhea, unwitnessed fall in the shower and change in his baseline following in that he was slumped over, drooling, and not as responsive EXAM: CT ABDOMEN AND PELVIS WITH CONTRAST TECHNIQUE: Multidetector CT imaging of the abdomen and pelvis was performed using the standard protocol following bolus administration of intravenous contrast. CONTRAST:  14/04/2020 OMNIPAQUE IOHEXOL 300 MG/ML  SOLN COMPARISON:  None. FINDINGS: Lower chest: Fluid noted within the esophageal lumen. Right lower lobe subsegmental atelectasis. A 0.9 cm nodular-like density within the right lower lobe. There is a 0.6 cm pulmonary nodule slightly more superiorly. Hepatobiliary: No focal liver abnormality. No gallstones, gallbladder wall thickening, or pericholecystic fluid. No biliary dilatation. Pancreas: No focal lesion. Normal pancreatic contour. No surrounding inflammatory changes. No main pancreatic ductal dilatation. Spleen: Normal in size without focal abnormality. Adrenals/Urinary Tract: Adrenal glands are not well visualized. Bilateral kidneys enhance symmetrically. Subcentimeter hypodensities are too small to characterize. There is a 2.9 cm fluid density lesion within the left kidney that likely represents a simple cyst. No hydronephrosis. No hydroureter. The urinary bladder is unremarkable. Stomach/Bowel: versus ingested material within the gastric lumen. The small bowel is distended with fluid measuring up to 4 cm. The large bowel is distended with gas and fluid. No large or small bowel wall thickening. No  pneumatosis. The appendix is not visualized. Vascular/Lymphatic: No abdominal aorta or iliac aneurysm. Nonspecific mild swirling within the small bowel mesentery (9:59-69) no abdominal, pelvic, or inguinal lymphadenopathy. Reproductive: Prostate is unremarkable. Other: No intraperitoneal free fluid. No intraperitoneal free gas. No organized fluid  collection. Musculoskeletal: No abdominal wall hernia or abnormality No suspicious lytic or blastic osseous lesions. No acute displaced fracture. IMPRESSION: 1. Findings concerning for a bowel obstruction versus ileus. No findings suggest colitis or bowel perforation. Fluid noted within the esophageal lumen. Consider serial abdominal X-rays after PO contrast ingestion as well as enteric tube placement. 2. Findings consistent with known diarrheal state. 3. Nodular like 0.9 cm right lower lobe pulmonary nodule. Non-contrast chest CT at 3-6 months is recommended. If the nodules are stable at time of repeat CT, then future CT at 18-24 months (from today's scan) is considered optional for low-risk patients, but is recommended for high-risk patients. This recommendation follows the consensus statement: Guidelines for Management of Incidental Pulmonary Nodules Detected on CT Images: From the Fleischner Society 2017; Radiology 2017; 284:228-243. 4. No findings to suggest acute traumatic injury. Electronically Signed: By: Tish Frederickson M.D. On: 07/04/2020 23:55   DG CHEST PORT 1 VIEW  Result Date: 07/05/2020 CLINICAL DATA:  NG tube. EXAM: PORTABLE CHEST 1 VIEW COMPARISON:  Abdomen 07/05/2020 CT 07/04/2020. Chest x-ray 07/01/2020. FINDINGS: NG tube noted with tip over the stomach. Contrast noted within the NG tube. Gastric and bowel distention again noted. Minimal infiltrate left mid lung cannot be completely excluded. No pleural effusion or pneumothorax. Heart size stable. Thoracic spine scoliosis and degenerative change. IMPRESSION: 1. NG tube noted with tip over the stomach. Gastric and bowel distention again noted. 2. Minimal infiltrate left mid lung cannot be completely excluded. Electronically Signed   By: Maisie Fus  Register   On: 07/05/2020 07:15   DG Abd Portable 1V-Small Bowel Obstruction Protocol-initial, 8 hr delay  Result Date: 07/05/2020 CLINICAL DATA:  Encounter for small bowel obstruction. 8 hour  delay. EXAM: PORTABLE ABDOMEN - 1 VIEW COMPARISON:  Prior abdomen 07/05/2020.  CT 07/04/2020. FINDINGS: NG tube noted in the stomach. Gastric and small bowel distention noted. Oral contrast is noted throughout the colon and rectum. No free air identified. IMPRESSION: Gastric and small bowel distention noted. Oral contrast is noted throughout the colon and rectum. Electronically Signed   By: Maisie Fus  Register   On: 07/05/2020 11:51   DG Abd Portable 1V-Small Bowel Protocol-Position Verification  Result Date: 07/05/2020 CLINICAL DATA:  NG tube placement EXAM: PORTABLE ABDOMEN - 1 VIEW COMPARISON:  None. FINDINGS: Tip of the NG tube is seen within the distal stomach. Mildly dilated air-filled loops of bowel and gastric bubble is seen throughout. No radio-opaque calculi or other significant radiographic abnormality are seen. IMPRESSION: Tip the NG tube within the distal stomach. Electronically Signed   By: Jonna Clark M.D.   On: 07/05/2020 03:18       Assessment / Plan:    #1 39 yo AA with fetal alcohol syndrome and severe autism with watery stools and small bowel ileus on imaging  NG pulled by pt this am - nursing reports last bowel movement yesterday, no vomiting.  Patient appears comfortable this morning, abdomen benign, bowel sounds decreased.  Patient may always have a component of small bowel distention, with chronic intestinal pseudoobstruction.  #2 mild transaminitis stable #3 status post prior ileocolonic resection reason not known #4 hypokalemia corrected  Plan: Leave NG tube out, start clear liquids, gradually advance as tolerated Check  abdominal films in a.m. Would replete potassium a bit further to optimize motility - K+ 4.0 - 5.0 Start Motegrity 2 mg p.o. daily, if available Continue to trend LFTs    LOS: 4 days   Amy Esterwood PA-C 07/06/2020, 10:31 AM      Attending Physician Note   I have taken an interval history, reviewed the chart and examined the patient. I  agree with the Advanced Practitioner's note, impression and recommendations.   *Chronic intestinal pseudo-obstruction, acute exacerbation with diarrheal illness and hypokalemia, improving *Mild transaminase elevations  Start clear liquids  Target K+ 4.0 - 5.0 to optimize GI motility Motegrity 2 mg po qd, if available Trend LFTs  Claudette Head, MD [facility]

## 2020-07-07 ENCOUNTER — Inpatient Hospital Stay (HOSPITAL_COMMUNITY): Payer: Medicare Other

## 2020-07-07 ENCOUNTER — Encounter (HOSPITAL_COMMUNITY): Payer: Self-pay | Admitting: Internal Medicine

## 2020-07-07 ENCOUNTER — Inpatient Hospital Stay (HOSPITAL_COMMUNITY): Payer: Medicare Other | Admitting: Certified Registered Nurse Anesthetist

## 2020-07-07 ENCOUNTER — Encounter (HOSPITAL_COMMUNITY)
Admission: EM | Disposition: A | Discharge status: Hospice | Payer: Self-pay | Source: Home / Self Care | Attending: Internal Medicine

## 2020-07-07 DIAGNOSIS — K2100 Gastro-esophageal reflux disease with esophagitis, without bleeding: Secondary | ICD-10-CM

## 2020-07-07 DIAGNOSIS — D649 Anemia, unspecified: Secondary | ICD-10-CM

## 2020-07-07 DIAGNOSIS — K31819 Angiodysplasia of stomach and duodenum without bleeding: Secondary | ICD-10-CM

## 2020-07-07 DIAGNOSIS — D62 Acute posthemorrhagic anemia: Secondary | ICD-10-CM

## 2020-07-07 HISTORY — PX: BIOPSY: SHX5522

## 2020-07-07 HISTORY — PX: ESOPHAGOGASTRODUODENOSCOPY: SHX5428

## 2020-07-07 HISTORY — PX: HOT HEMOSTASIS: SHX5433

## 2020-07-07 LAB — CBC WITH DIFFERENTIAL/PLATELET
Abs Immature Granulocytes: 0 10*3/uL (ref 0.00–0.07)
Basophils Absolute: 0 10*3/uL (ref 0.0–0.1)
Basophils Relative: 0 %
Eosinophils Absolute: 0 10*3/uL (ref 0.0–0.5)
Eosinophils Relative: 0 %
HCT: 21.6 % — ABNORMAL LOW (ref 39.0–52.0)
Hemoglobin: 7.1 g/dL — ABNORMAL LOW (ref 13.0–17.0)
Lymphocytes Relative: 29 %
Lymphs Abs: 1.1 10*3/uL (ref 0.7–4.0)
MCH: 31.7 pg (ref 26.0–34.0)
MCHC: 32.9 g/dL (ref 30.0–36.0)
MCV: 96.4 fL (ref 80.0–100.0)
Monocytes Absolute: 0 10*3/uL — ABNORMAL LOW (ref 0.1–1.0)
Monocytes Relative: 0 %
Neutro Abs: 2.6 10*3/uL (ref 1.7–7.7)
Neutrophils Relative %: 71 %
Platelets: 150 10*3/uL (ref 150–400)
RBC: 2.24 MIL/uL — ABNORMAL LOW (ref 4.22–5.81)
RDW: 13.8 % (ref 11.5–15.5)
WBC: 3.7 10*3/uL — ABNORMAL LOW (ref 4.0–10.5)
nRBC: 0 /100 WBC
nRBC: 0.5 % — ABNORMAL HIGH (ref 0.0–0.2)

## 2020-07-07 LAB — GLUCOSE, CAPILLARY
Glucose-Capillary: 43 mg/dL — CL (ref 70–99)
Glucose-Capillary: 52 mg/dL — ABNORMAL LOW (ref 70–99)
Glucose-Capillary: 52 mg/dL — ABNORMAL LOW (ref 70–99)
Glucose-Capillary: 52 mg/dL — ABNORMAL LOW (ref 70–99)
Glucose-Capillary: 57 mg/dL — ABNORMAL LOW (ref 70–99)
Glucose-Capillary: 71 mg/dL (ref 70–99)
Glucose-Capillary: 78 mg/dL (ref 70–99)
Glucose-Capillary: 117 mg/dL — ABNORMAL HIGH (ref 70–99)
Glucose-Capillary: 89 mg/dL (ref 70–99)

## 2020-07-07 LAB — IRON AND TIBC
Iron: 10 ug/dL — ABNORMAL LOW (ref 45–182)
Saturation Ratios: 9 % — ABNORMAL LOW (ref 17.9–39.5)
TIBC: 115 ug/dL — ABNORMAL LOW (ref 250–450)
UIBC: 105 ug/dL

## 2020-07-07 LAB — LACTIC ACID, PLASMA
Lactic Acid, Venous: 2.6 mmol/L (ref 0.5–1.9)
Lactic Acid, Venous: 2.2 mmol/L (ref 0.5–1.9)

## 2020-07-07 LAB — PROTIME-INR
INR: 1.8 — ABNORMAL HIGH (ref 0.8–1.2)
INR: 2.1 — ABNORMAL HIGH (ref 0.8–1.2)
Prothrombin Time: 20.2 seconds — ABNORMAL HIGH (ref 11.4–15.2)
Prothrombin Time: 22.9 seconds — ABNORMAL HIGH (ref 11.4–15.2)

## 2020-07-07 LAB — CBC
HCT: 27 % — ABNORMAL LOW (ref 39.0–52.0)
Hemoglobin: 8.8 g/dL — ABNORMAL LOW (ref 13.0–17.0)
MCH: 31.4 pg (ref 26.0–34.0)
MCHC: 32.6 g/dL (ref 30.0–36.0)
MCV: 96.4 fL (ref 80.0–100.0)
Platelets: 166 10*3/uL (ref 150–400)
RBC: 2.8 MIL/uL — ABNORMAL LOW (ref 4.22–5.81)
RDW: 14.1 % (ref 11.5–15.5)
WBC: 24.2 10*3/uL — ABNORMAL HIGH (ref 4.0–10.5)
nRBC: 0 % (ref 0.0–0.2)

## 2020-07-07 LAB — COMPREHENSIVE METABOLIC PANEL
ALT: 54 U/L — ABNORMAL HIGH (ref 0–44)
AST: 62 U/L — ABNORMAL HIGH (ref 15–41)
Albumin: 1.6 g/dL — ABNORMAL LOW (ref 3.5–5.0)
Alkaline Phosphatase: 121 U/L (ref 38–126)
Anion gap: 7 (ref 5–15)
BUN: 27 mg/dL — ABNORMAL HIGH (ref 6–20)
CO2: 28 mmol/L (ref 22–32)
Calcium: 7.9 mg/dL — ABNORMAL LOW (ref 8.9–10.3)
Chloride: 112 mmol/L — ABNORMAL HIGH (ref 98–111)
Creatinine, Ser: 0.94 mg/dL (ref 0.61–1.24)
GFR, Estimated: >60 mL/min (ref >60)
Glucose, Bld: 107 mg/dL — ABNORMAL HIGH (ref 70–99)
Potassium: 3.9 mmol/L (ref 3.5–5.1)
Sodium: 147 mmol/L — ABNORMAL HIGH (ref 135–145)
Total Bilirubin: 0.2 mg/dL — ABNORMAL LOW (ref 0.3–1.2)
Total Protein: 4.1 g/dL — ABNORMAL LOW (ref 6.5–8.1)

## 2020-07-07 LAB — MAGNESIUM: Magnesium: 1.7 mg/dL (ref 1.7–2.4)

## 2020-07-07 LAB — CULTURE, BLOOD (ROUTINE X 2)
Culture: NO GROWTH
Culture: NO GROWTH
Special Requests: ADEQUATE

## 2020-07-07 LAB — VITAMIN B12: Vitamin B-12: 485 pg/mL (ref 180–914)

## 2020-07-07 LAB — FOLATE: Folate: 7.4 ng/mL (ref >5.9)

## 2020-07-07 LAB — PREPARE RBC (CROSSMATCH)

## 2020-07-07 LAB — PHOSPHORUS
Phosphorus: 2 mg/dL — ABNORMAL LOW (ref 2.5–4.6)
Phosphorus: 3.2 mg/dL (ref 2.5–4.6)

## 2020-07-07 LAB — ABO/RH: ABO/RH(D): A NEG

## 2020-07-07 LAB — FERRITIN: Ferritin: 1047 ng/mL — ABNORMAL HIGH (ref 24–336)

## 2020-07-07 SURGERY — EGD (ESOPHAGOGASTRODUODENOSCOPY)
Anesthesia: Monitor Anesthesia Care

## 2020-07-07 MED ORDER — LIDOCAINE 2% (20 MG/ML) 5 ML SYRINGE
INTRAMUSCULAR | Status: DC | PRN
  Administered 2020-07-07: 20 mg via INTRAVENOUS

## 2020-07-07 MED ORDER — SODIUM CHLORIDE 0.9% IV SOLUTION: Freq: Once | INTRAVENOUS | Status: DC

## 2020-07-07 MED ORDER — IOHEXOL 9 MG/ML PO SOLN
ORAL | Status: AC
  Filled 2020-07-07: qty 500

## 2020-07-07 MED ORDER — ALBUMIN HUMAN 5 % IV SOLN: INTRAVENOUS | Status: DC | PRN

## 2020-07-07 MED ORDER — GLUCAGON HCL RDNA (DIAGNOSTIC) 1 MG IJ SOLR: 1.0000 mg | Freq: Once | INTRAMUSCULAR | Status: AC

## 2020-07-07 MED ORDER — ONDANSETRON HCL 4 MG/2ML IJ SOLN
INTRAMUSCULAR | Status: DC | PRN
  Administered 2020-07-07: 4 mg via INTRAVENOUS

## 2020-07-07 MED ORDER — PANTOPRAZOLE SODIUM 40 MG PO TBEC
40.0000 mg | DELAYED_RELEASE_TABLET | Freq: Two times a day (BID) | ORAL | Status: DC
  Administered 2020-07-07 – 2020-08-17 (×81): 40 mg via ORAL
  Filled 2020-07-07 (×81): qty 1

## 2020-07-07 MED ORDER — POTASSIUM PHOSPHATES 15 MMOLE/5ML IV SOLN
15.0000 mmol | Freq: Once | INTRAVENOUS | Status: AC
  Administered 2020-07-07: 03:00:00 15 mmol via INTRAVENOUS
  Filled 2020-07-07: qty 5

## 2020-07-07 MED ORDER — PROPOFOL 500 MG/50ML IV EMUL: INTRAVENOUS | Status: DC | PRN

## 2020-07-07 MED ORDER — PROPOFOL 10 MG/ML IV BOLUS
INTRAVENOUS | Status: DC | PRN
  Administered 2020-07-07: 20 mg via INTRAVENOUS
  Administered 2020-07-07: 10 mg via INTRAVENOUS

## 2020-07-07 MED ORDER — GLUCAGON HCL RDNA (DIAGNOSTIC) 1 MG IJ SOLR
INTRAMUSCULAR | Status: AC
  Administered 2020-07-07: 13:00:00 1 mg
  Filled 2020-07-07: qty 1

## 2020-07-07 MED ORDER — LACTATED RINGERS IV BOLUS
500.0000 mL | Freq: Once | INTRAVENOUS | Status: AC
  Administered 2020-07-07: 01:00:00 500 mL via INTRAVENOUS

## 2020-07-07 MED ORDER — IOHEXOL 9 MG/ML PO SOLN: 500.0000 mL | ORAL | Status: DC

## 2020-07-07 MED ORDER — PHENYLEPHRINE HCL-NACL 10-0.9 MG/250ML-% IV SOLN
INTRAVENOUS | Status: DC | PRN
  Administered 2020-07-07: 20 ug/min via INTRAVENOUS

## 2020-07-07 MED ORDER — GLUCOSE 40 % PO GEL
ORAL | Status: AC
  Administered 2020-07-07: 12:00:00 37.5 g
  Filled 2020-07-07: qty 1

## 2020-07-07 MED ORDER — LACTATED RINGERS IV SOLN: INTRAVENOUS | Status: DC

## 2020-07-07 MED ORDER — IOHEXOL 300 MG/ML  SOLN
100.0000 mL | Freq: Once | INTRAMUSCULAR | Status: AC | PRN
  Administered 2020-07-07: 07:00:00 100 mL via INTRAVENOUS

## 2020-07-07 MED ORDER — PROPOFOL 500 MG/50ML IV EMUL
INTRAVENOUS | Status: DC | PRN
  Administered 2020-07-07: 40 ug/kg/min via INTRAVENOUS

## 2020-07-07 NOTE — Progress Notes (Addendum)
Subjective:   Overnight, patient's had persistently low blood pressures, MAP 57 with tachycardia. Given 1L bolus with some improvement. Stat CBC, CMP, KUB showed significant hemoglobin drop to 7.1, low phosphate 2.0 and K+ < 4.0, with worsening ileus on imaging. Patient given 1L bolus and CT abdomen/pelvis was ordered. GI to see patient first thing this morning.   Andres [name] states "up", "eat" and "pepsi". Denies pain. Is unable to communicate further during assessment.   Objective:  Vital signs in last 24 hours: Vitals:   07/07/20 0612 07/07/20 0749 07/07/20 0915 07/07/20 1115  BP: 91/67 92/71 91/69    Pulse: (!) 101     Resp:      Temp:  97.9 F (36.6 C) (!) 97.4 F (36.3 C) (!) 97.3 F (36.3 C)  TempSrc:  Oral Axillary Axillary  SpO2:       General: Patient is resting in no acute distress.  Cardiology: RRR. No murmurs, rubs or gallops.  Abdominal: Abdomen feels tight. Bowel sounds are not heard. No tenderness or rebound. DRE shows no external hemorrhoids or rashes with no internal rectal fissures, masses, or hemorrhoids palpated. No gross blood per rectum.  Neurological: Patient is alert.  Musculoskeletal: Patient is severely cachectic, although able to move all four extremities, with soft restraints on upper extremities.   Assessment/Plan:  Active Problems:   Fall   Pressure injury of skin   Altered mental status   Transaminitis   Diarrhea   Small bowel obstruction (HCC)   Ogilvie's syndrome   Protein-calorie malnutrition, severe  # Unwitnessed Fall  Uncertain if mechanical versus LOC. MRI, EEG and electrolytes stable. No further work up at this time beside careful monitoring.   - Continue working with PT/OT: Recommendation for SNF - Soft restraints placed on bilateral upper extremities. Continue careful monitoring.  # Acute Exacerbation of Ogilvie's Syndrome # Chronic post-prandial vomiting # Chronic diarrhea # Chronic bowel incontinence  KUB obtained on  12/10 concerning for acute SBO versus ileus, however small bowel follow through negative. GI consulted given they are following him in the OP setting. NGT clamped and patient was started on clear liquids. Patient complained of pain, nausea yesterday and pulled out NGT. Overnight, no emesis reported, although pressures remained soft with MAP 57 and worsening anemia. CT Abdomen/Pelvis ordered and showed overall improvement in bowel gas pattern without enteric evidence of obstruction. There was an indeterminate 2.3cm enhancing nodule within the midline of the upper abdomen that could represent L gastric artery aneurysm or small spenule; however, no previous CT's to compare to.   - GI following; appreciate their recs - Patient made NPO  - Will start LR 20mL/hr - K+ replaced to maintain > 4.0  - Anti-TTG still pending - May eventually require non-emergent multiphase CTA for further evaluation of nodule  # Acute Normocytic Anemia  Hemoglobin dropped to 7.1 this morning, >6 point decrease from admission. Partially may be in setting of fluids, although higher suspicion of slow upper GI bleeding.   - Will give 1 unit RBC transfusion - Check post-transfusion hemoglobin  - Continue transfusions if hgb < 7.0  # Elevated LFTs LFTs improving. New problem however last CMP in 2019. Viral hepatitis panel negative. No evidence of liver failure.   - Continue to monitor  # Hypophosphatemia Phosphorous dropped from 2.9 to 2.0 this morning. Suspect slow ongoing GI losses although none reported overnight.   - Given 15 mmol IV K-Phos  - Will recheck afternoon   Prior to Admission Living  Arrangement: Home Anticipated Discharge Location: SNF Barriers to Discharge: Placement and continued medical evaluation   Dr. Glenford Bayley Internal Medicine PGY-1  Pager: [phone] After 5pm on weekdays and 1pm on weekends: On Call pager [phone]  07/07/2020, 11:20 AM

## 2020-07-07 NOTE — Interval H&P Note (Signed)
History and Physical Interval Note:  07/07/2020 4:10 PM  Andres [name]  has presented today for surgery, with the diagnosis of worsening anemia.  The various methods of treatment have been discussed with the patient and family. After consideration of risks, benefits and other options for treatment, the patient has consented to  Procedure(s): ESOPHAGOGASTRODUODENOSCOPY (EGD) (N/A) as a surgical intervention.  The patient's history has been reviewed, patient examined, no change in status, stable for surgery.  I have reviewed the patient's chart and labs.  Questions were answered to the patient's satisfaction.     Venita Lick. Russella Dar

## 2020-07-07 NOTE — Transfer of Care (Signed)
Immediate Anesthesia Transfer of Care Note  Patient: Andres [name]  Procedure(s) Performed: ESOPHAGOGASTRODUODENOSCOPY (EGD) (N/A ) HOT HEMOSTASIS (ARGON PLASMA COAGULATION/BICAP) (N/A ) BIOPSY  Patient Location: PACU  Anesthesia Type:MAC  Level of Consciousness: drowsy and patient cooperative  Airway & Oxygen Therapy: Patient Spontanous Breathing  Post-op Assessment: Report given to RN and Post -op Vital signs reviewed and stable  Post vital signs: Reviewed and stable  Last Vitals:  Vitals Value Taken Time  BP    Temp    Pulse    Resp 18 07/07/20 1651  SpO2    Vitals shown include unvalidated device data.  Last Pain:  Vitals:   07/07/20 1603  TempSrc: Axillary  PainSc: 0-No pain         Complications: No complications documented.

## 2020-07-07 NOTE — Anesthesia Preprocedure Evaluation (Signed)
Anesthesia Evaluation  Patient identified by MRN, date of birth, ID band Patient confused    Reviewed: Patient's Chart, lab work & pertinent test results  Airway Mallampati: II  TM Distance: >3 FB Neck ROM: Limited  Mouth opening: Limited Mouth Opening  Dental  (+) Poor Dentition   Pulmonary neg pulmonary ROS,    Pulmonary exam normal        Cardiovascular negative cardio ROS   Rhythm:Regular Rate:Normal     Neuro/Psych Seizures -, Well Controlled,  Depression Autism, cognitive impairment 2/2 fetal ETOH syndrome, non-verbal at baseline    GI/Hepatic Neg liver ROS, GERD  Controlled,  Endo/Other  negative endocrine ROS  Renal/GU negative Renal ROS  negative genitourinary   Musculoskeletal negative musculoskeletal ROS (+)   Abdominal (+)  Abdomen: soft. Bowel sounds: normal.  Peds  (+) Neurological problem Hematology  (+) anemia ,   Anesthesia Other Findings   Reproductive/Obstetrics                             Anesthesia Physical Anesthesia Plan  ASA: III  Anesthesia Plan: MAC   Post-op Pain Management:    Induction: Intravenous  PONV Risk Score and Plan: 1 and Propofol infusion  Airway Management Planned: Simple Face Mask, Natural Airway and Nasal Cannula  Additional Equipment: None  Intra-op Plan:   Post-operative Plan:   Informed Consent: I have reviewed the patients History and Physical, chart, labs and discussed the procedure including the risks, benefits and alternatives for the proposed anesthesia with the patient or authorized representative who has indicated his/her understanding and acceptance.     Dental advisory given  Plan Discussed with: CRNA  Anesthesia Plan Comments: (Lab Results      Component                Value               Date                      WBC                      3.7 (L)             07/07/2020                HGB                       7.1 (L)             07/07/2020                HCT                      21.6 (L)            07/07/2020                MCV                      96.4                07/07/2020                PLT                      150  07/07/2020          )        Anesthesia Quick Evaluation

## 2020-07-07 NOTE — Progress Notes (Signed)
Progress Note   Subjective  Lower than usual BPs overnight with decreasing Hgb. Lactic acid elevated. Brown bowel movements yesterday. No bowel movement or vomiting overnight or today.    Objective  Vital signs in last 24 hours: Temp:  [97.3 F (36.3 C)-99.5 F (37.5 C)] 97.6 F (36.4 C) (12/12 1215) Pulse Rate:  [81-125] 81 (12/12 1215) Resp:  [18-20] 20 (12/12 1215) BP: (81-111)/(36-81) 104/76 (12/12 1215) SpO2:  [98 %-100 %] 100 % (12/12 1215) Last BM Date: 07/06/20  General: Alert, chronically ill aopearing, in NAD Heart:  Regular rate and rhythm; no murmurs Chest: Clear to ascultation bilaterally Abdomen:  Soft, nontender and mildly distended. Hypoactive bowel sounds, without guarding, and without rebound.   Extremities:  Without edema. Neurologic:  Awake alert, very limited verbal communication Psych:  Alert and cooperative. Normal mood and affect.  Intake/Output from previous day: 12/11 0701 - 12/12 0700 In: 1139.3 [IV Piggyback:1139.3] Out: 525 [Urine:525] Intake/Output this shift: No intake/output data recorded.  Lab Results: Recent Labs    07/05/20 0347 07/06/20 0029 07/07/20 0024  WBC 6.5 5.5 3.7*  HGB 10.1* 9.4* 7.1*  HCT 30.3* 28.8* 21.6*  PLT 226 205 150   BMET Recent Labs    07/05/20 0347 07/06/20 0029 07/07/20 0024  NA 142 145 147*  K 3.5 3.6 3.9  CL 106 109 112*  CO2 26 29 28   GLUCOSE 137* 98 107*  BUN 25* 22* 27*  CREATININE 0.73 0.59* 0.94  CALCIUM 8.4* 8.6* 7.9*   LFT Recent Labs    07/07/20 0024  PROT 4.1*  ALBUMIN 1.6*  AST 62*  ALT 54*  ALKPHOS 121  BILITOT 0.2*   PT/INR Recent Labs    07/07/20 0221  LABPROT 20.2*  INR 1.8*   Hepatitis Panel No results for input(s): HEPBSAG, HCVAB, HEPAIGM, HEPBIGM in the last 72 hours.  Studies/Results: CT ABDOMEN PELVIS W CONTRAST  Result Date: 07/07/2020 CLINICAL DATA:  Concern for bowel obstruction. EXAM: CT ABDOMEN AND PELVIS WITH CONTRAST TECHNIQUE: Multidetector CT  imaging of the abdomen and pelvis was performed using the standard protocol following bolus administration of intravenous contrast. CONTRAST:  14/06/2020 OMNIPAQUE IOHEXOL 300 MG/ML  SOLN COMPARISON:  07/04/2020 FINDINGS: Examination is degraded secondary to patient's inability to tolerate standard positioning, overlying upper extremity as well as cachectic state. Lower chest: Limited evaluation of the lower thorax demonstrates trace bilateral effusions with associated bibasilar consolidative opacities, likely atelectasis. Question 9 mm right lower lobe pulmonary nodule is not seen on the present examination and thus may have represented nodular atelectasis. Borderline cardiomegaly. No pericardial effusion. Hepatobiliary: Normal hepatic contour. No discrete hepatic lesions. Normal appearance of the gallbladder given degree distention. No radiopaque gallstones. No intra or extrahepatic biliary duct dilatation. Trace amount of perihepatic fluid/ascites. Pancreas: Appears normal. Spleen: Appears normal. Adrenals/Urinary Tract: There is symmetric enhancement and excretion of the bilateral kidneys. Note is made of an approximately 3.1 cm hypoattenuating nonenhancing left-sided renal cyst which contains a thin internal septation. Punctate subcentimeter right-sided renal lesion is too small to accurately characterize though favored to represent additional renal cysts. No urinary obstruction or perinephric stranding. Normal appearance the bilateral adrenal glands. The urinary bladder appears under distended and thus suboptimally evaluated. Stomach/Bowel: Evaluation of the intestines is degraded secondary to lack of any significant intra-abdominal fat. Ingested enteric contrast now seen extending to the level of the rectum. Previous noted gaseous distension of the large and small bowel is improved compared to the 07/04/2020 examination though there is  persistent patulous distension of several scattered loops of small bowel with  index loop of small bowel within the midline of the lower abdomen/pelvis measuring approximately 4.1 cm in diameter (image 61, series 7). Foci of air within the caudal posterior aspect of the right peritoneal cavity is favored to represent air within the appendix though again evaluation degraded secondary to lack of mesenteric fat (image 51, series 3; coronal image 66, series 7). No pneumoperitoneum, pneumatosis or portal venous gas. No definitive definable/drainable fluid collection within the abdomen or pelvis. Vascular/Lymphatic: There is an approximately 2.3 x 2.1 x 1.6 cm enhancing nodule within the midline of the upper abdomen (coronal image 45, series 7; axial image 10, series 3), which appears extrinsic to the adjacent stomach as well as separate from the hepatic parenchyma and heart and thus is indeterminate differential considerations including an aneurysm arising from the left gastric artery versus a small splenule. This structure is unchanged in hind site compared to the 07/04/2020 examination. Normal caliber of the abdominal aorta. The major branch vessels of the abdominal aorta appear patent on this non CTA examination. No bulky retroperitoneal, mesenteric, pelvic or inguinal lymphadenopathy on this body habitus degraded examination. Reproductive: Suboptimally evaluated due to cachectic state. Other: Diffuse cachexia. Musculoskeletal: No definite acute or aggressive osseous abnormalities. Mild degenerative change the left hip with joint space loss, subchondral sclerosis and osteophytosis. Mild scoliotic curvature of the thoracolumbar spine, potentially positional. IMPRESSION: 1. Degraded examination secondary to patient's inability to tolerate positioning for the CT scan as well as lack of mesenteric fat and cachectic state. 2. Improved bowel gas pattern with persistent patulous distension of several scattered loops of small bowel without evidence of enteric obstruction, nonspecific though findings  suggestive of improved ileus/partial obstruction. 3. Indeterminate approximately 2.3 cm enhancing nodule within the midline of the upper abdomen of uncertain etiology with differential considerations including an aneurysm arising from the left gastric artery versus a small splenule. Correlation with previous outside examinations (if available), is advised. While of uncertain clinical significance, further evaluation with nonemergent multiphase CTA could be performed as indicated. 4. Trace bilateral effusions with associated bibasilar opacities, likely atelectasis. Electronically Signed   By: John  Watts M.D.   On: 07/07/2020 07:52   DG Abd Portable 1V  Result Date: 07/07/2020 CLINICAL DATA:  Hypotension EXAM: PORTABLE ABDOMEN - 1 VIEW COMPARISON:  07/05/2020 FINDINGS: Gaseous distension of bowel diffusely, likely ileus. No free air organomegaly. No suspicious calcification. IMPRESSION: Diffuse gaseous distention of bowel, worsening since prior study, likely ileus. Electronically Signed   By: Kevin  Dover M.D.   On: 07/07/2020 01:40     Assessment & Recommendations   1. Worsening anemia, lower than usual BP, lactic acidosis without overt GI bleeding. Hgb decreased from 13.4 to 7.1 over past 5 days. No source found on CT. Tried to reach caregiver for consent and left VM. Only one contact person with one number in the chart. Keep NPO for now. Trend CBC.  EGD today.  2. Chronic intestinal psuedo-obstruction. KUB slight worsening in bowel distention. Target for K+ is 4.0 to 5.0.    LOS: 5 days   Jhamari Markowicz T. Majesty Oehlert MD  07/07/2020, 1:09 PM 

## 2020-07-07 NOTE — Plan of Care (Signed)

## 2020-07-07 NOTE — Op Note (Addendum)
[facility] Patient Name: Andres [name] Procedure Date : 07/07/2020 MRN: [id number] Attending MD: Meryl Dare , MD Date of Birth: 1980/10/20 CSN: [id number] Age: 39 Admit Type: Inpatient Procedure:                Upper GI endoscopy Indications:              Acute post hemorrhagic anemia, R/O UGI bleed Providers:                Venita Lick. Russella Dar, MD, Margaree Mackintosh, RN,                            Michele Mcalpine Technician Referring MD:             Carlynn Purl, DO Medicines:                Monitored Anesthesia Care Complications:            No immediate complications. Estimated Blood Loss:     Estimated blood loss was minimal. Procedure:                Pre-Anesthesia Assessment:                           - Prior to the procedure, a History and Physical                            was performed, and patient medications and                            allergies were reviewed. The patient's tolerance of                            previous anesthesia was also reviewed. The risks                            and benefits of the procedure and the sedation                            options and risks were discussed with the patient.                            All questions were answered, and informed consent                            was obtained. Prior Anticoagulants: The patient has                            taken no previous anticoagulant or antiplatelet                            agents. ASA Grade Assessment: III - A patient with                            severe systemic disease. After reviewing the risks  and benefits, the patient was deemed in                            satisfactory condition to undergo the procedure.                           After obtaining informed consent, the endoscope was                            passed under direct vision. Throughout the                            procedure, the patient's blood pressure, pulse,  and                            oxygen saturations were monitored continuously. The                            GIF-H190 ([id number]) Olympus gastroscope was                            introduced through the mouth, and advanced to the                            second part of duodenum. The upper GI endoscopy was                            accomplished without difficulty. The patient                            tolerated the procedure well. Scope In: Scope Out: Findings:      LA Grade D (one or more mucosal breaks involving at least 75% of       esophageal circumference) esophagitis with no bleeding was found in the       mid and distal esophagus. Biopsies were taken with a cold forceps for       histology.      The exam of the esophagus was otherwise normal.      A medium-sized hiatal hernia was present.      Three small angiodysplastic lesions with oozing were found in the       gastric fundus. Coagulation for hemostasis using argon plasma was       successful.      The exam of the stomach was otherwise normal. J-shaped stomach.      The duodenal bulb and second portion of the duodenum were normal. Impression:               - LA Grade D reflux esophagitis with no bleeding.                            Biopsied.                           - Medium-sized hiatal hernia.                           -  Three oozing angiodysplastic lesions in the                            stomach. Treated with argon plasma coagulation                            (APC).                           - Normal duodenal bulb and second portion of the                            duodenum. Recommendation:           - Return patient to hospital ward for ongoing care.                           - Clear liquid diet today.                           - Continue present medications.                           - Discontinue famotidine.                           - Start Pantoprazole 40 mg po bid for long term                             mgmt of GERD with esophagitis                           - Antireflux measures long term.                           - Elevate HOB > 30 degrees at all times.                           - Await pathology results.                           - Esophagitis and angiodysplasias are potential                            causes of anemia.                           - Outpatient GI follow up as needed with Dr.                            Meridee ScoreMansouraty. Procedure Code(s):        --- Professional ---                           43255, 59, Esophagogastroduodenoscopy, flexible,                            transoral;  with control of bleeding, any method                           43239, Esophagogastroduodenoscopy, flexible,                            transoral; with biopsy, single or multiple Diagnosis Code(s):        --- Professional ---                           K21.00, Gastro-esophageal reflux disease with                            esophagitis, without bleeding                           K44.9, Diaphragmatic hernia without obstruction or                            gangrene                           K31.811, Angiodysplasia of stomach and duodenum                            with bleeding                           D62, Acute posthemorrhagic anemia CPT copyright 2019 [employer]. All rights reserved. The codes documented in this report are preliminary and upon coder review may  be revised to meet current compliance requirements. Meryl Dare, MD 07/07/2020 4:52:40 PM This report has been signed electronically. Number of Addenda: 0

## 2020-07-07 NOTE — Progress Notes (Addendum)
Paged for patient having MAP of 57. 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 Patient seen at bedside. He denies any abdominal pain or other pain. He knows his name and and would like to be left alone but does ask for pizza. He pulled out his NG tube yesterday morning. Blood pressures were soft earlier today and he received a 1L bolus without much change in blood pressure. He is tachycardic. Unable to auscultate abdomen due to patient preference, but he is non-tender to palpation.  His hemoglobin has been trending down since admission, when it was at 13.4, now down to 9.4 this morning. Per nursing there have been no dark or bloody stools, he is not sure when last stool was. Colonoscopy done 11/2019 due to bloody stools showed non-bleeding hemorrhoids, patent anastomosis and colon consistent with Ogilvie's.   Per chart review he does appear to have chronic blood pressures that range from 90 - 110 systolic and is likely near his baseline.   - additional 1L bolus - stat CBC and CMP  - KUB   Cherlyn Syring A, DO 07/07/2020, 12:46 AM Pager: [phone]   ADDENDUM:   - blood pressure and HR improved s/p bolus - Hemoglobin 7.1, platelets 150. 1U blood ordered, no BRBPR - KUB with worsening ileus, CT abd ordered.  - discuss with GI  - Lactic acid, PT/INR - NPO

## 2020-07-07 NOTE — Progress Notes (Signed)
Pt's BP 91/67 MAP 77, HR 101. [facility] ([facility]) informed. Requesting more frequent BP checks d/t the possibility that the pt may have a bleed undetected at this time.

## 2020-07-07 NOTE — Progress Notes (Signed)
IV consult placed for midline for blood tx. Pt has had multiple IVs placed by IV Team with and without ultrasound that he has pulled out. Pt has wrist restraints and mittens on currently. Pt also has great veins. Peripheral IV placed due to pt pulling out lines. Bedside RN notified. IV Team will remain available as needed.

## 2020-07-07 NOTE — H&P (View-Only) (Signed)
Progress Note   Subjective  Lower than usual BPs overnight with decreasing Hgb. Lactic acid elevated. Brown bowel movements yesterday. No bowel movement or vomiting overnight or today.    Objective  Vital signs in last 24 hours: Temp:  [97.3 F (36.3 C)-99.5 F (37.5 C)] 97.6 F (36.4 C) (12/12 1215) Pulse Rate:  [81-125] 81 (12/12 1215) Resp:  [18-20] 20 (12/12 1215) BP: (81-111)/(36-81) 104/76 (12/12 1215) SpO2:  [98 %-100 %] 100 % (12/12 1215) Last BM Date: 07/06/20  General: Alert, chronically ill aopearing, in NAD Heart:  Regular rate and rhythm; no murmurs Chest: Clear to ascultation bilaterally Abdomen:  Soft, nontender and mildly distended. Hypoactive bowel sounds, without guarding, and without rebound.   Extremities:  Without edema. Neurologic:  Awake alert, very limited verbal communication Psych:  Alert and cooperative. Normal mood and affect.  Intake/Output from previous day: 12/11 0701 - 12/12 0700 In: 1139.3 [IV Piggyback:1139.3] Out: 525 [Urine:525] Intake/Output this shift: No intake/output data recorded.  Lab Results: Recent Labs    07/05/20 0347 07/06/20 0029 07/07/20 0024  WBC 6.5 5.5 3.7*  HGB 10.1* 9.4* 7.1*  HCT 30.3* 28.8* 21.6*  PLT 226 205 150   BMET Recent Labs    07/05/20 0347 07/06/20 0029 07/07/20 0024  NA 142 145 147*  K 3.5 3.6 3.9  CL 106 109 112*  CO2 26 29 28   GLUCOSE 137* 98 107*  BUN 25* 22* 27*  CREATININE 0.73 0.59* 0.94  CALCIUM 8.4* 8.6* 7.9*   LFT Recent Labs    07/07/20 0024  PROT 4.1*  ALBUMIN 1.6*  AST 62*  ALT 54*  ALKPHOS 121  BILITOT 0.2*   PT/INR Recent Labs    07/07/20 0221  LABPROT 20.2*  INR 1.8*   Hepatitis Panel No results for input(s): HEPBSAG, HCVAB, HEPAIGM, HEPBIGM in the last 72 hours.  Studies/Results: CT ABDOMEN PELVIS W CONTRAST  Result Date: 07/07/2020 CLINICAL DATA:  Concern for bowel obstruction. EXAM: CT ABDOMEN AND PELVIS WITH CONTRAST TECHNIQUE: Multidetector CT  imaging of the abdomen and pelvis was performed using the standard protocol following bolus administration of intravenous contrast. CONTRAST:  14/06/2020 OMNIPAQUE IOHEXOL 300 MG/ML  SOLN COMPARISON:  07/04/2020 FINDINGS: Examination is degraded secondary to patient's inability to tolerate standard positioning, overlying upper extremity as well as cachectic state. Lower chest: Limited evaluation of the lower thorax demonstrates trace bilateral effusions with associated bibasilar consolidative opacities, likely atelectasis. Question 9 mm right lower lobe pulmonary nodule is not seen on the present examination and thus may have represented nodular atelectasis. Borderline cardiomegaly. No pericardial effusion. Hepatobiliary: Normal hepatic contour. No discrete hepatic lesions. Normal appearance of the gallbladder given degree distention. No radiopaque gallstones. No intra or extrahepatic biliary duct dilatation. Trace amount of perihepatic fluid/ascites. Pancreas: Appears normal. Spleen: Appears normal. Adrenals/Urinary Tract: There is symmetric enhancement and excretion of the bilateral kidneys. Note is made of an approximately 3.1 cm hypoattenuating nonenhancing left-sided renal cyst which contains a thin internal septation. Punctate subcentimeter right-sided renal lesion is too small to accurately characterize though favored to represent additional renal cysts. No urinary obstruction or perinephric stranding. Normal appearance the bilateral adrenal glands. The urinary bladder appears under distended and thus suboptimally evaluated. Stomach/Bowel: Evaluation of the intestines is degraded secondary to lack of any significant intra-abdominal fat. Ingested enteric contrast now seen extending to the level of the rectum. Previous noted gaseous distension of the large and small bowel is improved compared to the 07/04/2020 examination though there is  persistent patulous distension of several scattered loops of small bowel with  index loop of small bowel within the midline of the lower abdomen/pelvis measuring approximately 4.1 cm in diameter (image 61, series 7). Foci of air within the caudal posterior aspect of the right peritoneal cavity is favored to represent air within the appendix though again evaluation degraded secondary to lack of mesenteric fat (image 51, series 3; coronal image 66, series 7). No pneumoperitoneum, pneumatosis or portal venous gas. No definitive definable/drainable fluid collection within the abdomen or pelvis. Vascular/Lymphatic: There is an approximately 2.3 x 2.1 x 1.6 cm enhancing nodule within the midline of the upper abdomen (coronal image 45, series 7; axial image 10, series 3), which appears extrinsic to the adjacent stomach as well as separate from the hepatic parenchyma and heart and thus is indeterminate differential considerations including an aneurysm arising from the left gastric artery versus a small splenule. This structure is unchanged in hind site compared to the 07/04/2020 examination. Normal caliber of the abdominal aorta. The major branch vessels of the abdominal aorta appear patent on this non CTA examination. No bulky retroperitoneal, mesenteric, pelvic or inguinal lymphadenopathy on this body habitus degraded examination. Reproductive: Suboptimally evaluated due to cachectic state. Other: Diffuse cachexia. Musculoskeletal: No definite acute or aggressive osseous abnormalities. Mild degenerative change the left hip with joint space loss, subchondral sclerosis and osteophytosis. Mild scoliotic curvature of the thoracolumbar spine, potentially positional. IMPRESSION: 1. Degraded examination secondary to patient's inability to tolerate positioning for the CT scan as well as lack of mesenteric fat and cachectic state. 2. Improved bowel gas pattern with persistent patulous distension of several scattered loops of small bowel without evidence of enteric obstruction, nonspecific though findings  suggestive of improved ileus/partial obstruction. 3. Indeterminate approximately 2.3 cm enhancing nodule within the midline of the upper abdomen of uncertain etiology with differential considerations including an aneurysm arising from the left gastric artery versus a small splenule. Correlation with previous outside examinations (if available), is advised. While of uncertain clinical significance, further evaluation with nonemergent multiphase CTA could be performed as indicated. 4. Trace bilateral effusions with associated bibasilar opacities, likely atelectasis. Electronically Signed   By: Simonne Come M.D.   On: 07/07/2020 07:52   DG Abd Portable 1V  Result Date: 07/07/2020 CLINICAL DATA:  Hypotension EXAM: PORTABLE ABDOMEN - 1 VIEW COMPARISON:  07/05/2020 FINDINGS: Gaseous distension of bowel diffusely, likely ileus. No free air organomegaly. No suspicious calcification. IMPRESSION: Diffuse gaseous distention of bowel, worsening since prior study, likely ileus. Electronically Signed   By: Charlett Nose M.D.   On: 07/07/2020 01:40     Assessment & Recommendations   1. Worsening anemia, lower than usual BP, lactic acidosis without overt GI bleeding. Hgb decreased from 13.4 to 7.1 over past 5 days. No source found on CT. Tried to reach caregiver for consent and left VM. Only one contact person with one number in the chart. Keep NPO for now. Trend CBC.  EGD today.  2. Chronic intestinal psuedo-obstruction. KUB slight worsening in bowel distention. Target for K+ is 4.0 to 5.0.    LOS: 5 days   Geneal Huebert T. Russella Dar MD  07/07/2020, 1:09 PM

## 2020-07-07 NOTE — Progress Notes (Signed)
Pt w/ critical lactic acid of 2.6, [facility] ([facility]) team informed.

## 2020-07-07 NOTE — Progress Notes (Signed)
Pt did receive 1 unit PRBC today d/t "PIV issues" [place] ([facility]) informed.

## 2020-07-07 NOTE — Anesthesia Postprocedure Evaluation (Signed)
Anesthesia Post Note  Patient: Marquavis Hannen  Procedure(s) Performed: ESOPHAGOGASTRODUODENOSCOPY (EGD) (N/A ) HOT HEMOSTASIS (ARGON PLASMA COAGULATION/BICAP) (N/A ) BIOPSY     Patient location during evaluation: Endoscopy Anesthesia Type: MAC Level of consciousness: awake and alert Pain management: pain level controlled Vital Signs Assessment: post-procedure vital signs reviewed and stable Respiratory status: spontaneous breathing, nonlabored ventilation, respiratory function stable and patient connected to nasal cannula oxygen Cardiovascular status: blood pressure returned to baseline and stable Postop Assessment: no apparent nausea or vomiting Anesthetic complications: no   No complications documented.  Last Vitals:  Vitals:   07/07/20 1720 07/07/20 1735  BP: 94/68 117/77  Pulse: 61 76  Resp: 19 (!) 22  Temp:  (!) 36.2 C  SpO2: 100% 96%    Last Pain:  Vitals:   07/07/20 1735  TempSrc:   PainSc: 0-No pain                 Belenda Cruise P Aela Bohan

## 2020-07-07 NOTE — Anesthesia Procedure Notes (Signed)
Procedure Name: MAC Date/Time: 07/07/2020 4:12 PM Performed by: Janace Litten, CRNA Pre-anesthesia Checklist: Patient identified, Emergency Drugs available, Suction available and Patient being monitored Patient Re-evaluated:Patient Re-evaluated prior to induction Oxygen Delivery Method: Nasal cannula

## 2020-07-08 DIAGNOSIS — K567 Ileus, unspecified: Secondary | ICD-10-CM

## 2020-07-08 DIAGNOSIS — K31819 Angiodysplasia of stomach and duodenum without bleeding: Secondary | ICD-10-CM | POA: Diagnosis not present

## 2020-07-08 DIAGNOSIS — R4182 Altered mental status, unspecified: Secondary | ICD-10-CM

## 2020-07-08 DIAGNOSIS — K5981 Ogilvie syndrome: Secondary | ICD-10-CM | POA: Diagnosis not present

## 2020-07-08 DIAGNOSIS — K2100 Gastro-esophageal reflux disease with esophagitis, without bleeding: Secondary | ICD-10-CM

## 2020-07-08 DIAGNOSIS — D62 Acute posthemorrhagic anemia: Secondary | ICD-10-CM

## 2020-07-08 LAB — CBC WITH DIFFERENTIAL/PLATELET
Abs Immature Granulocytes: 0.16 10*3/uL — ABNORMAL HIGH (ref 0.00–0.07)
Basophils Absolute: 0.1 10*3/uL (ref 0.0–0.1)
Basophils Relative: 0 %
Eosinophils Absolute: 0 10*3/uL (ref 0.0–0.5)
Eosinophils Relative: 0 %
HCT: 28.4 % — ABNORMAL LOW (ref 39.0–52.0)
Hemoglobin: 9.6 g/dL — ABNORMAL LOW (ref 13.0–17.0)
Immature Granulocytes: 1 %
Lymphocytes Relative: 3 %
Lymphs Abs: 0.6 10*3/uL — ABNORMAL LOW (ref 0.7–4.0)
MCH: 31.2 pg (ref 26.0–34.0)
MCHC: 33.8 g/dL (ref 30.0–36.0)
MCV: 92.2 fL (ref 80.0–100.0)
Monocytes Absolute: 0.7 10*3/uL (ref 0.1–1.0)
Monocytes Relative: 4 %
Neutro Abs: 19.4 10*3/uL — ABNORMAL HIGH (ref 1.7–7.7)
Neutrophils Relative %: 92 %
Platelets: 144 10*3/uL — ABNORMAL LOW (ref 150–400)
RBC: 3.08 MIL/uL — ABNORMAL LOW (ref 4.22–5.81)
RDW: 15.4 % (ref 11.5–15.5)
WBC: 21 10*3/uL — ABNORMAL HIGH (ref 4.0–10.5)
nRBC: 0.1 % (ref 0.0–0.2)

## 2020-07-08 LAB — BPAM RBC
Blood Product Expiration Date: 202112142359
Blood Product Expiration Date: 202112242359
ISSUE DATE / TIME: 202112122008
ISSUE DATE / TIME: 202112122254
Unit Type and Rh: 600
Unit Type and Rh: 600

## 2020-07-08 LAB — TYPE AND SCREEN
ABO/RH(D): A NEG
Antibody Screen: NEGATIVE
Unit division: 0
Unit division: 0

## 2020-07-08 LAB — GLUCOSE, CAPILLARY
Glucose-Capillary: 114 mg/dL — ABNORMAL HIGH (ref 70–99)
Glucose-Capillary: 116 mg/dL — ABNORMAL HIGH (ref 70–99)
Glucose-Capillary: 121 mg/dL — ABNORMAL HIGH (ref 70–99)
Glucose-Capillary: 138 mg/dL — ABNORMAL HIGH (ref 70–99)
Glucose-Capillary: 148 mg/dL — ABNORMAL HIGH (ref 70–99)
Glucose-Capillary: 52 mg/dL — ABNORMAL LOW (ref 70–99)
Glucose-Capillary: 66 mg/dL — ABNORMAL LOW (ref 70–99)
Glucose-Capillary: 87 mg/dL (ref 70–99)
Glucose-Capillary: 93 mg/dL (ref 70–99)

## 2020-07-08 LAB — COMPREHENSIVE METABOLIC PANEL
ALT: 70 U/L — ABNORMAL HIGH (ref 0–44)
AST: 93 U/L — ABNORMAL HIGH (ref 15–41)
Albumin: 1.9 g/dL — ABNORMAL LOW (ref 3.5–5.0)
Alkaline Phosphatase: 84 U/L (ref 38–126)
Anion gap: 9 (ref 5–15)
BUN: 31 mg/dL — ABNORMAL HIGH (ref 6–20)
CO2: 25 mmol/L (ref 22–32)
Calcium: 8.1 mg/dL — ABNORMAL LOW (ref 8.9–10.3)
Chloride: 107 mmol/L (ref 98–111)
Creatinine, Ser: 0.62 mg/dL (ref 0.61–1.24)
GFR, Estimated: >60 mL/min (ref >60)
Glucose, Bld: 110 mg/dL — ABNORMAL HIGH (ref 70–99)
Potassium: 3.6 mmol/L (ref 3.5–5.1)
Sodium: 141 mmol/L (ref 135–145)
Total Bilirubin: 1.2 mg/dL (ref 0.3–1.2)
Total Protein: 4.7 g/dL — ABNORMAL LOW (ref 6.5–8.1)

## 2020-07-08 LAB — PATHOLOGIST SMEAR REVIEW

## 2020-07-08 MED ORDER — ENSURE ENLIVE PO LIQD
237.0000 mL | Freq: Three times a day (TID) | ORAL | Status: DC
  Administered 2020-07-08 – 2020-07-11 (×9): 237 mL via ORAL
  Filled 2020-07-08 (×3): qty 237

## 2020-07-08 MED ORDER — ADULT MULTIVITAMIN W/MINERALS CH
1.0000 | ORAL_TABLET | Freq: Every day | ORAL | Status: DC
  Administered 2020-07-08 – 2020-08-07 (×31): 1 via ORAL
  Filled 2020-07-08 (×31): qty 1

## 2020-07-08 MED ORDER — POTASSIUM CHLORIDE CRYS ER 20 MEQ PO TBCR
40.0000 meq | EXTENDED_RELEASE_TABLET | Freq: Once | ORAL | Status: AC
  Administered 2020-07-08: 11:00:00 40 meq via ORAL
  Filled 2020-07-08: qty 2

## 2020-07-08 NOTE — Plan of Care (Signed)
Max assist with adls, continues to interfere with operation maintenance of IV.

## 2020-07-08 NOTE — Progress Notes (Signed)
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  ongoing  GOAL:   Patient will meet greater than or equal to 90% of their needs  progressing  MONITOR:   Diet advancement,Labs,Weight trends,Skin,I & O's  REASON FOR ASSESSMENT:   Consult Assessment of nutrition requirement/status,Wound healing  ASSESSMENT:   Mr. 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 He was admitted for further work-up and management.  Pt admitted s/p fall.   12/10- CT of abdomen and pelvis revealed SBO vs ileus, NGT placed 12/11- pt removed NGT 12/12- EGD, pt with severe, nonbleeding esophagitis, biopsied  GI advanced pt to a soft diet and signed off today. No meal completions documented since pt's diet was advanced today, but pt is charted to have consumed 10-100% (65% average intake) of the 4 regular trays/snacks he received prior to being made NPO/clear liquids. Will order oral nutrition supplements to provide pt with additional kcals/protein now that diet has been advanced.   UOP: documented today  Labs and medications reviewed.   Diet Order:   Diet Order            DIET SOFT Room service appropriate? No; Fluid consistency: Thin  Diet effective now                 EDUCATION NEEDS:   Not appropriate for education at this time  Skin:  Skin Assessment:  Skin Integrity Issues: Skin Integrity Issues:: Stage II,Other (Comment) Stage II: sacrum, rt proximal hip, upper back Other: friction wound to mid upper back  Last BM:  07/07/20 type 7  Height:   Ht Readings from Last 1 Encounters:  07/07/20 5\' 6"  (1.676 m)    Weight:   Wt Readings from Last 1 Encounters:  07/07/20 46.8 kg    Ideal Body Weight:  61.8 kg  BMI:  Body mass index is 16.65 kg/m.  Estimated Nutritional Needs:   Kcal:  1850-2050  Protein:  90-105 grams  Fluid:  > 1.8 L    14/12/21, MS, RD, LDN RD pager number and weekend/on-call pager number located in [place].

## 2020-07-08 NOTE — Progress Notes (Addendum)
Daily Rounding Note  07/08/2020, 12:46 PM  LOS: 6 days   SUBJECTIVE:   Chief complaint:    Anemia, GI bleed Verbalizing "eat".  Nods no to question if he is having any pain  OBJECTIVE:         Vital signs in last 24 hours:    Temp:  [97.2 F (36.2 C)-98.1 F (36.7 C)] 97.8 F (36.6 C) (12/13 1131) Pulse Rate:  [58-97] 58 (12/13 0929) Resp:  [12-22] 14 (12/13 0929) BP: (87-117)/(52-86) 103/74 (12/13 0747) SpO2:  [96 %-100 %] 98 % (12/13 0929) Weight:  [46.8 kg] 46.8 kg (12/12 1603) Last BM Date: 07/07/20 Filed Weights   07/07/20 1603  Weight: 46.8 kg   General: Alert.  Comfortable.  No distress Heart: RRR Chest: No labored breathing or cough Abdomen: Active bowel sounds, no distention.  Not tender. Extremities: Legs in a contractured state. Neuro/Psych: Limited speech.  Follows some simple commands but not consistently.  Intake/Output from previous day: 12/12 0701 - 12/13 0700 In: 2690 [P.O.:240; I.V.:200; Blood:500; IV Piggyback:250] Out: 0   Intake/Output this shift: Total I/O In: -  Out: 550 [Urine:550]  Lab Results: Recent Labs    07/07/20 0024 07/07/20 2042 07/08/20 0408  WBC 3.7* 24.2* 21.0*  HGB 7.1* 8.8* 9.6*  HCT 21.6* 27.0* 28.4*  PLT 150 166 144*   BMET Recent Labs    07/06/20 0029 07/07/20 0024 07/08/20 0408  NA 145 147* 141  K 3.6 3.9 3.6  CL 109 112* 107  CO2 29 28 25   GLUCOSE 98 107* 110*  BUN 22* 27* 31*  CREATININE 0.59* 0.94 0.62  CALCIUM 8.6* 7.9* 8.1*   LFT Recent Labs    07/06/20 0029 07/07/20 0024 07/08/20 0408  PROT 5.1* 4.1* 4.7*  ALBUMIN 2.1* 1.6* 1.9*  AST 74* 62* 93*  ALT 69* 54* 70*  ALKPHOS 67 121 84  BILITOT 0.8 0.2* 1.2   PT/INR Recent Labs    07/07/20 0221 07/07/20 2042  LABPROT 20.2* 22.9*  INR 1.8* 2.1*   Hepatitis Panel No results for input(s): HEPBSAG, HCVAB, HEPAIGM, HEPBIGM in the last 72 hours.  Studies/Results: CT ABDOMEN  PELVIS W CONTRAST  Result Date: 07/07/2020 CLINICAL DATA:  Concern for bowel obstruction. EXAM: CT ABDOMEN AND PELVIS WITH CONTRAST TECHNIQUE: Multidetector CT imaging of the abdomen and pelvis was performed using the standard protocol following bolus administration of intravenous contrast. CONTRAST:  14/06/2020 OMNIPAQUE IOHEXOL 300 MG/ML  SOLN COMPARISON:  07/04/2020 FINDINGS: Examination is degraded secondary to patient's inability to tolerate standard positioning, overlying upper extremity as well as cachectic state. Lower chest: Limited evaluation of the lower thorax demonstrates trace bilateral effusions with associated bibasilar consolidative opacities, likely atelectasis. Question 9 mm right lower lobe pulmonary nodule is not seen on the present examination and thus may have represented nodular atelectasis. Borderline cardiomegaly. No pericardial effusion. Hepatobiliary: Normal hepatic contour. No discrete hepatic lesions. Normal appearance of the gallbladder given degree distention. No radiopaque gallstones. No intra or extrahepatic biliary duct dilatation. Trace amount of perihepatic fluid/ascites. Pancreas: Appears normal. Spleen: Appears normal. Adrenals/Urinary Tract: There is symmetric enhancement and excretion of the bilateral kidneys. Note is made of an approximately 3.1 cm hypoattenuating nonenhancing left-sided renal cyst which contains a thin internal septation. Punctate subcentimeter right-sided renal lesion is too small to accurately characterize though favored to represent additional renal cysts. No urinary obstruction or perinephric stranding. Normal appearance the bilateral adrenal glands. The urinary bladder appears under  distended and thus suboptimally evaluated. Stomach/Bowel: Evaluation of the intestines is degraded secondary to lack of any significant intra-abdominal fat. Ingested enteric contrast now seen extending to the level of the rectum. Previous noted gaseous distension of the large  and small bowel is improved compared to the 07/04/2020 examination though there is persistent patulous distension of several scattered loops of small bowel with index loop of small bowel within the midline of the lower abdomen/pelvis measuring approximately 4.1 cm in diameter (image 61, series 7). Foci of air within the caudal posterior aspect of the right peritoneal cavity is favored to represent air within the appendix though again evaluation degraded secondary to lack of mesenteric fat (image 51, series 3; coronal image 66, series 7). No pneumoperitoneum, pneumatosis or portal venous gas. No definitive definable/drainable fluid collection within the abdomen or pelvis. Vascular/Lymphatic: There is an approximately 2.3 x 2.1 x 1.6 cm enhancing nodule within the midline of the upper abdomen (coronal image 45, series 7; axial image 10, series 3), which appears extrinsic to the adjacent stomach as well as separate from the hepatic parenchyma and heart and thus is indeterminate differential considerations including an aneurysm arising from the left gastric artery versus a small splenule. This structure is unchanged in hind site compared to the 07/04/2020 examination. Normal caliber of the abdominal aorta. The major branch vessels of the abdominal aorta appear patent on this non CTA examination. No bulky retroperitoneal, mesenteric, pelvic or inguinal lymphadenopathy on this body habitus degraded examination. Reproductive: Suboptimally evaluated due to cachectic state. Other: Diffuse cachexia. Musculoskeletal: No definite acute or aggressive osseous abnormalities. Mild degenerative change the left hip with joint space loss, subchondral sclerosis and osteophytosis. Mild scoliotic curvature of the thoracolumbar spine, potentially positional. IMPRESSION: 1. Degraded examination secondary to patient's inability to tolerate positioning for the CT scan as well as lack of mesenteric fat and cachectic state. 2. Improved bowel  gas pattern with persistent patulous distension of several scattered loops of small bowel without evidence of enteric obstruction, nonspecific though findings suggestive of improved ileus/partial obstruction. 3. Indeterminate approximately 2.3 cm enhancing nodule within the midline of the upper abdomen of uncertain etiology with differential considerations including an aneurysm arising from the left gastric artery versus a small splenule. Correlation with previous outside examinations (if available), is advised. While of uncertain clinical significance, further evaluation with nonemergent multiphase CTA could be performed as indicated. 4. Trace bilateral effusions with associated bibasilar opacities, likely atelectasis. Electronically Signed   By: Simonne Come M.D.   On: 07/07/2020 07:52   DG Abd Portable 1V  Result Date: 07/07/2020 CLINICAL DATA:  Hypotension EXAM: PORTABLE ABDOMEN - 1 VIEW COMPARISON:  07/05/2020 FINDINGS: Gaseous distension of bowel diffusely, likely ileus. No free air organomegaly. No suspicious calcification. IMPRESSION: Diffuse gaseous distention of bowel, worsening since prior study, likely ileus. Electronically Signed   By: Charlett Nose M.D.   On: 07/07/2020 01:40    ASSESMENT:   *     Upper GI bleed. 07/07/2020 EGD. Severe, nonbleeding esophagitis, biopsied. 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  *    Severe autism, fetal alcohol syndrome, mental retardation. Minimally verbal at baseline.  *    Blood loss anemia.  Hgb 7.1 >> 1 PRBC >> 9.6   PLAN   *     Protonix 40 mg, or other full dose PPI, twice daily for long-term management of GERD/esophagitis. Keep the head of the bed greater than 30 degrees at all times as part of antireflux  measures. I advanced from clear liquids to a soft diet. Can follow-up as needed with GI at the office, Dr. Meridee Score GI signing off.      Jennye Moccasin  07/08/2020, 12:46 PM Phone [phone]   Attending physician's note    I have taken an interval history, reviewed the chart and examined the patient. I agree with the Advanced Practitioner's note, impression and recommendations.   S/p EGD yesterday with evidence of severe erosive esophagitis and AVM in the stomach treated with APC Hemoglobin improved s/p PRBC transfusion  Advance diet as tolerated  Follow-up with Dr. Meridee Score as outpatient  GI will sign off, available if have any questions    I have spent 25 minutes of patient care (this includes precharting, chart review, review of results, face-to-face time used for counseling as well as treatment plan and follow-up. The patient was provided an opportunity to ask questions and all were answered. The patient agreed with the plan and demonstrated an understanding of the instructions.  Iona Beard , MD [phone]

## 2020-07-08 NOTE — Progress Notes (Signed)
   Subjective:   No acute events reported overnight. He received 1 unit RBC's since yesterday and nursing deny signs of bleeding. Patient continues to say "eat" and "pepsi" and denies pain. Is unable to communicate further during assessment.   Objective:  Vital signs in last 24 hours: Vitals:   07/08/20 0347 07/08/20 0747 07/08/20 0929 07/08/20 1131  BP: 104/70 103/74    Pulse: 74 64 (!) 58   Resp: 15 16 14    Temp: 97.9 F (36.6 C) 97.8 F (36.6 C)  97.8 F (36.6 C)  TempSrc: Axillary Axillary    SpO2: 98% 99% 98%   Weight:      Height:       General: Patient is resting in no acute distress.  Cardiology: Patient refuses cardiac examination today but is in NSR per telemetry.  Abdominal: Patient refuses abdominal examination today.  Neurological: Patient is alert and able to track with eyes and speak. Musculoskeletal: Patient is severely cachectic, although able to move all four extremities, with soft restraints on upper extremities.   Assessment/Plan:  Active Problems:   Fall   Pressure injury of skin   Altered mental status   Transaminitis   Diarrhea   Small bowel obstruction (HCC)   Ogilvie's syndrome   Protein-calorie malnutrition, severe   Acute post-hemorrhagic anemia   Gastric AVM   Gastroesophageal reflux disease with esophagitis without hemorrhage  # Unwitnessed Fall  Uncertain if mechanical versus LOC. MRI, EEG and electrolytes stable. 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   - Continue working with PT/OT: Recommendation for SNF - Soft restraints placed on bilateral upper extremities. Continue careful monitoring.  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 Patient was advanced from NPO to clear liquid diet and continues to request food without new nausea/diarrhea. Hiatal hernia and Grade D Esophageal  Reflux may be contributing to chronic vomiting. Anti-TTG negative.   - GI consulted and have signed off.  - Will advance to soft diet to assess tolerance  - Continue LR 15mL/hr until patient able to self-sustain fluid intake  - Maintain K+ > 4.0  - May eventually require non-emergent multiphase CTA outpatient for further evaluation of 2.3cm enhancing nodule in the midline upper abdomen to rule out aneurysm vs. splenule   # Upper GI Bleed 2/2 Angiodysplasia, Stable   # Acute Normocytic Anemia, Improving   Hemoglobin improved from 7.1 to 9.6 today, status post single RBC transfusion. EGD showed 3 oozing angiodysplasias of the gastric fundus, likely cause of his bleeding.   - Continue to monitor daily CBC's - Continue transfusions if hgb < 7.0  # Liver Failure  Patient has had persistent, mild elevations in LFT's and has elevated INR and mild thrombocytopenia. LFTs stable. New problem since last CMP in 2019. Viral hepatitis panel negative. RUQ ultrasound negative aside from gallbladder polyp. CT abdomen with only mild perihepatic fluid/ascites. Consider AI hepatitis.   - Will check autoimmune hepatic studies  Prior to Admission Living Arrangement: Home Anticipated Discharge Location: SNF Barriers to Discharge: Placement and continued medical evaluation   Dr. 2020 Internal Medicine PGY-1  Pager: [phone] After 5pm on weekdays and 1pm on weekends: On Call pager [phone]  07/08/2020, 3:04 PM

## 2020-07-08 NOTE — Plan of Care (Signed)

## 2020-07-08 NOTE — Plan of Care (Signed)
  Problem: Coping: Goal: Level of anxiety will decrease Outcome: Not Progressing   Problem: Pain Managment: Goal: General experience of comfort will improve Outcome: Not Progressing   Problem: Skin Integrity: Goal: Risk for impaired skin integrity will decrease Outcome: Not Progressing   Problem: Safety: Goal: Non-violent Restraint(s) Outcome: Not Progressing

## 2020-07-09 ENCOUNTER — Encounter: Payer: Self-pay | Admitting: Gastroenterology

## 2020-07-09 DIAGNOSIS — K5981 Ogilvie syndrome: Secondary | ICD-10-CM | POA: Diagnosis not present

## 2020-07-09 DIAGNOSIS — E43 Unspecified severe protein-calorie malnutrition: Secondary | ICD-10-CM | POA: Diagnosis not present

## 2020-07-09 DIAGNOSIS — R7401 Elevation of levels of liver transaminase levels: Secondary | ICD-10-CM | POA: Diagnosis not present

## 2020-07-09 LAB — COMPREHENSIVE METABOLIC PANEL
ALT: 63 U/L — ABNORMAL HIGH (ref 0–44)
AST: 57 U/L — ABNORMAL HIGH (ref 15–41)
Albumin: 1.8 g/dL — ABNORMAL LOW (ref 3.5–5.0)
Alkaline Phosphatase: 84 U/L (ref 38–126)
Anion gap: 6 (ref 5–15)
BUN: 23 mg/dL — ABNORMAL HIGH (ref 6–20)
CO2: 28 mmol/L (ref 22–32)
Calcium: 8.6 mg/dL — ABNORMAL LOW (ref 8.9–10.3)
Chloride: 105 mmol/L (ref 98–111)
Creatinine, Ser: 0.69 mg/dL (ref 0.61–1.24)
GFR, Estimated: >60 mL/min (ref >60)
Glucose, Bld: 168 mg/dL — ABNORMAL HIGH (ref 70–99)
Potassium: 3.5 mmol/L (ref 3.5–5.1)
Sodium: 139 mmol/L (ref 135–145)
Total Bilirubin: 0.6 mg/dL (ref 0.3–1.2)
Total Protein: 4.6 g/dL — ABNORMAL LOW (ref 6.5–8.1)

## 2020-07-09 LAB — CBC WITH DIFFERENTIAL/PLATELET
Abs Immature Granulocytes: 0 10*3/uL (ref 0.00–0.07)
Basophils Absolute: 0 10*3/uL (ref 0.0–0.1)
Basophils Relative: 0 %
Eosinophils Absolute: 0 10*3/uL (ref 0.0–0.5)
Eosinophils Relative: 0 %
HCT: 28.9 % — ABNORMAL LOW (ref 39.0–52.0)
Hemoglobin: 9.7 g/dL — ABNORMAL LOW (ref 13.0–17.0)
Lymphocytes Relative: 4 %
Lymphs Abs: 0.5 10*3/uL — ABNORMAL LOW (ref 0.7–4.0)
MCH: 30.9 pg (ref 26.0–34.0)
MCHC: 33.6 g/dL (ref 30.0–36.0)
MCV: 92 fL (ref 80.0–100.0)
Monocytes Absolute: 0.5 10*3/uL (ref 0.1–1.0)
Monocytes Relative: 4 %
Neutro Abs: 11.2 10*3/uL — ABNORMAL HIGH (ref 1.7–7.7)
Neutrophils Relative %: 92 %
Platelets: 132 10*3/uL — ABNORMAL LOW (ref 150–400)
RBC: 3.14 MIL/uL — ABNORMAL LOW (ref 4.22–5.81)
RDW: 15.7 % — ABNORMAL HIGH (ref 11.5–15.5)
WBC: 12.2 10*3/uL — ABNORMAL HIGH (ref 4.0–10.5)
nRBC: 0 % (ref 0.0–0.2)
nRBC: 0 /100 WBC

## 2020-07-09 LAB — GLUCOSE, CAPILLARY
Glucose-Capillary: 145 mg/dL — ABNORMAL HIGH (ref 70–99)
Glucose-Capillary: 130 mg/dL — ABNORMAL HIGH (ref 70–99)
Glucose-Capillary: 172 mg/dL — ABNORMAL HIGH (ref 70–99)
Glucose-Capillary: 157 mg/dL — ABNORMAL HIGH (ref 70–99)
Glucose-Capillary: 163 mg/dL — ABNORMAL HIGH (ref 70–99)

## 2020-07-09 LAB — SURGICAL PATHOLOGY

## 2020-07-09 MED ORDER — POTASSIUM CHLORIDE CRYS ER 20 MEQ PO TBCR
40.0000 meq | EXTENDED_RELEASE_TABLET | Freq: Once | ORAL | Status: AC
  Administered 2020-07-09: 06:00:00 40 meq via ORAL
  Filled 2020-07-09: qty 2

## 2020-07-09 NOTE — Progress Notes (Signed)
Subjective:   No acute events reported overnight. Andres [name] continues to state "food" and "pizza" and says yes to being hungry and thirsty. Denies any pain or nausea. Nursing report patient has not had BM in a couple of days.   Objective:  Vital signs in last 24 hours: Vitals:   07/08/20 2353 07/09/20 0414 07/09/20 0805 07/09/20 1136  BP: 119/83 110/75 109/71 113/85  Pulse: 62 (!) 49 (!) 57 82  Resp: 18 17 18 18   Temp: (!) 97.4 F (36.3 C) 97.8 F (36.6 C) 98.6 F (37 C) 98 F (36.7 C)  TempSrc: Axillary Axillary Oral Axillary  SpO2: 98% 97% 100% 100%  Weight:      Height:       General: Patient is resting in no acute distress.  Neurological: Patient is alert and able to track with eyes and speak. Musculoskeletal: There is diffuse non-pitting edema of the right hand without obvious tenderness to palpation. Soft restraint in place on the RUE although patient is able to move all four extremities. Severe cachexia  Skin: No jaundice. No purpura.   Assessment/Plan:  Active Problems:   Fall   Pressure injury of skin   Altered mental status   Transaminitis   Diarrhea   Small bowel obstruction (HCC)   Ogilvie's syndrome   Protein-calorie malnutrition, severe   Acute post-hemorrhagic anemia   Gastric AVM   Gastroesophageal reflux disease with esophagitis without hemorrhage   Ileus (HCC)  # Unwitnessed Fall  Uncertain if mechanical versus LOC. MRI, EEG and electrolytes stable. 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 Currently advancing diet and continues to request food without new nausea/diarrhea.   - GI consulted and have signed off.  - Started regular diet  - Discontinued fluids, encourage PO intake  - K+ replaced to maintain >/= 4.0.  - May eventually require non-emergent multiphase CTA outpatient for further evaluation of 2.3cm enhancing nodule in the midline upper abdomen to rule out aneurysm vs. splenule   # GERD w/ Severe Erosive Esophagitis Patient found to have Grade D Reflux w/ esophagitis on EGD. Patient tolerating diet well without pain.  - Protonix 40mg  twice daily, long-term  - Head of bed > 30 degrees at all times  - GI signed off, appreciate their recommendations   # Upper GI Bleed 2/2 Angiodysplasia, Resolved # Acute Normocytic Anemia, Stable EGD showed 3 oozing angiodysplasias of the gastric fundus, likely cause of his bleeding, treated with APC.   - Continue to monitor daily CBC's - Continue transfusions if hgb < 7.0 - Continue treatment as above - Follow up outpatient with Dr. [name]   # Liver Failure  Patient has had persistent, mild elevations in LFT's and has elevated INR and mild thrombocytopenia. LFTs stable. New problem since last CMP in 2019. Viral hepatitis panel negative. RUQ ultrasound negative aside from gallbladder polyp. CT abdomen with only mild perihepatic fluid/ascites. Consider AI hepatitis.   - Check Anti-SMA IgG, Antimicrosomal Ab, Mitochondrial Abs - Check ANA w/ reflex if positive  - Check IgG   #  Severe Malnutrition BMI 16.65 with low albumin. Nutrition on board.   - Ensure Enlive PO three times daily - Magic cup three times daily with meals  - MVI w/ minerals daily   # Chronic Bradycardia Patient remains otherwise HDS and heart rate rapidly increases with movement.   - No further workup indicated.   Prior to Admission Living Arrangement: Home Anticipated Discharge Location: SNF vs. Home  Barriers to  Discharge: Placement, Ability to tolerate PO without IV   Dr. Glenford Bayley Internal Medicine PGY-1  Pager: [phone] After 5pm on weekdays and 1pm on weekends: On Call pager [phone]  07/09/2020, 3:32 PM

## 2020-07-09 NOTE — TOC Progression Note (Signed)
Transition of Care [facility]) - Progression Note    Patient Details  Name: Andres [name] MRN: [id number] Date of Birth: 11-08-1980  [facility] CM/SW Contact  [name], RN Phone Number: 07/09/2020, 4:39 PM  Clinical Narrative:    [name] and AFL director asking for PT/OT to re-eval. MD at bedside and in agreement. Orders in epic. Pt is keeping his legs bent up and this is not normal per [name].  TOC following.   Expected Discharge Plan: Skilled Nursing Facility Barriers to Discharge: Continued Medical Work up  Expected Discharge Plan and Services Expected Discharge Plan: Skilled Nursing Facility   Discharge Planning Services: CM Consult Post Acute Care Choice: Skilled Nursing Facility,Home Health Living arrangements for the past 2 months: Single Family Home                                       Social Determinants of Health (SDOH) Interventions    Readmission Risk Interventions No flowsheet data found.

## 2020-07-09 NOTE — Progress Notes (Signed)
RN messaged doctor to remove IV, not using medically and plan is for discharge to SNF in next 24 -48 hours. Contributing factor for restraint is to protect patient from pulling it out. MD in agreement and RN will remove IV and remove restraints. Patient no longer interfering with medical treatment. RN will continue to monitor.

## 2020-07-09 NOTE — [place] FL2 (Signed)
[place] MEDICAID FL2 LEVEL OF CARE SCREENING TOOL     IDENTIFICATION  Patient Name: Andres [name] Birthdate: [birth year] Sex: male Admission Date (Current Location): 07/01/2020  [facility] and [place] Number:  Producer, television/film/video and Address:  [facility] [place], [address], [place], [place] [address]      Provider Number: [id number]  Attending Physician Name and Address:  Reymundo Poll, MD  Relative Name and Phone Number:       Current Level of Care: Hospital Recommended Level of Care: Skilled Nursing Facility Prior Approval Number:    Date Approved/Denied:   PASRR Number:    Discharge Plan: SNF    Current Diagnoses: Patient Active Problem List   Diagnosis Date Noted  . Ileus (HCC)   . Acute post-hemorrhagic anemia   . Gastric AVM   . Gastroesophageal reflux disease with esophagitis without hemorrhage   . Protein-calorie malnutrition, severe 07/05/2020  . Small bowel obstruction (HCC)   . Ogilvie's syndrome   . Diarrhea   . Pressure injury of skin 07/03/2020  . Altered mental status   . Transaminitis   . Fall 07/02/2020  . BRBPR (bright red blood per rectum) 10/28/2019  . Hemorrhoids 10/28/2019    Orientation RESPIRATION BLADDER Height & Weight     Self  Normal Incontinent Weight: 46.8 kg Height:  5\' 6"  (167.6 cm)  BEHAVIORAL SYMPTOMS/MOOD NEUROLOGICAL BOWEL NUTRITION STATUS      Incontinent Diet (Regular with thin liquids)  AMBULATORY STATUS COMMUNICATION OF NEEDS Skin   Extensive Assist Non-Verbally  (stage 2 to lt lateral back and rt hip with foam dressings/ cracking to sacrum with foam dressing)                       Personal Care Assistance Level of Assistance  Bathing,Feeding,Dressing Bathing Assistance: Maximum assistance Feeding assistance: Maximum assistance Dressing Assistance: Maximum assistance     Functional Limitations Info  Sight,Hearing,Speech Sight Info: Adequate Hearing Info: Adequate Speech Info:  Impaired (non verbal except for small yes and no answers)    SPECIAL CARE FACTORS FREQUENCY  PT (By licensed PT),OT (By licensed OT),Speech therapy     PT Frequency: 5x/wk OT Frequency: 5x/wk     Speech Therapy Frequency: 5x/wk      Contractures Contractures Info: Present (feet and potentially legs--TBD)    Additional Factors Info  Code Status,Allergies,Psychotropic Code Status Info: Full Allergies Info: Lactose Psychotropic Info: Abilify 10 mg daily/ trazadone 100 mg at bedtime         Current Medications (07/09/2020):  This is the current hospital active medication list Current Facility-Administered Medications  Medication Dose Route Frequency Provider Last Rate Last Admin  . 0.9 %  sodium chloride infusion (Manually program via Guardrails IV Fluids)   Intravenous Once Seawell, Jaimie A, DO      . ARIPiprazole (ABILIFY) tablet 10 mg  10 mg Oral Daily 07/11/2020, MD   10 mg at 07/09/20 1014  . feeding supplement (ENSURE ENLIVE / ENSURE PLUS) liquid 237 mL  237 mL Oral TID BM 07/11/20, MD   237 mL at 07/09/20 1427  . multivitamin with minerals tablet 1 tablet  1 tablet Oral Daily 07/11/20, MD   1 tablet at 07/09/20 1014  . pantoprazole (PROTONIX) EC tablet 40 mg  40 mg Oral BID 07/11/20, MD   40 mg at 07/09/20 1014  . traZODone (DESYREL) tablet 100 mg  100 mg Oral QPM 07/11/20, MD  100 mg at 07/08/20 1841     Discharge Medications: Please see discharge summary for a list of discharge medications.  Relevant Imaging Results:  Relevant Lab Results:   Additional Information SS#: [id number]  [name], RN

## 2020-07-09 NOTE — Plan of Care (Signed)
  Problem: Education: Goal: Knowledge of General Education information will improve Description: Including pain rating scale, medication(s)/side effects and non-pharmacologic comfort measures 07/09/2020 1858 by Lorene Dy, RN Outcome: Not Progressing

## 2020-07-09 NOTE — TOC Progression Note (Signed)
Transition of Care [facility]) - Progression Note    Patient Details  Name: Tereso Unangst MRN: [id number] Date of Birth: 28-Mar-1981  Transition of Care [facility]) CM/SW Contact  [name], RN Phone Number: 07/09/2020, 11:31 AM  Clinical Narrative:    CM spoke to patients guardian Museum/gallery exhibitions officer) through [employer]: Irene Pap: [phone]. She is in agreement with having [name] come and assess the patient today to see if she can manage his care at home. If she is not able to manage then she is in agreement with SNF. CM spoke to [facility] and she will come and see the patient after work (about 4 pm).  TOC following.   Expected Discharge Plan: Skilled Nursing Facility Barriers to Discharge: Continued Medical Work up  Expected Discharge Plan and Services Expected Discharge Plan: Skilled Nursing Facility   Discharge Planning Services: CM Consult Post Acute Care Choice: Skilled Nursing Facility,Home Health Living arrangements for the past 2 months: Single Family Home                                       Social Determinants of Health (SDOH) Interventions    Readmission Risk Interventions No flowsheet data found.

## 2020-07-09 NOTE — Plan of Care (Signed)
  Problem: Education: Goal: Knowledge of General Education information will improve Description: Including pain rating scale, medication(s)/side effects and non-pharmacologic comfort measures Outcome: Not Progressing   Problem: Health Behavior/Discharge Planning: Goal: Ability to manage health-related needs will improve Outcome: Not Progressing   Problem: Activity: Goal: Risk for activity intolerance will decrease Outcome: Not Progressing   Problem: Skin Integrity: Goal: Risk for impaired skin integrity will decrease Outcome: Not Progressing   

## 2020-07-10 DIAGNOSIS — T7401XA Adult neglect or abandonment, confirmed, initial encounter: Secondary | ICD-10-CM

## 2020-07-10 DIAGNOSIS — E43 Unspecified severe protein-calorie malnutrition: Secondary | ICD-10-CM | POA: Diagnosis not present

## 2020-07-10 DIAGNOSIS — K5981 Ogilvie syndrome: Secondary | ICD-10-CM | POA: Diagnosis not present

## 2020-07-10 LAB — COMPREHENSIVE METABOLIC PANEL
ALT: 55 U/L — ABNORMAL HIGH (ref 0–44)
AST: 38 U/L (ref 15–41)
Albumin: 1.6 g/dL — ABNORMAL LOW (ref 3.5–5.0)
Alkaline Phosphatase: 74 U/L (ref 38–126)
Anion gap: 9 (ref 5–15)
BUN: 22 mg/dL — ABNORMAL HIGH (ref 6–20)
CO2: 28 mmol/L (ref 22–32)
Calcium: 8.5 mg/dL — ABNORMAL LOW (ref 8.9–10.3)
Chloride: 103 mmol/L (ref 98–111)
Creatinine, Ser: 0.73 mg/dL (ref 0.61–1.24)
GFR, Estimated: >60 mL/min (ref >60)
Glucose, Bld: 132 mg/dL — ABNORMAL HIGH (ref 70–99)
Potassium: 3.1 mmol/L — ABNORMAL LOW (ref 3.5–5.1)
Sodium: 140 mmol/L (ref 135–145)
Total Bilirubin: 0.9 mg/dL (ref 0.3–1.2)
Total Protein: 4.5 g/dL — ABNORMAL LOW (ref 6.5–8.1)

## 2020-07-10 LAB — GLUCOSE, CAPILLARY
Glucose-Capillary: 124 mg/dL — ABNORMAL HIGH (ref 70–99)
Glucose-Capillary: 113 mg/dL — ABNORMAL HIGH (ref 70–99)
Glucose-Capillary: 171 mg/dL — ABNORMAL HIGH (ref 70–99)
Glucose-Capillary: 121 mg/dL — ABNORMAL HIGH (ref 70–99)
Glucose-Capillary: 135 mg/dL — ABNORMAL HIGH (ref 70–99)
Glucose-Capillary: 232 mg/dL — ABNORMAL HIGH (ref 70–99)

## 2020-07-10 LAB — CBC WITH DIFFERENTIAL/PLATELET
Abs Immature Granulocytes: 0.11 10*3/uL — ABNORMAL HIGH (ref 0.00–0.07)
Basophils Absolute: 0 10*3/uL (ref 0.0–0.1)
Basophils Relative: 0 %
Eosinophils Absolute: 0 10*3/uL (ref 0.0–0.5)
Eosinophils Relative: 0 %
HCT: 28.3 % — ABNORMAL LOW (ref 39.0–52.0)
Hemoglobin: 9.8 g/dL — ABNORMAL LOW (ref 13.0–17.0)
Immature Granulocytes: 1 %
Lymphocytes Relative: 9 %
Lymphs Abs: 0.8 10*3/uL (ref 0.7–4.0)
MCH: 31.8 pg (ref 26.0–34.0)
MCHC: 34.6 g/dL (ref 30.0–36.0)
MCV: 91.9 fL (ref 80.0–100.0)
Monocytes Absolute: 0.7 10*3/uL (ref 0.1–1.0)
Monocytes Relative: 9 %
Neutro Abs: 6.7 10*3/uL (ref 1.7–7.7)
Neutrophils Relative %: 81 %
Platelets: 113 10*3/uL — ABNORMAL LOW (ref 150–400)
RBC: 3.08 MIL/uL — ABNORMAL LOW (ref 4.22–5.81)
RDW: 14.9 % (ref 11.5–15.5)
WBC: 8.3 10*3/uL (ref 4.0–10.5)
nRBC: 0 % (ref 0.0–0.2)

## 2020-07-10 LAB — MAGNESIUM: Magnesium: 1.6 mg/dL — ABNORMAL LOW (ref 1.7–2.4)

## 2020-07-10 LAB — IGG: IgG (Immunoglobin G), Serum: 873 mg/dL (ref 603–1613)

## 2020-07-10 LAB — ANTI-SMOOTH MUSCLE ANTIBODY, IGG: F-Actin IgG: 6 Units (ref 0–19)

## 2020-07-10 LAB — ANA W/REFLEX IF POSITIVE: Anti Nuclear Antibody (ANA): NEGATIVE

## 2020-07-10 LAB — PROTIME-INR
INR: 1.2 (ref 0.8–1.2)
Prothrombin Time: 14.5 seconds (ref 11.4–15.2)

## 2020-07-10 LAB — MITOCHONDRIAL ANTIBODIES: Mitochondrial M2 Ab, IgG: <20 Units (ref 0.0–20.0)

## 2020-07-10 LAB — PHOSPHORUS: Phosphorus: 1.1 mg/dL — ABNORMAL LOW (ref 2.5–4.6)

## 2020-07-10 LAB — ANTI-MICROSOMAL ANTIBODY LIVER / KIDNEY: LKM1 Ab: 1 Units (ref 0.0–20.0)

## 2020-07-10 LAB — CK: Total CK: 367 U/L (ref 49–397)

## 2020-07-10 MED ORDER — SODIUM CHLORIDE 0.9% FLUSH
10.0000 mL | Freq: Two times a day (BID) | INTRAVENOUS | Status: DC
  Administered 2020-07-10 – 2020-07-24 (×27): 10 mL
  Administered 2020-07-24: 22:00:00 20 mL
  Administered 2020-07-25 – 2020-07-26 (×2): 10 mL
  Administered 2020-07-26: 20 mL
  Administered 2020-07-27 – 2020-08-07 (×22): 10 mL
  Administered 2020-08-08: 20 mL
  Administered 2020-08-08 – 2020-08-09 (×3): 10 mL
  Administered 2020-08-10: 30 mL
  Administered 2020-08-10: 10 mL
  Administered 2020-08-11: 20 mL
  Administered 2020-08-11 – 2020-08-12 (×2): 10 mL
  Administered 2020-08-12: 20 mL
  Administered 2020-08-13 – 2020-08-15 (×4): 10 mL
  Administered 2020-08-15: 20 mL
  Administered 2020-08-16 – 2020-08-17 (×3): 10 mL
  Administered 2020-08-17: 20 mL
  Administered 2020-08-18 – 2020-08-20 (×4): 10 mL

## 2020-07-10 MED ORDER — THIAMINE HCL 100 MG/ML IJ SOLN
100.0000 mg | Freq: Two times a day (BID) | INTRAMUSCULAR | Status: AC
  Administered 2020-07-10 – 2020-07-16 (×13): 100 mg via INTRAVENOUS
  Filled 2020-07-10 (×13): qty 2

## 2020-07-10 MED ORDER — POTASSIUM CHLORIDE CRYS ER 20 MEQ PO TBCR
40.0000 meq | EXTENDED_RELEASE_TABLET | Freq: Four times a day (QID) | ORAL | Status: DC
  Administered 2020-07-10 (×2): 40 meq via ORAL
  Filled 2020-07-10 (×2): qty 2

## 2020-07-10 MED ORDER — POTASSIUM PHOSPHATES 15 MMOLE/5ML IV SOLN
40.0000 mmol | Freq: Once | INTRAVENOUS | Status: AC
  Administered 2020-07-10: 22:00:00 40 mmol via INTRAVENOUS
  Filled 2020-07-10: qty 13.33

## 2020-07-10 MED ORDER — SODIUM CHLORIDE 0.9% FLUSH: 10.0000 mL | INTRAVENOUS | Status: DC | PRN

## 2020-07-10 MED ORDER — POTASSIUM PHOSPHATES 15 MMOLE/5ML IV SOLN: 40.0000 mmol | Freq: Once | INTRAVENOUS | Status: DC

## 2020-07-10 MED ORDER — MAGNESIUM SULFATE 4 GM/100ML IV SOLN
4.0000 g | Freq: Once | INTRAVENOUS | Status: AC
  Administered 2020-07-10: 16:00:00 4 g via INTRAVENOUS
  Filled 2020-07-10: qty 100

## 2020-07-10 NOTE — Progress Notes (Addendum)
Re: Andres [name] DOB: [birth year] Date: 07/10/2020   To Whom It May Concern:  Please be advised that the above-named patient will require a short-term nursing home stay--anticipated 30 days or less for rehabilitation and strengthening. The plan is for home.  Glenford Bayley, MD 07/10/2020, 1:15 PM Pager: [phone]

## 2020-07-10 NOTE — Plan of Care (Signed)
Patient is unable to progress towards goals at this time due to altered mental status and disorientation x3.  Problem: Education: Goal: Knowledge of General Education information will improve Description: Including pain rating scale, medication(s)/side effects and non-pharmacologic comfort measures Outcome: Not Progressing   Problem: Health Behavior/Discharge Planning: Goal: Ability to manage health-related needs will improve Outcome: Not Progressing   Problem: Clinical Measurements: Goal: Ability to maintain clinical measurements within normal limits will improve Outcome: Not Progressing Goal: Will remain free from infection Outcome: Not Progressing Goal: Diagnostic test results will improve Outcome: Not Progressing Goal: Respiratory complications will improve Outcome: Not Progressing Goal: Cardiovascular complication will be avoided Outcome: Not Progressing   Problem: Activity: Goal: Risk for activity intolerance will decrease Outcome: Not Progressing   Problem: Nutrition: Goal: Adequate nutrition will be maintained Outcome: Not Progressing   Problem: Coping: Goal: Level of anxiety will decrease Outcome: Not Progressing   Problem: Elimination: Goal: Will not experience complications related to bowel motility Outcome: Not Progressing Goal: Will not experience complications related to urinary retention Outcome: Not Progressing

## 2020-07-10 NOTE — Progress Notes (Signed)
Subjective:   No acute events reported overnight. IV team were attempting to start a new peripheral line for magnesium replacement this morning. Patient said he was doing "okay" and felt "fine". Otherwise did not communicate as much with [place] this morning.   Objective:  Vital signs in last 24 hours: Vitals:   07/09/20 2333 07/10/20 0327 07/10/20 0940 07/10/20 1138  BP: 129/77 122/71 116/81 114/75  Pulse: 93 87 95 96  Resp: 18 18 18 18   Temp: 99.5 F (37.5 C) 99 F (37.2 C) 98.1 F (36.7 C) 98 F (36.7 C)  TempSrc: Axillary Axillary Oral   SpO2: 99% 99% 98%   Weight:      Height:       General: Patient is resting in no acute distress.  Neurological: Patient is alert and able to track with eyes and speak. Musculoskeletal: Swelling in RUE improved since yesterday. 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  Skin: No jaundice. No purpura.   Assessment/Plan:  Active Problems:   Fall   Pressure injury of skin   Altered mental status   Transaminitis   Diarrhea   Small bowel obstruction (HCC)   Ogilvie's syndrome   Protein-calorie malnutrition, severe   Acute post-hemorrhagic anemia   Gastric AVM   Gastroesophageal reflux disease with esophagitis without hemorrhage   Ileus (HCC)  # Severe Malnutrition Patient is severely cachectic with bilateral lower extremity contractures. Albumin down to 1.6 today. Caregiver noted that patient tends to experience vomiting when fed too much, although patient noted to eat well for nursing staff without vomiting in the hospital, repeatedly asking for different types of food and drink. Concern patient may not be receiving adequate nutrition outside the hospital.   - Will check weight q4 days - Dietician consulted for possible refeeding sydnrome - For now, continue Ensure, Magic cup, MVI and regular diet  - Continue working with PT/OT: Recommendation for SNF  # Possible Refeeding Syndrome  Patient presented with  hypokalemia, although in the setting of diarrhea, vomiting and bowel incontinence. Found to have severe hypophosphatemia that recurred despite replacement, as well as hypomagnesemia. Concern for possible refeeding syndrome given malnourished state s/p NPO, although difficult to distinguish from malabsorption in the setting of Olgilvie's syndrome.   - Continue K+ replacement > 4.0; will likely need scheduled replacements  - Getting IV for magnesium replacement  - Check phosphorous  - Started IV thiamine 100mg  BID (Day 1/7) - avoiding oral due to lactose intolerance  - Checking CK to r/o worsening rhabdomyolysis  - Dietician services consulted  # Liver Injury  AST and ALT improving. INR now within normal limits, although platelet count decreased to 113. New since 2019. RUQ, viral hepatitis markers unremarkable, CT abdomen with mild perihepatic ascites. May be secondary to refeeding syndrome.  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 Diet advanced to regular diet and continues to request food without new nausea/diarrhea.   - GI consulted and have signed off.  - Continue regular diet - Outpatient CTA for abnormal abdominal finding   # GERD w/ Severe Erosive Esophagitis Patient found to have Grade D Reflux w/ esophagitis on EGD. Patient tolerating diet well without pain.  - Protonix 40mg  twice daily, long-term  - Head of bed > 30 degrees at all times   # Upper GI Bleed 2/2 Angiodysplasia, Resolved # Acute Normocytic Anemia, Stable  - Continue to monitor  CBC and transfuse if Hgb < 7.0  Prior to Admission Living Arrangement: Home Anticipated Discharge Location: SNF  Barriers to Discharge: Placement, Electrolyte stability, Severe Malnutrition   Dr. Glenford Bayley Internal Medicine PGY-1  Pager: [phone] After 5pm on weekdays and 1pm on  weekends: On Call pager [phone]  07/10/2020, 3:07 PM

## 2020-07-10 NOTE — Evaluation (Signed)
Occupational Therapy Re-Evaluation & Discharge Patient Details Name: Andres [name] MRN: [id number] DOB: 17-May-1981 Today's Date: 07/10/2020    History of Present Illness 39 y.o. male presenting s/p unwhitnessed fall. CXR (-), XR pelvis (-), and CT head/spine (-) for acute findings. PMHx significant for fetal alcohol symdrome with cognitive impairment, Autism, non-verbal, and behaviors.    Clinical Impression   Patient met in R side-lying with bilateral UE/LE and trunk flexed in fetal position. Flexor tone present in all 4 extremities including cervical neck, hands and feet. Patient continues to resist passive movement to neutral demonstrating shouting/grimacing with initiation of movement particularly in BLE. Per clinical judgement and functional assessment, contractures noted have likely been present for an extensive amount of time prior to admission. Patient able to open L hand and fully extend digits but unable to extend PIP joints of R hand. Patient continues demonstrate need for Mod to Max A and multimodal cues for initiation and sequencing bed mobility, Max A +2 for sit to stand transfers, inability to ambulate with +2 assist, and ability to follow 1-step verbal commands with <15% accuracy. Patient is incontinent x2 at baseline and required Total A for hygiene/clothing management at bed level this date. Given patient CLOF of total care, recommendation for SNF placement if caregiver is unable to provide needed level of assistance for safe d/c home.     Follow Up Recommendations  SNF    Equipment Recommendations  Other (comment) (Defer to next level of care)    Recommendations for Other Services       Precautions / Restrictions Precautions Precautions: Fall Precaution Comments: High Fall Risk Restrictions Weight Bearing Restrictions: No      Mobility Bed Mobility Overal bed mobility: Needs Assistance Bed Mobility: Rolling;Sidelying to Sit Rolling: Mod assist Sidelying to sit:  Max assist;+2 for physical assistance;+2 for safety/equipment       General bed mobility comments: Mod A for rolling to L. Patient rolls with BLE flexed at knees and hips in fetal position. Max A +2 for sidelying to sit at trunk and BLE.     Transfers Overall transfer level: Needs assistance Equipment used: 2 person hand held assist Transfers: Sit to/from Stand Sit to Stand: Max assist;+2 physical assistance;+2 safety/equipment         General transfer comment: Deferred for patient/therapist safety.    Balance Overall balance assessment: Needs assistance Sitting-balance support: No upper extremity supported;Feet supported Sitting balance-Leahy Scale: Poor   Postural control: Posterior lean Standing balance support: Bilateral upper extremity supported Standing balance-Leahy Scale: Poor Standing balance comment: HHA +2 to maintain static standing balance. Max verbal and tactile cues and Max A +2 to shift weight bilaterally to take steps forward.                            ADL either performed or assessed with clinical judgement   ADL Overall ADL's : Needs assistance/impaired     Grooming: Total assistance;Bed level   Upper Body Bathing: Total assistance;Bed level   Lower Body Bathing: Total assistance;Bed level   Upper Body Dressing : Total assistance;Bed level Upper Body Dressing Details (indicate cue type and reason): Patient declined donning gown. Prefers to be nude.  Lower Body Dressing: Total assistance;Bed level Lower Body Dressing Details (indicate cue type and reason): Total A to don footwear. Patient unable to extend BLE 2/2 flexor tone.    Toilet Transfer Details (indicate cue type and reason): Patient wears incontinence briefs at  baseline.  Toileting- Clothing Manipulation and Hygiene: Total assistance       Functional mobility during ADLs: Total assistance;+2 for physical assistance;+2 for safety/equipment       Vision         Perception      Praxis      Pertinent Vitals/Pain Pain Assessment: Faces Faces Pain Scale: Hurts a little bit Pain Location: General discomfort with movement. Patient also resistant to passive movement.  Pain Intervention(s): Monitored during session     Hand Dominance     Extremity/Trunk Assessment Upper Extremity Assessment RUE Deficits / Details: Flexor tone throughout RUE Sensation:  (Difficult to assess) RUE Coordination: decreased fine motor;decreased gross motor LUE Deficits / Details: Flexor tone throughout  LUE Sensation:  (Unable to assess) LUE Coordination: decreased fine motor;decreased gross motor   Lower Extremity Assessment Lower Extremity Assessment: Defer to PT evaluation       Communication     Cognition Arousal/Alertness: Awake/alert Behavior During Therapy: Agitated Overall Cognitive Status: No family/caregiver present to determine baseline cognitive functioning                                 General Comments: Patient with garbled, unintelligible speech presumed baseline. Patient able to follow 1-step verbal commands with 15% accuracy with cues for initiation.    General Comments       Exercises     Shoulder Instructions      Home Living                                          Prior Functioning/Environment                   OT Problem List:        OT Treatment/Interventions:      OT Goals(Current goals can be found in the care plan section) Acute Rehab OT Goals Patient Stated Goal: Unable to state.  OT Goal Formulation: Patient unable to participate in goal setting Time For Goal Achievement: 07/17/20 Potential to Achieve Goals: Poor  OT Frequency: Min 2X/week   Barriers to D/C:            Co-evaluation PT/OT/SLP Co-Evaluation/Treatment: Yes            AM-PAC OT "6 Clicks" Daily Activity     Outcome Measure Help from another person eating meals?: A Lot Help from another person taking care of  personal grooming?: Total Help from another person toileting, which includes using toliet, bedpan, or urinal?: Total Help from another person bathing (including washing, rinsing, drying)?: Total Help from another person to put on and taking off regular upper body clothing?: Total Help from another person to put on and taking off regular lower body clothing?: Total 6 Click Score: 7   End of Session Nurse Communication:  (Maxi move)  Activity Tolerance: Treatment limited secondary to agitation Patient left: in bed;with nursing/sitter in room  OT Visit Diagnosis: Unsteadiness on feet (R26.81);Muscle weakness (generalized) (M62.81)                Time: 7124-5809 OT Time Calculation (min): 28 min Charges:  OT General Charges $OT Visit: 1 Visit OT Evaluation $OT Re-eval: 1 Re-eval OT Treatments $Self Care/Home Management : 8-22 mins  Scout Guyett H. OTR/L Supplemental OT, Department of rehab services [phone]  [facility] R  H. 07/10/2020, 1:08 PM

## 2020-07-10 NOTE — Progress Notes (Signed)
   07/10/20 2021  Assess: MEWS Score  Temp 97.6 F (36.4 C)  BP 118/81  Pulse Rate (!) 118  Resp 18  Level of Consciousness Alert  SpO2 100 %  O2 Device Room Air  Patient Activity (if Appropriate) In bed  Assess: MEWS Score  MEWS Temp 0  MEWS Systolic 0  MEWS Pulse 2  MEWS RR 0  MEWS LOC 0  MEWS Score 2  MEWS Score Color Yellow  Assess: if the MEWS score is Yellow or Red  Were vital signs taken at a resting state? Yes  Focused Assessment No change from prior assessment  Early Detection of Sepsis Score *See Row Information* Low  MEWS guidelines implemented *See Row Information* Yes  Treat  MEWS Interventions Other (Comment) (consulted with charge)  Pain Scale 0-10  Pain Score 0  Faces Pain Scale 0  Take Vital Signs  Increase Vital Sign Frequency  Yellow: Q 2hr X 2 then Q 4hr X 2, if remains yellow, continue Q 4hrs  Escalate  MEWS: Escalate Yellow: discuss with charge nurse/RN and consider discussing with provider and RRT  Notify: Charge Nurse/RN  Name of Charge Nurse/RN Notified Darral Dash R  Date Charge Nurse/RN Notified 07/10/20  Time Charge Nurse/RN Notified 2031  Document  Patient Outcome Other (Comment) (stable continue to monitor at this time)  Progress note created (see row info) Yes

## 2020-07-10 NOTE — TOC Progression Note (Signed)
Transition of Care [facility]) - Progression Note    Patient Details  Name: Andres [name] MRN: [id number] Date of Birth: 1980-10-14  [facility] CM/SW Contact  [name], RN Phone Number: 07/10/2020, 4:17 PM  Clinical Narrative:    CM spoke to the patients guardian: Andres [name] and provided her the information from PT/OT and she was agreeable to SNF rehab.  CM provided her the bed offers and she is asking to think about it and will update CM in the am.  Information for [facility] sent to [place] [facility].  TOC following.   Expected Discharge Plan: Skilled Nursing Facility Barriers to Discharge: Continued Medical Work up  Expected Discharge Plan and Services Expected Discharge Plan: Skilled Nursing Facility   Discharge Planning Services: CM Consult Post Acute Care Choice: Skilled Nursing Facility,Home Health Living arrangements for the past 2 months: Single Family Home                                       Social Determinants of Health (SDOH) Interventions    Readmission Risk Interventions No flowsheet data found.

## 2020-07-10 NOTE — Progress Notes (Signed)
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 He presented to the ED following an unwitnessed fall in the shower and change in his baseline following in that he was slumped over, drooling, and not as responsive. During the day M-F he is at [facility], per chart review. CT negative for cervical fx or acute intracranial abnormality. Further PMH: seizures, GERD, and depression.  Clinical Impression  Patient met in R side-lying with bilateral UE/LE and trunk flexed in fetal position. Flexor tone present in all 4 extremities including cervical neck, hands and feet. Patient continues to resist passive movement to neutral demonstrating shouting/grimacing with initiation of movement particularly in bilat legs. Per clinical judgement and functional assessment, contractures noted have likely been present for an extensive amount of time prior to admission. He also had pressure ulcers upon admission to the hospital consistent with the position he tends to favor currently. While there is contradicting info from his caregiver stating he did not lay in bed and was independent for mobility without AD/AE, per clinical judgement and findings he currently appears to be at his baseline level of function. Communicated with case manager in regards to findings and reports. As pt continues to be resistant to functional mob and requires extensive assistance for all, recommending d/c to SNF placement if caregiver is unable to provide needed level of assistance for safe d/c home.     Follow Up Recommendations SNF;Supervision/Assistance - 24 hour    Equipment Recommendations  Other (comment) (unaware of available equipment at home)     Recommendations for Other Services       Precautions / Restrictions Precautions Precautions: Fall Precaution Comments: High Fall Risk; seizures Restrictions Weight Bearing Restrictions: No      Mobility  Bed Mobility Overal bed mobility: Needs Assistance Bed Mobility: Rolling Rolling: Mod assist Sidelying to sit: Max assist;+2 for physical assistance;+2 for safety/equipment       General bed mobility comments: Mod A for rolling to L. Patient rolls with BLE flexed at knees and hips in fetal position.    Transfers Overall transfer level: Needs assistance Equipment used: 2 person hand held assist Transfers: Sit to/from Stand Sit to Stand: Max assist;+2 physical assistance;+2 safety/equipment         General transfer comment: Pt declined attempt.  Ambulation/Gait             General Gait Details: Pt declined attempt.  Stairs            Wheelchair Mobility    Modified Rankin (Stroke Patients Only) Modified Rankin (Stroke Patients Only) Pre-Morbid Rankin Score: Moderately severe disability Modified Rankin: Severe disability     Balance Overall balance assessment: Needs assistance Sitting-balance support: No upper extremity supported;Feet supported Sitting balance-Leahy Scale: Poor Sitting balance - Comments: Pt declined attempt. Postural control: Posterior lean Standing balance support: Bilateral upper extremity supported Standing balance-Leahy Scale: Poor Standing balance comment: Pt declined attempt.                             Pertinent Vitals/Pain Pain Assessment: Faces Faces Pain Scale: Hurts even more Pain Location: PROM to bilat legs into extension or hip abduction Pain Descriptors / Indicators: Grimacing;Guarding Pain Intervention(s):  Limited activity within patient's tolerance;Monitored during session;Repositioned    Home Living Family/patient expects to be discharged to:: Other (Comment) (alternative family living with pt  at day program called [employer]) Living Arrangements: Other (Comment) (Caregivers with alternative family living) Available Help at Discharge: Available 24 hours/day;Personal care attendant                  Prior Function Level of Independence: Needs assistance         Comments: Contradicting information in med chart where it states he was not up often except to be changed and then that he was walking often without assistance or AD/AE. Contacted caregiver, who stated prior to admission pt was walking fully upright and not slumped and was walking without AD/AE or assistance. She stated the pt was up everyday and does not lay in bed. Per supervisor via phone, an assessment of capabilities was completed by his caregivers/guardians in September stating he required assistance for all ADLs and could not be left alone due to inability to work phone. However, supervisor states she has not physically seen this pt in a while and is unaware of his physical capabilities.     Hand Dominance   Dominant Hand:  (Unknown)    Extremity/Trunk Assessment   Upper Extremity Assessment Upper Extremity Assessment: Defer to OT evaluation RUE Deficits / Details: Flexor tone throughout RUE Sensation:  (Difficult to assess) RUE Coordination: decreased fine motor;decreased gross motor LUE Deficits / Details: Flexor tone throughout  LUE Sensation:  (Unable to assess) LUE Coordination: decreased fine motor;decreased gross motor    Lower Extremity Assessment Lower Extremity Assessment: RLE deficits/detail;LLE deficits/detail RLE Deficits / Details: Flexor tone throughout with pt holding self in fetal position rolled to R with L hip and knee resting in more flexion than R, toes flexed also RLE Sensation:  (unable to formally assess) RLE Coordination: decreased fine motor;decreased gross motor (unable to formally assess) LLE Deficits / Details: Flexor tone throughout with pt holding self in fetal  position rolled to R with L hip and knee resting in more flexion than R, toes flexed also LLE Sensation:  (unable to formally assess) LLE Coordination: decreased fine motor;decreased gross motor (unable to formally assess)    Cervical / Trunk Assessment Cervical / Trunk Assessment: Kyphotic (Forward head)  Communication   Communication: Other (comment) (Patient non-verbal at baseline, answers some yes/no questions)  Cognition Arousal/Alertness: Awake/alert Behavior During Therapy: Flat affect Overall Cognitive Status: No family/caregiver present to determine baseline cognitive functioning                                 General Comments: Pt only speaking 1x stating "no" to if we could attempt to stand otherwise wrinkled his nose and remained quiet when asked "were you walking before you came here?"      General Comments General comments (skin integrity, edema, etc.): Decreased skin integrity with multiple wounds on posterior trunk and sacrum.    Exercises Other Exercises Other Exercises: PROM bilat knee extension with hip extension supine in bed 2x ~30-60 seconds each Other Exercises: PROM bilat hip abduction 2x ~20-30 sec each   Assessment/Plan    PT Assessment All further PT needs can be met in the next venue of care;Patent does not need any further PT services  PT Problem List Decreased strength;Decreased activity tolerance;Decreased balance;Decreased mobility;Decreased coordination;Decreased cognition;Decreased knowledge of use of DME;Decreased safety awareness;Decreased knowledge of precautions;Decreased skin  integrity;Pain       PT Treatment Interventions      PT Goals (Current goals can be found in the Care Plan section)  Acute Rehab PT Goals Patient Stated Goal: Unable to state.  PT Goal Formulation: Patient unable to participate in goal setting Time For Goal Achievement: 07/17/20 Potential to Achieve Goals: Fair    Frequency     Barriers to discharge         Co-evaluation               AM-PAC PT "6 Clicks" Mobility  Outcome Measure Help needed turning from your back to your side while in a flat bed without using bedrails?: A Lot Help needed moving from lying on your back to sitting on the side of a flat bed without using bedrails?: A Lot Help needed moving to and from a bed to a chair (including a wheelchair)?: A Lot Help needed standing up from a chair using your arms (e.g., wheelchair or bedside chair)?: A Lot Help needed to walk in hospital room?: Total Help needed climbing 3-5 steps with a railing? : Total 6 Click Score: 10    End of Session Equipment Utilized During Treatment: Gait belt Activity Tolerance: Other (comment) (treatment limited by desire to participate) Patient left: in bed;with nursing/sitter in room;with call bell/phone within reach Nurse Communication: Mobility status;Other (comment) (positioning with pillows) PT Visit Diagnosis: Unsteadiness on feet (R26.81);History of falling (Z91.81);Muscle weakness (generalized) (M62.81);Difficulty in walking, not elsewhere classified (R26.2);Pain Pain - Right/Left:  (bilat) Pain - part of body: Leg    Time: 6333-5456 PT Time Calculation (min) (ACUTE ONLY): 13 min   Charges:   PT Evaluation $PT Re-evaluation: 1 Re-eval          Moishe Spice, PT, DPT Acute Rehabilitation Services  Pager: [phone] Office: [phone]   Orvan Falconer 07/10/2020, 3:11 PM

## 2020-07-11 ENCOUNTER — Inpatient Hospital Stay (HOSPITAL_COMMUNITY): Payer: Medicare Other

## 2020-07-11 DIAGNOSIS — E878 Other disorders of electrolyte and fluid balance, not elsewhere classified: Secondary | ICD-10-CM | POA: Diagnosis not present

## 2020-07-11 DIAGNOSIS — E43 Unspecified severe protein-calorie malnutrition: Secondary | ICD-10-CM | POA: Diagnosis not present

## 2020-07-11 DIAGNOSIS — I361 Nonrheumatic tricuspid (valve) insufficiency: Secondary | ICD-10-CM

## 2020-07-11 DIAGNOSIS — I472 Ventricular tachycardia: Secondary | ICD-10-CM

## 2020-07-11 DIAGNOSIS — I340 Nonrheumatic mitral (valve) insufficiency: Secondary | ICD-10-CM

## 2020-07-11 LAB — CBC WITH DIFFERENTIAL/PLATELET
Abs Immature Granulocytes: 0.14 10*3/uL — ABNORMAL HIGH (ref 0.00–0.07)
Basophils Absolute: 0 10*3/uL (ref 0.0–0.1)
Basophils Relative: 0 %
Eosinophils Absolute: 0 10*3/uL (ref 0.0–0.5)
Eosinophils Relative: 0 %
HCT: 27.9 % — ABNORMAL LOW (ref 39.0–52.0)
Hemoglobin: 9.7 g/dL — ABNORMAL LOW (ref 13.0–17.0)
Immature Granulocytes: 2 %
Lymphocytes Relative: 12 %
Lymphs Abs: 0.9 10*3/uL (ref 0.7–4.0)
MCH: 32 pg (ref 26.0–34.0)
MCHC: 34.8 g/dL (ref 30.0–36.0)
MCV: 92.1 fL (ref 80.0–100.0)
Monocytes Absolute: 0.7 10*3/uL (ref 0.1–1.0)
Monocytes Relative: 10 %
Neutro Abs: 5.5 10*3/uL (ref 1.7–7.7)
Neutrophils Relative %: 76 %
Platelets: 120 10*3/uL — ABNORMAL LOW (ref 150–400)
RBC: 3.03 MIL/uL — ABNORMAL LOW (ref 4.22–5.81)
RDW: 14.6 % (ref 11.5–15.5)
WBC: 7.3 10*3/uL (ref 4.0–10.5)
nRBC: 0 % (ref 0.0–0.2)

## 2020-07-11 LAB — GLUCOSE, CAPILLARY
Glucose-Capillary: 104 mg/dL — ABNORMAL HIGH (ref 70–99)
Glucose-Capillary: 127 mg/dL — ABNORMAL HIGH (ref 70–99)
Glucose-Capillary: 135 mg/dL — ABNORMAL HIGH (ref 70–99)
Glucose-Capillary: 136 mg/dL — ABNORMAL HIGH (ref 70–99)

## 2020-07-11 LAB — COMPREHENSIVE METABOLIC PANEL
ALT: 53 U/L — ABNORMAL HIGH (ref 0–44)
AST: 45 U/L — ABNORMAL HIGH (ref 15–41)
Albumin: 1.6 g/dL — ABNORMAL LOW (ref 3.5–5.0)
Alkaline Phosphatase: 66 U/L (ref 38–126)
Anion gap: 8 (ref 5–15)
BUN: 15 mg/dL (ref 6–20)
CO2: 27 mmol/L (ref 22–32)
Calcium: 8 mg/dL — ABNORMAL LOW (ref 8.9–10.3)
Chloride: 106 mmol/L (ref 98–111)
Creatinine, Ser: 0.63 mg/dL (ref 0.61–1.24)
GFR, Estimated: >60 mL/min (ref >60)
Glucose, Bld: 148 mg/dL — ABNORMAL HIGH (ref 70–99)
Potassium: 4 mmol/L (ref 3.5–5.1)
Sodium: 141 mmol/L (ref 135–145)
Total Bilirubin: 0.8 mg/dL (ref 0.3–1.2)
Total Protein: 4.8 g/dL — ABNORMAL LOW (ref 6.5–8.1)

## 2020-07-11 LAB — CORTISOL: Cortisol, Plasma: 23.5 ug/dL

## 2020-07-11 LAB — ECHOCARDIOGRAM COMPLETE
Area-P 1/2: 3.46 cm2
Height: 66 in
S' Lateral: 2.6 cm
Weight: 1650.8 oz

## 2020-07-11 LAB — PHOSPHORUS
Phosphorus: 3 mg/dL (ref 2.5–4.6)
Phosphorus: 1.8 mg/dL — ABNORMAL LOW (ref 2.5–4.6)

## 2020-07-11 LAB — POTASSIUM: Potassium: 4.1 mmol/L (ref 3.5–5.1)

## 2020-07-11 LAB — MAGNESIUM
Magnesium: 1.9 mg/dL (ref 1.7–2.4)
Magnesium: 1.8 mg/dL (ref 1.7–2.4)

## 2020-07-11 MED ORDER — POTASSIUM CHLORIDE CRYS ER 20 MEQ PO TBCR
40.0000 meq | EXTENDED_RELEASE_TABLET | Freq: Once | ORAL | Status: AC
  Administered 2020-07-11: 10:00:00 40 meq via ORAL
  Filled 2020-07-11: qty 2

## 2020-07-11 MED ORDER — SODIUM CHLORIDE 0.9 % IV BOLUS
1000.0000 mL | Freq: Once | INTRAVENOUS | Status: AC
  Administered 2020-07-11: 15:00:00 1000 mL via INTRAVENOUS

## 2020-07-11 MED ORDER — POTASSIUM PHOSPHATES 15 MMOLE/5ML IV SOLN
40.0000 mmol | Freq: Once | INTRAVENOUS | Status: AC
  Administered 2020-07-12: 01:00:00 40 mmol via INTRAVENOUS
  Filled 2020-07-11: qty 13.33

## 2020-07-11 MED ORDER — MAGNESIUM SULFATE 2 GM/50ML IV SOLN
2.0000 g | Freq: Once | INTRAVENOUS | Status: AC
  Administered 2020-07-11: 10:00:00 2 g via INTRAVENOUS
  Filled 2020-07-11: qty 50

## 2020-07-11 MED ORDER — ENSURE ENLIVE PO LIQD
237.0000 mL | Freq: Three times a day (TID) | ORAL | Status: DC
  Administered 2020-07-11 – 2020-07-15 (×9): 237 mL via ORAL

## 2020-07-11 MED ORDER — MAGNESIUM SULFATE 2 GM/50ML IV SOLN
2.0000 g | Freq: Once | INTRAVENOUS | Status: AC
  Administered 2020-07-12: 2 g via INTRAVENOUS
  Filled 2020-07-11: qty 50
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
## 2020-07-11 NOTE — Plan of Care (Deleted)

## 2020-07-11 NOTE — Plan of Care (Signed)
Patient not progressing due to altered mental status.  Problem: Education: Goal: Knowledge of General Education information will improve Description: Including pain rating scale, medication(s)/side effects and non-pharmacologic comfort measures 07/11/2020 2016 by Merrilee Seashore, RN Outcome: Not Progressing 07/11/2020 2011 by Merrilee Seashore, RN Outcome: Progressing   Problem: Health Behavior/Discharge Planning: Goal: Ability to manage health-related needs will improve 07/11/2020 2016 by Merrilee Seashore, RN Outcome: Not Progressing 07/11/2020 2011 by Merrilee Seashore, RN Outcome: Progressing   Problem: Clinical Measurements: Goal: Ability to maintain clinical measurements within normal limits will improve 07/11/2020 2016 by Merrilee Seashore, RN Outcome: Not Progressing 07/11/2020 2011 by Merrilee Seashore, RN Outcome: Progressing Goal: Will remain free from infection 07/11/2020 2016 by Merrilee Seashore, RN Outcome: Not Progressing 07/11/2020 2011 by Merrilee Seashore, RN Outcome: Progressing Goal: Diagnostic test results will improve 07/11/2020 2016 by Merrilee Seashore, RN Outcome: Not Progressing 07/11/2020 2011 by Merrilee Seashore, RN Outcome: Progressing Goal: Respiratory complications will improve 07/11/2020 2016 by Merrilee Seashore, RN Outcome: Not Progressing 07/11/2020 2011 by Merrilee Seashore, RN Outcome: Progressing Goal: Cardiovascular complication will be avoided 07/11/2020 2016 by Merrilee Seashore, RN Outcome: Not Progressing 07/11/2020 2011 by Merrilee Seashore, RN Outcome: Progressing   Problem: Activity: Goal: Risk for activity intolerance will decrease 07/11/2020 2016 by Merrilee Seashore, RN Outcome: Not Progressing 07/11/2020 2011 by Merrilee Seashore, RN Outcome: Progressing   Problem: Nutrition: Goal: Adequate nutrition will be maintained 07/11/2020 2016 by Merrilee Seashore, RN Outcome: Not Progressing 07/11/2020 2011 by Merrilee Seashore,  RN Outcome: Progressing   Problem: Coping: Goal: Level of anxiety will decrease 07/11/2020 2016 by Merrilee Seashore, RN Outcome: Not Progressing 07/11/2020 2011 by Merrilee Seashore, RN Outcome: Progressing   Problem: Elimination: Goal: Will not experience complications related to bowel motility 07/11/2020 2016 by Merrilee Seashore, RN Outcome: Not Progressing 07/11/2020 2011 by Merrilee Seashore, RN Outcome: Progressing Goal: Will not experience complications related to urinary retention 07/11/2020 2016 by Merrilee Seashore, RN Outcome: Not Progressing 07/11/2020 2011 by Merrilee Seashore, RN Outcome: Progressing

## 2020-07-11 NOTE — Progress Notes (Signed)
  Echocardiogram 2D Echocardiogram has been performed.  Augustine Radar 07/11/2020, 11:22 AM
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
## 2020-07-12 ENCOUNTER — Inpatient Hospital Stay (HOSPITAL_COMMUNITY): Payer: Medicare Other

## 2020-07-12 DIAGNOSIS — E878 Other disorders of electrolyte and fluid balance, not elsewhere classified: Secondary | ICD-10-CM | POA: Diagnosis not present

## 2020-07-12 DIAGNOSIS — J189 Pneumonia, unspecified organism: Secondary | ICD-10-CM | POA: Diagnosis not present

## 2020-07-12 LAB — GLUCOSE, CAPILLARY
Glucose-Capillary: 123 mg/dL — ABNORMAL HIGH (ref 70–99)
Glucose-Capillary: 118 mg/dL — ABNORMAL HIGH (ref 70–99)
Glucose-Capillary: 93 mg/dL (ref 70–99)
Glucose-Capillary: 87 mg/dL (ref 70–99)
Glucose-Capillary: 165 mg/dL — ABNORMAL HIGH (ref 70–99)

## 2020-07-12 LAB — CBC
HCT: 24.3 % — ABNORMAL LOW (ref 39.0–52.0)
Hemoglobin: 8.3 g/dL — ABNORMAL LOW (ref 13.0–17.0)
MCH: 31.9 pg (ref 26.0–34.0)
MCHC: 34.2 g/dL (ref 30.0–36.0)
MCV: 93.5 fL (ref 80.0–100.0)
Platelets: 164 10*3/uL (ref 150–400)
RBC: 2.6 MIL/uL — ABNORMAL LOW (ref 4.22–5.81)
RDW: 14.5 % (ref 11.5–15.5)
WBC: 8.9 10*3/uL (ref 4.0–10.5)
nRBC: 0 % (ref 0.0–0.2)

## 2020-07-12 LAB — COMPREHENSIVE METABOLIC PANEL
ALT: 53 U/L — ABNORMAL HIGH (ref 0–44)
AST: 49 U/L — ABNORMAL HIGH (ref 15–41)
Albumin: 1.5 g/dL — ABNORMAL LOW (ref 3.5–5.0)
Alkaline Phosphatase: 60 U/L (ref 38–126)
Anion gap: 7 (ref 5–15)
BUN: 15 mg/dL (ref 6–20)
CO2: 26 mmol/L (ref 22–32)
Calcium: 7.6 mg/dL — ABNORMAL LOW (ref 8.9–10.3)
Chloride: 103 mmol/L (ref 98–111)
Creatinine, Ser: 0.58 mg/dL — ABNORMAL LOW (ref 0.61–1.24)
GFR, Estimated: >60 mL/min (ref >60)
Glucose, Bld: 113 mg/dL — ABNORMAL HIGH (ref 70–99)
Potassium: 4.2 mmol/L (ref 3.5–5.1)
Sodium: 136 mmol/L (ref 135–145)
Total Bilirubin: 0.6 mg/dL (ref 0.3–1.2)
Total Protein: 4.7 g/dL — ABNORMAL LOW (ref 6.5–8.1)

## 2020-07-12 LAB — DIC (DISSEMINATED INTRAVASCULAR COAGULATION)PANEL
D-Dimer, Quant: 6.8 ug/mL-FEU — ABNORMAL HIGH (ref 0.00–0.50)
Fibrinogen: 769 mg/dL — ABNORMAL HIGH (ref 210–475)
INR: 1.2 (ref 0.8–1.2)
Platelets: 173 10*3/uL (ref 150–400)
Prothrombin Time: 15 seconds (ref 11.4–15.2)
Smear Review: NONE SEEN
aPTT: 39 seconds — ABNORMAL HIGH (ref 24–36)

## 2020-07-12 LAB — PHOSPHORUS
Phosphorus: 3.9 mg/dL (ref 2.5–4.6)
Phosphorus: 3.1 mg/dL (ref 2.5–4.6)

## 2020-07-12 LAB — URINALYSIS, ROUTINE W REFLEX MICROSCOPIC
Bilirubin Urine: NEGATIVE
Glucose, UA: NEGATIVE mg/dL
Ketones, ur: NEGATIVE mg/dL
Nitrite: POSITIVE — AB
Protein, ur: NEGATIVE mg/dL
Specific Gravity, Urine: 1.012 (ref 1.005–1.030)
pH: 5 (ref 5.0–8.0)

## 2020-07-12 LAB — MAGNESIUM
Magnesium: 2 mg/dL (ref 1.7–2.4)
Magnesium: 1.9 mg/dL (ref 1.7–2.4)

## 2020-07-12 LAB — T4, FREE: Free T4: 0.83 ng/dL (ref 0.61–1.12)

## 2020-07-12 LAB — PROCALCITONIN: Procalcitonin: 17.8 ng/mL

## 2020-07-12 LAB — POTASSIUM: Potassium: 4.2 mmol/L (ref 3.5–5.1)

## 2020-07-12 LAB — RESP PANEL BY RT-PCR (FLU A&B, COVID) ARPGX2
Influenza A by PCR: NEGATIVE
Influenza B by PCR: NEGATIVE
SARS Coronavirus 2 by RT PCR: NEGATIVE

## 2020-07-12 LAB — TSH: TSH: 5.707 u[IU]/mL — ABNORMAL HIGH (ref 0.350–4.500)

## 2020-07-12 MED ORDER — SODIUM CHLORIDE 0.9 % IV SOLN: INTRAVENOUS | Status: DC

## 2020-07-12 MED ORDER — LACTATED RINGERS IV SOLN: INTRAVENOUS | Status: DC

## 2020-07-12 MED ORDER — SODIUM CHLORIDE 0.9 % IV SOLN
1.0000 g | INTRAVENOUS | Status: DC
  Administered 2020-07-12 – 2020-07-14 (×3): 1 g via INTRAVENOUS
  Filled 2020-07-12 (×3): qty 10

## 2020-07-12 MED ORDER — LACTATED RINGERS IV BOLUS
500.0000 mL | Freq: Once | INTRAVENOUS | Status: AC
  Administered 2020-07-12: 08:00:00 500 mL via INTRAVENOUS

## 2020-07-12 MED ORDER — IOHEXOL 350 MG/ML SOLN
100.0000 mL | Freq: Once | INTRAVENOUS | Status: AC | PRN
  Administered 2020-07-12: 61 mL via INTRAVENOUS

## 2020-07-12 NOTE — Progress Notes (Signed)
Paged for new fever of 102.7. Examined patient at bedside.  Subjective: patient able to respond with few words only, which is not a change for him. Does not appear in acute distress, and does not report any particular complaints.  Objective: Vitals:   07/12/20 0059 07/12/20 0220  BP: (!) 97/55 (!) 95/59  Pulse: (!) 110 (!) 115  Resp: 16 18  Temp: 100.1 F (37.8 C) (!) 102.7 F (39.3 C)  SpO2: 96% 100%  Vitals significant soft Bps which is new, and tachycardia which has been present for past few days.  On exam, he has scattered stage 1-2 ulcers over his sacrum, inguinal region, and lower extremity which are covered with bandages and without clear sign of infection. Also has a rash superior to his penis. Lung exam is limited, but without clear abnormality.  Assessment: Patient has had 2 other low grade temps this admission, but none in the past 5 days. Prior blood and urine cultures were negative. No clear objective finding at this point to explain his temperature, but it did improve after removing covers. BP soft and tachycardic.  Plan: -f/u WBC with morning labs -f/u blood cultures x2 -f/u CXR -normal saline 100cc/hr -monitor

## 2020-07-12 NOTE — Plan of Care (Signed)
Elevated temp this morning. Skin assessed with intern. Awaiting orders. Problem: Education: Goal: Knowledge of General Education information will improve Description: Including pain rating scale, medication(s)/side effects and non-pharmacologic comfort measures Outcome: Progressing   Problem: Health Behavior/Discharge Planning: Goal: Ability to manage health-related needs will improve Outcome: Progressing   Problem: Clinical Measurements: Goal: Ability to maintain clinical measurements within normal limits will improve Outcome: Progressing Goal: Will remain free from infection Outcome: Progressing Goal: Diagnostic test results will improve Outcome: Progressing Goal: Respiratory complications will improve Outcome: Progressing Goal: Cardiovascular complication will be avoided Outcome: Progressing   Problem: Activity: Goal: Risk for activity intolerance will decrease Outcome: Progressing   Problem: Nutrition: Goal: Adequate nutrition will be maintained Outcome: Progressing   Problem: Coping: Goal: Level of anxiety will decrease Outcome: Progressing   Problem: Elimination: Goal: Will not experience complications related to bowel motility Outcome: Progressing Goal: Will not experience complications related to urinary retention Outcome: Progressing   Problem: Pain Managment: Goal: General experience of comfort will improve Outcome: Progressing   Problem: Safety: Goal: Ability to remain free from injury will improve Outcome: Progressing   Problem: Skin Integrity: Goal: Risk for impaired skin integrity will decrease Outcome: Progressing   Problem: Safety: Goal: Non-violent Restraint(s) Outcome: Progressing
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
## 2020-07-13 LAB — COMPREHENSIVE METABOLIC PANEL
ALT: 50 U/L — ABNORMAL HIGH (ref 0–44)
AST: 44 U/L — ABNORMAL HIGH (ref 15–41)
Albumin: 1.3 g/dL — ABNORMAL LOW (ref 3.5–5.0)
Alkaline Phosphatase: 50 U/L (ref 38–126)
Anion gap: 8 (ref 5–15)
BUN: 18 mg/dL (ref 6–20)
CO2: 26 mmol/L (ref 22–32)
Calcium: 7.8 mg/dL — ABNORMAL LOW (ref 8.9–10.3)
Chloride: 103 mmol/L (ref 98–111)
Creatinine, Ser: 0.55 mg/dL — ABNORMAL LOW (ref 0.61–1.24)
GFR, Estimated: >60 mL/min (ref >60)
Glucose, Bld: 104 mg/dL — ABNORMAL HIGH (ref 70–99)
Potassium: 3.8 mmol/L (ref 3.5–5.1)
Sodium: 137 mmol/L (ref 135–145)
Total Bilirubin: 0.2 mg/dL — ABNORMAL LOW (ref 0.3–1.2)
Total Protein: 4.6 g/dL — ABNORMAL LOW (ref 6.5–8.1)

## 2020-07-13 LAB — CBC WITH DIFFERENTIAL/PLATELET
Abs Immature Granulocytes: 0.14 10*3/uL — ABNORMAL HIGH (ref 0.00–0.07)
Basophils Absolute: 0 10*3/uL (ref 0.0–0.1)
Basophils Relative: 0 %
Eosinophils Absolute: 0 10*3/uL (ref 0.0–0.5)
Eosinophils Relative: 0 %
HCT: 23.6 % — ABNORMAL LOW (ref 39.0–52.0)
Hemoglobin: 8 g/dL — ABNORMAL LOW (ref 13.0–17.0)
Immature Granulocytes: 1 %
Lymphocytes Relative: 6 %
Lymphs Abs: 0.7 10*3/uL (ref 0.7–4.0)
MCH: 31.9 pg (ref 26.0–34.0)
MCHC: 33.9 g/dL (ref 30.0–36.0)
MCV: 94 fL (ref 80.0–100.0)
Monocytes Absolute: 0.7 10*3/uL (ref 0.1–1.0)
Monocytes Relative: 6 %
Neutro Abs: 10.7 10*3/uL — ABNORMAL HIGH (ref 1.7–7.7)
Neutrophils Relative %: 87 %
Platelets: 201 10*3/uL (ref 150–400)
RBC: 2.51 MIL/uL — ABNORMAL LOW (ref 4.22–5.81)
RDW: 14.1 % (ref 11.5–15.5)
WBC: 12.4 10*3/uL — ABNORMAL HIGH (ref 4.0–10.5)
nRBC: 0 % (ref 0.0–0.2)

## 2020-07-13 LAB — PHOSPHORUS: Phosphorus: 2.9 mg/dL (ref 2.5–4.6)

## 2020-07-13 LAB — GLUCOSE, CAPILLARY
Glucose-Capillary: 102 mg/dL — ABNORMAL HIGH (ref 70–99)
Glucose-Capillary: 91 mg/dL (ref 70–99)
Glucose-Capillary: 92 mg/dL (ref 70–99)

## 2020-07-13 LAB — MAGNESIUM: Magnesium: 1.7 mg/dL (ref 1.7–2.4)
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
## 2020-07-14 LAB — COMPREHENSIVE METABOLIC PANEL
ALT: 51 U/L — ABNORMAL HIGH (ref 0–44)
AST: 45 U/L — ABNORMAL HIGH (ref 15–41)
Albumin: 1.2 g/dL — ABNORMAL LOW (ref 3.5–5.0)
Alkaline Phosphatase: 59 U/L (ref 38–126)
Anion gap: 7 (ref 5–15)
BUN: 13 mg/dL (ref 6–20)
CO2: 27 mmol/L (ref 22–32)
Calcium: 7.7 mg/dL — ABNORMAL LOW (ref 8.9–10.3)
Chloride: 103 mmol/L (ref 98–111)
Creatinine, Ser: 0.53 mg/dL — ABNORMAL LOW (ref 0.61–1.24)
GFR, Estimated: >60 mL/min (ref >60)
Glucose, Bld: 155 mg/dL — ABNORMAL HIGH (ref 70–99)
Potassium: 3.4 mmol/L — ABNORMAL LOW (ref 3.5–5.1)
Sodium: 137 mmol/L (ref 135–145)
Total Bilirubin: 0.4 mg/dL (ref 0.3–1.2)
Total Protein: 4.3 g/dL — ABNORMAL LOW (ref 6.5–8.1)

## 2020-07-14 LAB — CBC WITH DIFFERENTIAL/PLATELET
Abs Immature Granulocytes: 0.18 10*3/uL — ABNORMAL HIGH (ref 0.00–0.07)
Basophils Absolute: 0 10*3/uL (ref 0.0–0.1)
Basophils Relative: 0 %
Eosinophils Absolute: 0 10*3/uL (ref 0.0–0.5)
Eosinophils Relative: 0 %
HCT: 22.6 % — ABNORMAL LOW (ref 39.0–52.0)
Hemoglobin: 7.8 g/dL — ABNORMAL LOW (ref 13.0–17.0)
Immature Granulocytes: 1 %
Lymphocytes Relative: 9 %
Lymphs Abs: 1.3 10*3/uL (ref 0.7–4.0)
MCH: 32.4 pg (ref 26.0–34.0)
MCHC: 34.5 g/dL (ref 30.0–36.0)
MCV: 93.8 fL (ref 80.0–100.0)
Monocytes Absolute: 0.8 10*3/uL (ref 0.1–1.0)
Monocytes Relative: 6 %
Neutro Abs: 12 10*3/uL — ABNORMAL HIGH (ref 1.7–7.7)
Neutrophils Relative %: 84 %
Platelets: 245 10*3/uL (ref 150–400)
RBC: 2.41 MIL/uL — ABNORMAL LOW (ref 4.22–5.81)
RDW: 14 % (ref 11.5–15.5)
WBC: 14.4 10*3/uL — ABNORMAL HIGH (ref 4.0–10.5)
nRBC: 0 % (ref 0.0–0.2)

## 2020-07-14 LAB — MAGNESIUM: Magnesium: 1.6 mg/dL — ABNORMAL LOW (ref 1.7–2.4)

## 2020-07-14 LAB — PROTIME-INR
INR: 1.2 (ref 0.8–1.2)
Prothrombin Time: 14.8 seconds (ref 11.4–15.2)

## 2020-07-14 LAB — GLUCOSE, CAPILLARY: Glucose-Capillary: 92 mg/dL (ref 70–99)

## 2020-07-14 LAB — PHOSPHORUS: Phosphorus: 2.1 mg/dL — ABNORMAL LOW (ref 2.5–4.6)

## 2020-07-14 MED ORDER — MAGNESIUM SULFATE 4 GM/100ML IV SOLN
4.0000 g | Freq: Once | INTRAVENOUS | Status: AC
  Administered 2020-07-14: 13:00:00 4 g via INTRAVENOUS
  Filled 2020-07-14 (×2): qty 100

## 2020-07-14 MED ORDER — POTASSIUM PHOSPHATES 15 MMOLE/5ML IV SOLN
30.0000 mmol | Freq: Once | INTRAVENOUS | Status: AC
  Administered 2020-07-14: 09:00:00 30 mmol via INTRAVENOUS
  Filled 2020-07-14: qty 10
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
## 2020-07-15 ENCOUNTER — Inpatient Hospital Stay (HOSPITAL_COMMUNITY): Payer: Medicare Other

## 2020-07-15 DIAGNOSIS — J189 Pneumonia, unspecified organism: Secondary | ICD-10-CM | POA: Diagnosis not present

## 2020-07-15 DIAGNOSIS — R197 Diarrhea, unspecified: Secondary | ICD-10-CM | POA: Diagnosis not present

## 2020-07-15 DIAGNOSIS — E878 Other disorders of electrolyte and fluid balance, not elsewhere classified: Secondary | ICD-10-CM | POA: Diagnosis not present

## 2020-07-15 DIAGNOSIS — R7881 Bacteremia: Secondary | ICD-10-CM | POA: Diagnosis not present

## 2020-07-15 LAB — BLOOD CULTURE ID PANEL (REFLEXED) - BCID2

## 2020-07-15 LAB — CBC WITH DIFFERENTIAL/PLATELET
Abs Immature Granulocytes: 0.28 10*3/uL — ABNORMAL HIGH (ref 0.00–0.07)
Basophils Absolute: 0 10*3/uL (ref 0.0–0.1)
Basophils Relative: 0 %
Eosinophils Absolute: 0 10*3/uL (ref 0.0–0.5)
Eosinophils Relative: 0 %
HCT: 21.9 % — ABNORMAL LOW (ref 39.0–52.0)
Hemoglobin: 7.2 g/dL — ABNORMAL LOW (ref 13.0–17.0)
Immature Granulocytes: 2 %
Lymphocytes Relative: 5 %
Lymphs Abs: 0.9 10*3/uL (ref 0.7–4.0)
MCH: 31.2 pg (ref 26.0–34.0)
MCHC: 32.9 g/dL (ref 30.0–36.0)
MCV: 94.8 fL (ref 80.0–100.0)
Monocytes Absolute: 1 10*3/uL (ref 0.1–1.0)
Monocytes Relative: 6 %
Neutro Abs: 16.1 10*3/uL — ABNORMAL HIGH (ref 1.7–7.7)
Neutrophils Relative %: 87 %
Platelets: 294 10*3/uL (ref 150–400)
RBC: 2.31 MIL/uL — ABNORMAL LOW (ref 4.22–5.81)
RDW: 13.7 % (ref 11.5–15.5)
WBC: 18.3 10*3/uL — ABNORMAL HIGH (ref 4.0–10.5)
nRBC: 0 % (ref 0.0–0.2)

## 2020-07-15 LAB — COMPREHENSIVE METABOLIC PANEL
ALT: 61 U/L — ABNORMAL HIGH (ref 0–44)
AST: 68 U/L — ABNORMAL HIGH (ref 15–41)
Albumin: 1.2 g/dL — ABNORMAL LOW (ref 3.5–5.0)
Alkaline Phosphatase: 59 U/L (ref 38–126)
Anion gap: 8 (ref 5–15)
BUN: 16 mg/dL (ref 6–20)
CO2: 27 mmol/L (ref 22–32)
Calcium: 7.3 mg/dL — ABNORMAL LOW (ref 8.9–10.3)
Chloride: 102 mmol/L (ref 98–111)
Creatinine, Ser: 0.61 mg/dL (ref 0.61–1.24)
GFR, Estimated: >60 mL/min (ref >60)
Glucose, Bld: 123 mg/dL — ABNORMAL HIGH (ref 70–99)
Potassium: 3.7 mmol/L (ref 3.5–5.1)
Sodium: 137 mmol/L (ref 135–145)
Total Bilirubin: 0.4 mg/dL (ref 0.3–1.2)
Total Protein: 4.5 g/dL — ABNORMAL LOW (ref 6.5–8.1)

## 2020-07-15 LAB — RESP PANEL BY RT-PCR (FLU A&B, COVID) ARPGX2
Influenza A by PCR: NEGATIVE
Influenza B by PCR: NEGATIVE
SARS Coronavirus 2 by RT PCR: NEGATIVE

## 2020-07-15 LAB — MAGNESIUM: Magnesium: 1.9 mg/dL (ref 1.7–2.4)

## 2020-07-15 LAB — MRSA PCR SCREENING: MRSA by PCR: NEGATIVE

## 2020-07-15 LAB — PHOSPHORUS: Phosphorus: 2.6 mg/dL (ref 2.5–4.6)

## 2020-07-15 MED ORDER — SODIUM CHLORIDE 0.9 % IV SOLN
2.0000 g | Freq: Three times a day (TID) | INTRAVENOUS | Status: DC
  Administered 2020-07-15 – 2020-07-20 (×15): 2 g via INTRAVENOUS
  Filled 2020-07-15 (×16): qty 2

## 2020-07-15 MED ORDER — IOHEXOL 300 MG/ML  SOLN
75.0000 mL | Freq: Once | INTRAMUSCULAR | Status: AC | PRN
  Administered 2020-07-15: 75 mL via INTRAVENOUS

## 2020-07-15 MED ORDER — MAGNESIUM SULFATE 2 GM/50ML IV SOLN
2.0000 g | Freq: Once | INTRAVENOUS | Status: AC
  Administered 2020-07-15: 12:00:00 2 g via INTRAVENOUS
  Filled 2020-07-15: qty 50

## 2020-07-15 MED ORDER — POTASSIUM PHOSPHATES 15 MMOLE/5ML IV SOLN
30.0000 mmol | Freq: Once | INTRAVENOUS | Status: AC
  Administered 2020-07-15: 16:00:00 30 mmol via INTRAVENOUS
  Filled 2020-07-15: qty 10

## 2020-07-15 MED ORDER — BOOST / RESOURCE BREEZE PO LIQD CUSTOM
1.0000 | Freq: Three times a day (TID) | ORAL | Status: DC
  Administered 2020-07-15 – 2020-08-07 (×63): 1 via ORAL

## 2020-07-15 MED ORDER — SODIUM CHLORIDE 0.9 % IV SOLN
2.0000 g | INTRAVENOUS | Status: DC
  Administered 2020-07-15: 10:00:00 2 g via INTRAVENOUS
  Filled 2020-07-15: qty 2

## 2020-07-15 MED ORDER — ACETAMINOPHEN 325 MG PO TABS
650.0000 mg | ORAL_TABLET | Freq: Four times a day (QID) | ORAL | Status: DC | PRN
  Administered 2020-07-15 – 2020-08-07 (×8): 650 mg via ORAL
  Filled 2020-07-15 (×9): qty 2
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
## 2020-07-15 NOTE — Plan of Care (Signed)
  Problem: Clinical Measurements: Goal: Diagnostic test results will improve Outcome: Progressing Goal: Respiratory complications will improve Outcome: Progressing   Problem: Nutrition: Goal: Adequate nutrition will be maintained Outcome: Progressing   Problem: Elimination: Goal: Will not experience complications related to bowel motility Outcome: Progressing   Problem: Safety: Goal: Ability to remain free from injury will improve Outcome: Progressing
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
## 2020-07-15 NOTE — Progress Notes (Signed)
MD notified of Patient temp of 100.6 and heart rate of 111. The MEWS score was 3. Will start MEWS protocol.

## 2020-07-15 NOTE — Progress Notes (Signed)
Pharmacy Antibiotic Note  Andres [name] is a 39 y.o. male admitted on 07/01/2020 with HCAP and E.coli bacteremia.  Pharmacy has been consulted for cefepime dosing.  Plan: Cefepime 2gm IV q8h Monitor for deescalation and length of therapy  Height: 5\' 6"  (167.6 cm) Weight: 46.8 kg (103 lb 2.8 oz) IBW/kg (Calculated) : 63.8  Temp (24hrs), Avg:99.5 F (37.5 C), Min:98.2 F (36.8 C), Max:100.6 F (38.1 C)  Recent Labs  Lab 07/11/20 0146 07/12/20 0819 07/13/20 0327 07/14/20 0059 07/15/20 0924  WBC 7.3 8.9 12.4* 14.4* 18.3*  CREATININE 0.63 0.58* 0.55* 0.53* 0.61    Estimated Creatinine Clearance: 81.3 mL/min (by C-G formula based on SCr of 0.61 mg/dL).    Allergies  Allergen Reactions  . Lactose Diarrhea    Antimicrobials this admission: 12/17 ceftriaxone >> 12/20 12/20 cefepime >>   Dose adjustments this admission: n/a  Microbiology results: 12/17 BCx: E. coli   Aldahir Litaker A. 1/18, PharmD, BCPS, FNKF Clinical Pharmacist [place] Please utilize Amion for appropriate phone number to reach the unit pharmacist [facility])   07/15/2020 10:48 AM

## 2020-07-15 NOTE — Progress Notes (Signed)
PHARMACY - PHYSICIAN COMMUNICATION CRITICAL VALUE ALERT - BLOOD CULTURE IDENTIFICATION (BCID)  Andres [name] is an 39 y.o. male who presented to [facility] on 07/01/2020 with a chief complaint of severe malnutrition  Assessment:  Mild temp, WBC elevated  Name of physician (or Provider) Contacted: Dr. Laddie Aquas ([facility])  Current antibiotics: Ceftriaxone 1g IV q24h  Changes to prescribed antibiotics recommended:  Will increase Ceftriaxone to 2g IV q24h  Results for orders placed or performed during the hospital encounter of 07/01/20  Blood Culture ID Panel (Reflexed) (Collected: 07/12/2020  4:45 AM)  Result Value Ref Range   Enterococcus faecalis NOT DETECTED NOT DETECTED   Enterococcus Faecium NOT DETECTED NOT DETECTED   Listeria monocytogenes NOT DETECTED NOT DETECTED   Staphylococcus species NOT DETECTED NOT DETECTED   Staphylococcus aureus (BCID) NOT DETECTED NOT DETECTED   Staphylococcus epidermidis NOT DETECTED NOT DETECTED   Staphylococcus lugdunensis NOT DETECTED NOT DETECTED   Streptococcus species NOT DETECTED NOT DETECTED   Streptococcus agalactiae NOT DETECTED NOT DETECTED   Streptococcus pneumoniae NOT DETECTED NOT DETECTED   Streptococcus pyogenes NOT DETECTED NOT DETECTED   A.calcoaceticus-baumannii NOT DETECTED NOT DETECTED   Bacteroides fragilis NOT DETECTED NOT DETECTED   Enterobacterales DETECTED (A) NOT DETECTED   Enterobacter cloacae complex NOT DETECTED NOT DETECTED   Escherichia coli DETECTED (A) NOT DETECTED   Klebsiella aerogenes NOT DETECTED NOT DETECTED   Klebsiella oxytoca NOT DETECTED NOT DETECTED   Klebsiella pneumoniae NOT DETECTED NOT DETECTED   Proteus species NOT DETECTED NOT DETECTED   Salmonella species NOT DETECTED NOT DETECTED   Serratia marcescens NOT DETECTED NOT DETECTED   Haemophilus influenzae NOT DETECTED NOT DETECTED   Neisseria meningitidis NOT DETECTED NOT DETECTED   Pseudomonas aeruginosa NOT DETECTED NOT DETECTED    Stenotrophomonas maltophilia NOT DETECTED NOT DETECTED   Candida albicans NOT DETECTED NOT DETECTED   Candida auris NOT DETECTED NOT DETECTED   Candida glabrata NOT DETECTED NOT DETECTED   Candida krusei NOT DETECTED NOT DETECTED   Candida parapsilosis NOT DETECTED NOT DETECTED   Candida tropicalis NOT DETECTED NOT DETECTED   Cryptococcus neoformans/gattii NOT DETECTED NOT DETECTED   CTX-M ESBL NOT DETECTED NOT DETECTED   Carbapenem resistance IMP NOT DETECTED NOT DETECTED   Carbapenem resistance KPC NOT DETECTED NOT DETECTED   Carbapenem resistance NDM NOT DETECTED NOT DETECTED   Carbapenem resist OXA 48 LIKE NOT DETECTED NOT DETECTED   Carbapenem resistance VIM NOT DETECTED NOT DETECTED    Abran Duke 07/15/2020  7:03 AM

## 2020-07-16 ENCOUNTER — Inpatient Hospital Stay (HOSPITAL_COMMUNITY): Payer: Medicare Other

## 2020-07-16 DIAGNOSIS — J90 Pleural effusion, not elsewhere classified: Secondary | ICD-10-CM

## 2020-07-16 DIAGNOSIS — J189 Pneumonia, unspecified organism: Secondary | ICD-10-CM | POA: Diagnosis not present

## 2020-07-16 DIAGNOSIS — R7881 Bacteremia: Secondary | ICD-10-CM | POA: Diagnosis not present

## 2020-07-16 LAB — COMPREHENSIVE METABOLIC PANEL
ALT: 69 U/L — ABNORMAL HIGH (ref 0–44)
AST: 86 U/L — ABNORMAL HIGH (ref 15–41)
Albumin: 1.1 g/dL — ABNORMAL LOW (ref 3.5–5.0)
Alkaline Phosphatase: 64 U/L (ref 38–126)
Anion gap: 8 (ref 5–15)
BUN: 12 mg/dL (ref 6–20)
CO2: 28 mmol/L (ref 22–32)
Calcium: 7.4 mg/dL — ABNORMAL LOW (ref 8.9–10.3)
Chloride: 102 mmol/L (ref 98–111)
Creatinine, Ser: 0.58 mg/dL — ABNORMAL LOW (ref 0.61–1.24)
GFR, Estimated: >60 mL/min (ref >60)
Glucose, Bld: 105 mg/dL — ABNORMAL HIGH (ref 70–99)
Potassium: 3.9 mmol/L (ref 3.5–5.1)
Sodium: 138 mmol/L (ref 135–145)
Total Bilirubin: 0.4 mg/dL (ref 0.3–1.2)
Total Protein: 4.5 g/dL — ABNORMAL LOW (ref 6.5–8.1)

## 2020-07-16 LAB — CBC WITH DIFFERENTIAL/PLATELET
Abs Immature Granulocytes: 0.23 10*3/uL — ABNORMAL HIGH (ref 0.00–0.07)
Basophils Absolute: 0 10*3/uL (ref 0.0–0.1)
Basophils Relative: 0 %
Eosinophils Absolute: 0.1 10*3/uL (ref 0.0–0.5)
Eosinophils Relative: 0 %
HCT: 25.2 % — ABNORMAL LOW (ref 39.0–52.0)
Hemoglobin: 8 g/dL — ABNORMAL LOW (ref 13.0–17.0)
Immature Granulocytes: 1 %
Lymphocytes Relative: 8 %
Lymphs Abs: 1.2 10*3/uL (ref 0.7–4.0)
MCH: 30.7 pg (ref 26.0–34.0)
MCHC: 31.7 g/dL (ref 30.0–36.0)
MCV: 96.6 fL (ref 80.0–100.0)
Monocytes Absolute: 1.3 10*3/uL — ABNORMAL HIGH (ref 0.1–1.0)
Monocytes Relative: 8 %
Neutro Abs: 13.6 10*3/uL — ABNORMAL HIGH (ref 1.7–7.7)
Neutrophils Relative %: 83 %
Platelets: 340 10*3/uL (ref 150–400)
RBC: 2.61 MIL/uL — ABNORMAL LOW (ref 4.22–5.81)
RDW: 13.9 % (ref 11.5–15.5)
WBC: 16.5 10*3/uL — ABNORMAL HIGH (ref 4.0–10.5)
nRBC: 0 % (ref 0.0–0.2)

## 2020-07-16 LAB — LACTATE DEHYDROGENASE: LDH: 301 U/L — ABNORMAL HIGH (ref 98–192)

## 2020-07-16 LAB — FECAL FAT, QUALITATIVE
Fat Qual Neutral, Stl: NORMAL
Fat Qual Total, Stl: NORMAL

## 2020-07-16 LAB — PHOSPHORUS: Phosphorus: 3.1 mg/dL (ref 2.5–4.6)

## 2020-07-16 LAB — MAGNESIUM: Magnesium: 1.9 mg/dL (ref 1.7–2.4)

## 2020-07-16 MED ORDER — POTASSIUM CHLORIDE CRYS ER 20 MEQ PO TBCR
30.0000 meq | EXTENDED_RELEASE_TABLET | Freq: Once | ORAL | Status: AC
  Administered 2020-07-16: 11:00:00 30 meq via ORAL
  Filled 2020-07-16: qty 1

## 2020-07-16 MED ORDER — POTASSIUM PHOSPHATES 15 MMOLE/5ML IV SOLN
10.0000 mmol | Freq: Once | INTRAVENOUS | Status: AC
  Administered 2020-07-16: 12:00:00 10 mmol via INTRAVENOUS
  Filled 2020-07-16: qty 3.33

## 2020-07-16 MED ORDER — LACTASE 3000 UNITS PO TABS
3000.0000 [IU] | ORAL_TABLET | Freq: Three times a day (TID) | ORAL | Status: DC
  Administered 2020-07-16 – 2020-08-20 (×90): 3000 [IU] via ORAL
  Filled 2020-07-16 (×108): qty 1

## 2020-07-16 MED ORDER — POTASSIUM CHLORIDE CRYS ER 20 MEQ PO TBCR: 40.0000 meq | EXTENDED_RELEASE_TABLET | Freq: Once | ORAL | Status: DC

## 2020-07-16 MED ORDER — WHITE PETROLATUM EX OINT
TOPICAL_OINTMENT | CUTANEOUS | Status: AC
  Filled 2020-07-16: qty 28.35

## 2020-07-16 MED ORDER — MAGNESIUM SULFATE 2 GM/50ML IV SOLN
2.0000 g | Freq: Once | INTRAVENOUS | Status: AC
  Administered 2020-07-16: 11:00:00 2 g via INTRAVENOUS
  Filled 2020-07-16: qty 50
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
## 2020-07-17 ENCOUNTER — Inpatient Hospital Stay (HOSPITAL_COMMUNITY): Payer: Medicare Other

## 2020-07-17 DIAGNOSIS — I639 Cerebral infarction, unspecified: Secondary | ICD-10-CM | POA: Diagnosis not present

## 2020-07-17 DIAGNOSIS — R7881 Bacteremia: Secondary | ICD-10-CM | POA: Diagnosis not present

## 2020-07-17 DIAGNOSIS — J869 Pyothorax without fistula: Secondary | ICD-10-CM

## 2020-07-17 DIAGNOSIS — J90 Pleural effusion, not elsewhere classified: Secondary | ICD-10-CM | POA: Diagnosis not present

## 2020-07-17 DIAGNOSIS — R2990 Unspecified symptoms and signs involving the nervous system: Secondary | ICD-10-CM

## 2020-07-17 LAB — COMPREHENSIVE METABOLIC PANEL
ALT: 81 U/L — ABNORMAL HIGH (ref 0–44)
AST: 81 U/L — ABNORMAL HIGH (ref 15–41)
Albumin: 1.3 g/dL — ABNORMAL LOW (ref 3.5–5.0)
Alkaline Phosphatase: 73 U/L (ref 38–126)
Anion gap: 9 (ref 5–15)
BUN: 11 mg/dL (ref 6–20)
CO2: 27 mmol/L (ref 22–32)
Calcium: 7.7 mg/dL — ABNORMAL LOW (ref 8.9–10.3)
Chloride: 101 mmol/L (ref 98–111)
Creatinine, Ser: 0.51 mg/dL — ABNORMAL LOW (ref 0.61–1.24)
GFR, Estimated: >60 mL/min (ref >60)
Glucose, Bld: 119 mg/dL — ABNORMAL HIGH (ref 70–99)
Potassium: 3.8 mmol/L (ref 3.5–5.1)
Sodium: 137 mmol/L (ref 135–145)
Total Bilirubin: 0.6 mg/dL (ref 0.3–1.2)
Total Protein: 5.2 g/dL — ABNORMAL LOW (ref 6.5–8.1)

## 2020-07-17 LAB — PROTIME-INR
INR: 1.3 — ABNORMAL HIGH (ref 0.8–1.2)
Prothrombin Time: 15.2 seconds (ref 11.4–15.2)

## 2020-07-17 LAB — CBC WITH DIFFERENTIAL/PLATELET
Abs Immature Granulocytes: 0.22 10*3/uL — ABNORMAL HIGH (ref 0.00–0.07)
Basophils Absolute: 0 10*3/uL (ref 0.0–0.1)
Basophils Relative: 0 %
Eosinophils Absolute: 0.1 10*3/uL (ref 0.0–0.5)
Eosinophils Relative: 0 %
HCT: 24.5 % — ABNORMAL LOW (ref 39.0–52.0)
Hemoglobin: 8.4 g/dL — ABNORMAL LOW (ref 13.0–17.0)
Immature Granulocytes: 1 %
Lymphocytes Relative: 8 %
Lymphs Abs: 1.2 10*3/uL (ref 0.7–4.0)
MCH: 31.7 pg (ref 26.0–34.0)
MCHC: 34.3 g/dL (ref 30.0–36.0)
MCV: 92.5 fL (ref 80.0–100.0)
Monocytes Absolute: 1.4 10*3/uL — ABNORMAL HIGH (ref 0.1–1.0)
Monocytes Relative: 9 %
Neutro Abs: 12.8 10*3/uL — ABNORMAL HIGH (ref 1.7–7.7)
Neutrophils Relative %: 82 %
Platelets: 370 10*3/uL (ref 150–400)
RBC: 2.65 MIL/uL — ABNORMAL LOW (ref 4.22–5.81)
RDW: 13.6 % (ref 11.5–15.5)
WBC: 15.7 10*3/uL — ABNORMAL HIGH (ref 4.0–10.5)
nRBC: 0 % (ref 0.0–0.2)

## 2020-07-17 LAB — CULTURE, BLOOD (ROUTINE X 2)
Culture: NO GROWTH
Special Requests: ADEQUATE

## 2020-07-17 LAB — GLUCOSE, CAPILLARY: Glucose-Capillary: 100 mg/dL — ABNORMAL HIGH (ref 70–99)

## 2020-07-17 LAB — PANCREATIC ELASTASE, FECAL: Pancreatic Elastase-1, Stool: 288 ug Elast./g (ref >200)

## 2020-07-17 LAB — PHOSPHORUS: Phosphorus: 2.6 mg/dL (ref 2.5–4.6)

## 2020-07-17 LAB — APTT: aPTT: 41 seconds — ABNORMAL HIGH (ref 24–36)

## 2020-07-17 LAB — MAGNESIUM: Magnesium: 1.7 mg/dL (ref 1.7–2.4)

## 2020-07-17 MED ORDER — MAGNESIUM SULFATE 4 GM/100ML IV SOLN
4.0000 g | Freq: Once | INTRAVENOUS | Status: AC
  Administered 2020-07-17: 08:00:00 4 g via INTRAVENOUS
  Filled 2020-07-17: qty 100

## 2020-07-17 MED ORDER — POTASSIUM CHLORIDE CRYS ER 20 MEQ PO TBCR
40.0000 meq | EXTENDED_RELEASE_TABLET | Freq: Once | ORAL | Status: AC
  Administered 2020-07-17: 08:00:00 40 meq via ORAL
  Filled 2020-07-17: qty 2

## 2020-07-17 MED ORDER — POTASSIUM PHOSPHATES 15 MMOLE/5ML IV SOLN
20.0000 mmol | Freq: Once | INTRAVENOUS | Status: AC
  Administered 2020-07-17: 08:00:00 20 mmol via INTRAVENOUS
  Filled 2020-07-17: qty 6.67

## 2020-07-17 MED ORDER — LACTATED RINGERS IV BOLUS
1000.0000 mL | Freq: Once | INTRAVENOUS | Status: AC
  Administered 2020-07-17: 11:00:00 1000 mL via INTRAVENOUS

## 2020-07-17 NOTE — Progress Notes (Signed)
[facility]ST paged to code stroke. Upon arrival at bedside, spoke with responding physician who stated pt would not need contrast with this CT. No further IV access needed at this time.

## 2020-07-17 NOTE — Code Documentation (Signed)
Stroke Response Nurse Documentation Code Documentation  Andres [name] is a 39 y.o. male admitted to [facility] [place] on 12/7 with past medical hx of fall, fetal alcohol syndrome with complicated course including GI Bleeding, Malnutrition, anemia, and refeeding syndrome. Code stroke was activated by Admitting MD. He was LKW at 1900 when the day shift nurse gave report. Upon morning assessment, nurse noted left arm flaccid which was new for patient. Called provider and Code Stroke activation noted.   Stroke team at the bedside after activation. Labs drawn and patient cleared for CT. Patient to CT with team. NIHSS 21, see documentation for details and code stroke times. Patient with disoriented, not following commands, bilateral arm weakness, bilateral leg weakness, left decreased sensation and Global aphasia  on exam which is inline with baseline except new left arm weakness. The following imaging was completed: CT Head. Patient is not a candidate for tPA due to being outside window. Care/Plan: q4 hour mNIHSS/VS, Neurovascular Checks.  Rapid Response completed call with primary RN due to second Code Stroke.    21 - Spoke with RN 3 [address] to confirm understanding of patient plan.   Lucila Maine  Stroke Response RN

## 2020-07-17 NOTE — Consult Note (Addendum)
[address]       [place] [id number]             [phone]        Francena HanlyRobert Dragos [facility] Medical Record #[id number] Date of Birth: [birth year]  Referring: Dr. Laddie AquasSpeakman, MD Primary Care: Leilani Ableeese, Betti, MD  Chief Complaint:    Chief Complaint  Patient presents with   Fall  Reason for consult: Right pleural effusion  History of Present Illness:     This is a 39 year old African American male who [facility]/2020 after a fall in the shower. The patient has a history of autism, fetal alcohol syndrome, seizures, GERD, depression, and is non verbal. No one is at his bedside so this history was obtained from previous medical information.  Patient being treated for pneumonia and E. Coli bacteremia is on Cefepime. CT of chest with contrast done 07/15/2020 showed increase in right pleural effusion and upper lobe consolidation.Most recent chest x ray done 07/15/2020 shows low lung volumes, bibasilar ateletasis, density in right middle lung (possible infiltrate), small pleural effusion, and mild gastric distention. IR was consulted and noted a small, complex right pleural effusion with no window safe for right thoracentesis. Dr. Tyrone SageGerhardt has now been consulted for management of the right pleural effusion/?empyema. He is not in respiratory distress at this time.  Current Activity/ Functional Status: Patient is not independent with mobility/ambulation, transfers, ADL's, IADL's.   Zubrod Score: At the time of surgery this patient's most appropriate activity status/level should be described as: []     0    Normal activity, no symptoms []     1    Restricted in physical strenuous activity but ambulatory, able to do out light work []     2    Ambulatory and capable of self care, unable to do work activities, up and about more than 50%  Of the time                            []     3    Only limited self care, in bed greater than 50% of waking hours [x]     4     Completely disabled, no self care, confined to bed or chair []     5    Moribund  Past Medical History:  Diagnosis Date   Autism    Depression    Disorder of intellectual development    GERD (gastroesophageal reflux disease)    History of seizures     Past Surgical History:  Procedure Laterality Date   BIOPSY  07/07/2020   Procedure: BIOPSY;  Surgeon: Meryl DareStark, Malcolm T, MD;  [facility];  Service: Endoscopy;;   ESOPHAGOGASTRODUODENOSCOPY N/A 07/07/2020   Procedure: ESOPHAGOGASTRODUODENOSCOPY (EGD);  Surgeon: Meryl DareStark, Malcolm T, MD;  [facility];  Service: Endoscopy;  Laterality: N/A;   HOT HEMOSTASIS N/A 07/07/2020   Procedure: HOT HEMOSTASIS (ARGON PLASMA COAGULATION/BICAP);  Surgeon: Meryl DareStark, Malcolm T, MD;  [facility];  Service: Endoscopy;  Laterality: N/A;    Social History   Tobacco Use  Smoking Status Never Smoker  Smokeless Tobacco Never Used    Social History   Substance and Sexual Activity  Alcohol Use Not Currently     Allergies  Allergen Reactions   Lactose Diarrhea    Current Facility-Administered Medications  Medication Dose Route Frequency Provider Last Rate Last Admin   0.9 %  sodium chloride infusion (Manually program via Guardrails IV Fluids)   Intravenous Once Seawell, Jaimie A, DO       acetaminophen (TYLENOL) tablet 650 mg  650 mg Oral Q6H PRN Verdene Lennert, MD   650 mg at 07/15/20 1006   ARIPiprazole (ABILIFY) tablet 10 mg  10 mg Oral Daily Verdene Lennert, MD   10 mg at 07/16/20 1003   ceFEPIme (MAXIPIME) 2 g in sodium chloride 0.9 % 100 mL IVPB  2 g Intravenous Q8H Pierce, Dwayne A, RPH 200 mL/hr at 07/17/20 0558 2 g at 07/17/20 0558   feeding supplement (BOOST / RESOURCE BREEZE) liquid 1 Container  1 Container Oral TID BM Remo Lipps, MD   1 Container at 07/16/20 1956   lactase (LACTAID) tablet 3,000 Units  3,000 Units Oral TID WC Glenford Bayley, MD   3,000 Units at 07/17/20 0803   lactated ringers bolus 1,000  mL  1,000 mL Intravenous Once Verdene Lennert, MD       lactated ringers infusion   Intravenous Continuous Glenford Bayley, MD 100 mL/hr at 07/17/20 0156 New Bag at 07/17/20 0156   multivitamin with minerals tablet 1 tablet  1 tablet Oral Daily Reymundo Poll, MD   1 tablet at 07/16/20 1003   pantoprazole (PROTONIX) EC tablet 40 mg  40 mg Oral BID Meryl Dare, MD   40 mg at 07/16/20 2241   potassium PHOSPHATE 20 mmol in dextrose 5 % 500 mL infusion  20 mmol Intravenous Once Glenford Bayley, MD       sodium chloride flush (NS) 0.9 % injection 10-40 mL  10-40 mL Intracatheter Q12H Reymundo Poll, MD   10 mL at 07/16/20 1015   sodium chloride flush (NS) 0.9 % injection 10-40 mL  10-40 mL Intracatheter PRN Reymundo Poll, MD       traZODone (DESYREL) tablet 100 mg  100 mg Oral QPM Verdene Lennert, MD   100 mg at 07/16/20 2241    Medications Prior to Admission  Medication Sig Dispense Refill Last Dose   ARIPiprazole (ABILIFY) 10 MG tablet Take 10 mg by mouth in the morning.    06/30/2020 at 0800   Cholecalciferol (VITAMIN D-3) 25 MCG (1000 UT) CAPS Take 1,000 Units by mouth daily.    06/30/2020 at Unknown time   famotidine (PEPCID) 20 MG tablet Take 20 mg by mouth 2 (two) times daily.   06/30/2020 at 2000   hydrOXYzine (ATARAX/VISTARIL) 25 MG tablet Take 25 mg by mouth daily as needed (for agitation).    unk   traZODone (DESYREL) 100 MG tablet Take 100 mg by mouth at bedtime.    06/30/2020 at pm    Family History  Problem Relation Age of Onset   Mental illness Mother     Review of Systems:  Patient not with cough. Had fever to 100.5 earlier.    Physical Exam: BP 117/77 (BP Location: Right Arm)   Pulse 96   Temp 98.3 F (36.8 C) (Axillary)   Resp 20   Ht 5\' 6"  (1.676 m)   Wt 46.8 kg   SpO2 97%   BMI 16.65 kg/m    General appearance: no distress and slowed mentation Neck: no JVD and supple, symmetrical, trachea midline Resp: Diminished basilar breath sounds  R>L Cardio: RRR Extremities: Legs contracted and he has edema in left hand and both feet Neurologic: He was able to move both arms  Diagnostic Studies & Laboratory data:     Recent Radiology Findings:  Echo done  07/11/2020: 1. Left ventricular ejection fraction, by estimation, is 55 to 60%. The left ventricle has normal function. The left ventricle has no regional wall motion abnormalities. Left ventricular diastolic parameters are indeterminate.   2. Right ventricular systolic function is normal. The right ventricular size is normal. Tricuspid regurgitation signal is inadequate for assessing  PA pressure.   3. A small pericardial effusion is present.   4. The mitral valve is normal in structure. Mild mitral valve  regurgitation. No evidence of mitral stenosis.   5. The aortic valve is tricuspid. Aortic valve regurgitation is not  visualized. No aortic stenosis is present.    DG Abd 1 View  Result Date: 07/16/2020 CLINICAL DATA:  Abdominal distention. EXAM: ABDOMEN - 1 VIEW COMPARISON:  CT chest 07/15/2020. Chest x-ray 07/15/2020. CT 07/07/2020. Abdomen 07/07/2020. FINDINGS: Persistent gastric distention. Stool noted in colon. No free air. Patchy scratched it bibasilar atelectasis/infiltrates. Right pleural effusion noted. Persistent distended loops of small and large bowel. No free air. Probable pill fragment in the stomach. Thoracolumbar spine scoliosis. IMPRESSION: 1. Persistent gastric distention. Persistent distended loops of small and large bowel. 2. Bibasilar atelectasis/infiltrates. Right pleural effusion. Electronically Signed   By: Maisie Fus  Register   On: 07/16/2020 13:09   CT HEAD WO CONTRAST  Result Date: 07/17/2020 CLINICAL DATA:  Altered mental status. EXAM: CT HEAD WITHOUT CONTRAST TECHNIQUE: Contiguous axial images were obtained from the base of the skull through the vertex without intravenous contrast. COMPARISON:  None. FINDINGS: Brain: No evidence of acute infarction,  hemorrhage, hydrocephalus, extra-axial collection or mass lesion/mass effect. The exam is degraded by patient motion. Vascular: No hyperdense vessel or unexpected calcification. Skull: Intact.  No focal lesion. Sinuses/Orbits: Negative. Other: None. IMPRESSION: No acute abnormality. Electronically Signed   By: Drusilla Kanner M.D.   On: 07/17/2020 10:27   CT CHEST W CONTRAST  Result Date: 07/15/2020 CLINICAL DATA:  Follow-up multifocal pneumonia EXAM: CT CHEST WITH CONTRAST TECHNIQUE: Multidetector CT imaging of the chest was performed during intravenous contrast administration. CONTRAST:  49mL OMNIPAQUE IOHEXOL 300 MG/ML  SOLN COMPARISON:  CT from 07/12/2020 FINDINGS: Cardiovascular: Thoracic aorta shows no aneurysmal dilatation or dissection. Heart is mildly enlarged in size. Pulmonary artery shows no large central embolus although timing was not performed for embolus evaluation. Mediastinum/Nodes: Thoracic inlet is within normal limits. No hilar or mediastinal adenopathy is noted. The esophagus as visualized is within normal limits. Lungs/Pleura: The lungs again demonstrate bilateral lower lobe infiltrates with associated effusions the degree of fluid on the right has increased with some apparent loculation within the major fissure. Some increase in the degree of consolidation in the right upper lobe as occurred as well related to the effusions. Upper Abdomen: No acute abnormality noted. Musculoskeletal: No chest wall abnormality. No acute or significant osseous findings. IMPRESSION: Increase in right-sided pleural effusion and upper lobe consolidation. The remainder of the exam is stable from 3 days previous. Electronically Signed   By: Alcide Clever M.D.   On: 07/15/2020 14:25   IR US CHEST  Result Date: 07/16/2020 CLINICAL DATA:  39 year old male with history of multifocal pneumonia and right pleural effusion. EXAM: CHEST ULTRASOUND COMPARISON:  Chest CT from 07/15/2020 FINDINGS: Small right pleural  fluid noted, mostly complex. No safe window for thoracentesis. IMPRESSION: Complex, small right pleural effusion. No safe window for thoracentesis at this time. Marliss Coots, MD [facility] Electronically Signed   By: Marliss Coots MD   On: 07/16/2020 13:20  I have independently reviewed the above radiologic studies and discussed with the patient   Recent Lab Findings: Lab Results  Component Value Date   WBC 15.7 (H) 07/17/2020   HGB 8.4 (L) 07/17/2020   HCT 24.5 (L) 07/17/2020   PLT 370 07/17/2020   GLUCOSE 119 (H) 07/17/2020   ALT 81 (H) 07/17/2020   AST 81 (H) 07/17/2020   NA 137 07/17/2020   K 3.8 07/17/2020   CL 101 07/17/2020   CREATININE 0.51 (L) 07/17/2020   BUN 11 07/17/2020   CO2 27 07/17/2020   TSH 5.707 (H) 07/12/2020   INR 1.3 (H) 07/17/2020   HGBA1C 5.9 (H) 07/03/2020   Assessment / Plan:   1. Right pleural effusion,? empyema-Patient not a surgical candidate. As discussed with Dr. Tyrone Sage, will ask IR to place a pigtail catheter under guidance on the right  2. Possible distal stroke in right MCA distribution. CT of the head did not show acute abnormality. Neurology following. 3. Leukocytosis-WBC this am 15,700. Being treated for PNA and below 4. E. Coli bacteremia-on Cefepime (day 6). Await blood culture results. 5. Severe malnutrition, gastric distention, distended loops of bowel-treatment per primary 6. Elevated transaminases-AST 81 and ALT 81 this am. ? if from malnutrition. Per primary.  I  spent 15 minutes counseling the patient face to face.   Doree Fudge PA-C 07/17/2020 1:18 PM  Called today - has been in hospital dec 6 - 17 days . Results of scans and past history reviewed . Patient is not candidate for surgical intervention.  Recommend attempt drainage percutaneously.   Delight Ovens MD Beeper [phone] Office [phone] 07/17/2020 3:15 PM

## 2020-07-17 NOTE — Care Management Important Message (Signed)
Important Message  Patient Details  Name: Andres [name] MRN: [id number] Date of Birth: 27-Nov-1980   Medicare Important Message Given:  Yes     [name] [name] 07/17/2020, 3:57 PM
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
## 2020-07-17 NOTE — Progress Notes (Signed)
IR consulted by Doree Fudge, PA-C for possible image-guided right chest tube placement.  Case will need to be reviewed tomorrow AM by IR MD prior to approval, will make NPO at midnight in case procedure approved.  Please call IR with questions/concerns.   Waylan Boga Hanley Rispoli, PA-C 07/17/2020, 4:37 PM

## 2020-07-17 NOTE — Care Management Important Message (Signed)
Important Message  Patient Details  Name: Andres [name] MRN: [id number] Date of Birth: January 07, 1981   Medicare Important Message Given:  Yes  Due to the patients medical condition IM mailed to the patient address.   Daryle Amis 07/17/2020, 3:27 PM

## 2020-07-18 ENCOUNTER — Encounter (HOSPITAL_COMMUNITY): Payer: Self-pay | Admitting: Internal Medicine

## 2020-07-18 ENCOUNTER — Inpatient Hospital Stay (HOSPITAL_COMMUNITY): Payer: Medicare Other

## 2020-07-18 HISTORY — PX: IR THORACENTESIS ASP PLEURAL SPACE W/IMG GUIDE: IMG5380

## 2020-07-18 LAB — CBC WITH DIFFERENTIAL/PLATELET
Abs Immature Granulocytes: 0.17 10*3/uL — ABNORMAL HIGH (ref 0.00–0.07)
Basophils Absolute: 0 10*3/uL (ref 0.0–0.1)
Basophils Relative: 0 %
Eosinophils Absolute: 0 10*3/uL (ref 0.0–0.5)
Eosinophils Relative: 0 %
HCT: 20.5 % — ABNORMAL LOW (ref 39.0–52.0)
Hemoglobin: 7.1 g/dL — ABNORMAL LOW (ref 13.0–17.0)
Immature Granulocytes: 1 %
Lymphocytes Relative: 10 %
Lymphs Abs: 1.3 10*3/uL (ref 0.7–4.0)
MCH: 32.3 pg (ref 26.0–34.0)
MCHC: 34.6 g/dL (ref 30.0–36.0)
MCV: 93.2 fL (ref 80.0–100.0)
Monocytes Absolute: 1 10*3/uL (ref 0.1–1.0)
Monocytes Relative: 8 %
Neutro Abs: 10.7 10*3/uL — ABNORMAL HIGH (ref 1.7–7.7)
Neutrophils Relative %: 81 %
Platelets: 412 10*3/uL — ABNORMAL HIGH (ref 150–400)
RBC: 2.2 MIL/uL — ABNORMAL LOW (ref 4.22–5.81)
RDW: 13.4 % (ref 11.5–15.5)
WBC: 13.2 10*3/uL — ABNORMAL HIGH (ref 4.0–10.5)
nRBC: 0 % (ref 0.0–0.2)

## 2020-07-18 LAB — COMPREHENSIVE METABOLIC PANEL
ALT: 78 U/L — ABNORMAL HIGH (ref 0–44)
AST: 70 U/L — ABNORMAL HIGH (ref 15–41)
Albumin: 1.1 g/dL — ABNORMAL LOW (ref 3.5–5.0)
Alkaline Phosphatase: 68 U/L (ref 38–126)
Anion gap: 7 (ref 5–15)
BUN: 11 mg/dL (ref 6–20)
CO2: 27 mmol/L (ref 22–32)
Calcium: 7.5 mg/dL — ABNORMAL LOW (ref 8.9–10.3)
Chloride: 100 mmol/L (ref 98–111)
Creatinine, Ser: 0.53 mg/dL — ABNORMAL LOW (ref 0.61–1.24)
GFR, Estimated: >60 mL/min (ref >60)
Glucose, Bld: 118 mg/dL — ABNORMAL HIGH (ref 70–99)
Potassium: 3.5 mmol/L (ref 3.5–5.1)
Sodium: 134 mmol/L — ABNORMAL LOW (ref 135–145)
Total Bilirubin: 0.4 mg/dL (ref 0.3–1.2)
Total Protein: 4.9 g/dL — ABNORMAL LOW (ref 6.5–8.1)

## 2020-07-18 LAB — BODY FLUID CELL COUNT WITH DIFFERENTIAL
Eos, Fluid: 0 %
Lymphs, Fluid: 7 %
Monocyte-Macrophage-Serous Fluid: 2 % — ABNORMAL LOW (ref 50–90)
Neutrophil Count, Fluid: 91 % — ABNORMAL HIGH (ref 0–25)
Total Nucleated Cell Count, Fluid: 20150 cu mm — ABNORMAL HIGH (ref 0–1000)

## 2020-07-18 LAB — LACTATE DEHYDROGENASE, PLEURAL OR PERITONEAL FLUID: LD, Fluid: 942 U/L — ABNORMAL HIGH (ref 3–23)

## 2020-07-18 LAB — LACTATE DEHYDROGENASE: LDH: 324 U/L — ABNORMAL HIGH (ref 98–192)

## 2020-07-18 LAB — MAGNESIUM: Magnesium: 1.9 mg/dL (ref 1.7–2.4)

## 2020-07-18 LAB — PROTEIN, PLEURAL OR PERITONEAL FLUID: Total protein, fluid: <3 g/dL

## 2020-07-18 LAB — PROTIME-INR
INR: 1.2 (ref 0.8–1.2)
Prothrombin Time: 15.1 seconds (ref 11.4–15.2)

## 2020-07-18 LAB — PHOSPHORUS: Phosphorus: 2.6 mg/dL (ref 2.5–4.6)

## 2020-07-18 MED ORDER — LORAZEPAM 2 MG/ML IJ SOLN
INTRAMUSCULAR | Status: AC | PRN
Start: 2020-07-18 — End: 2020-07-18

## 2020-07-18 MED ORDER — MIDAZOLAM HCL 2 MG/2ML IJ SOLN
INTRAMUSCULAR | Status: AC
  Filled 2020-07-18: qty 2

## 2020-07-18 MED ORDER — CHLORHEXIDINE GLUCONATE CLOTH 2 % EX PADS
6.0000 | MEDICATED_PAD | Freq: Every day | CUTANEOUS | Status: DC
  Administered 2020-07-18 – 2020-08-07 (×17): 6 via TOPICAL

## 2020-07-18 MED ORDER — POTASSIUM PHOSPHATES 15 MMOLE/5ML IV SOLN
20.0000 mmol | Freq: Once | INTRAVENOUS | Status: AC
  Administered 2020-07-18: 15:00:00 20 mmol via INTRAVENOUS
  Filled 2020-07-18: qty 6.67

## 2020-07-18 MED ORDER — LIDOCAINE HCL (PF) 1 % IJ SOLN
INTRAMUSCULAR | Status: AC
  Filled 2020-07-18: qty 30

## 2020-07-18 MED ORDER — MIDAZOLAM HCL 2 MG/2ML IJ SOLN
INTRAMUSCULAR | Status: AC | PRN
  Administered 2020-07-18 (×2): 0.5 mg via INTRAVENOUS

## 2020-07-18 MED ORDER — LIDOCAINE HCL 1 % IJ SOLN
INTRAMUSCULAR | Status: AC | PRN
  Administered 2020-07-18: 20 mL via INTRADERMAL

## 2020-07-18 MED ORDER — FENTANYL CITRATE (PF) 100 MCG/2ML IJ SOLN
INTRAMUSCULAR | Status: AC | PRN
  Administered 2020-07-18: 12.5 ug via INTRAVENOUS

## 2020-07-18 MED ORDER — HYDROMORPHONE HCL 1 MG/ML IJ SOLN: INTRAMUSCULAR | Status: AC | PRN

## 2020-07-18 MED ORDER — MAGNESIUM SULFATE 2 GM/50ML IV SOLN
2.0000 g | Freq: Once | INTRAVENOUS | Status: AC
  Administered 2020-07-18: 16:00:00 2 g via INTRAVENOUS
  Filled 2020-07-18: qty 50

## 2020-07-18 MED ORDER — FENTANYL CITRATE (PF) 100 MCG/2ML IJ SOLN
INTRAMUSCULAR | Status: AC
  Filled 2020-07-18: qty 2
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
## 2020-07-18 NOTE — Progress Notes (Signed)
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 Pt found to have upper GI bleed 2/2 angiodysplasia (resolved) and acute normocytic anemia (resolved). 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 Per MD, hiatal hernia and Grade D esophageal reflux may be contributing to chronic vomiting. Follow up gastrografin follow through study showed normal passage through the colon and rectum. Pt was treated conservatively with NGT decompression and N/V have since resolved.  Pt nonverbal at baseline.   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 Biopsied.  12/13 diet advanced to soft; GI signed off 12/14 diet advanced to regular  Pt developed a fever overnight on 12/16. Chest imaging performed 12/17 to rule out PE and revealed likely RUL/RLL PNA, moderate loculated R  pleural effusion, and subsegmental atelectasis. CXR on 12/20 revealed rapid progression of findings from 12/17. IR unable to safely perform thoracentesis given small volume, loculated fluid.  Yesterday, pt was found to have new development of LUE flaccid paralysis. Code stroke was called, possible distal stroke in R MCA per MD. Workup ongoing.  IR consulted to place R chest tube today due to pt having R empyema.   Pt with poor prognosis per MD. This was discussed with pt's caregiver, but caregiver requests ongoing aggressive care.   Pt continues to have good appetite/intake. Last 7 meals documented as 90-100% meal compeltion (97.8% average meal completion). Pt previously receiving Ensure; however, product was discontinued and replaced with Boost Breeze TID due to lactose intolerance/diarrhea. Pt has been consuming these well per RN.   UOP: x24 hours  Labs: Na 134 (L) Medications: lactaid TID w/ meals, MVI with minerals, IV magnesium sulfate 2g x1, IV potassium  Phosphate in D5 x1  Diet Order:   Diet Order            Diet NPO time specified  Diet effective midnight                 EDUCATION NEEDS:   Not appropriate for education at this time  Skin:  Skin Assessment: Skin Integrity Issues: Skin Integrity Issues:: Stage I,Stage II Stage I: rt proximal hip Stage II: sacrum, rt proximal hip, left lateral back Other: n/a  Last BM:  12/21  Height:   Ht Readings from Last 1 Encounters:  07/07/20 5\' 6"  (1.676 m)    Weight:   Wt Readings from Last 1 Encounters:  07/18/20 45.3 kg    Ideal Body Weight:  61.8 kg  BMI:  Body mass index is 16.12 kg/m.  Estimated Nutritional Needs:   Kcal:  1850-2050  Protein:  90-105 grams  Fluid:  > 1.8 L    Andres Gavia, MS, RD, LDN RD pager number and weekend/on-call pager number located in [place].

## 2020-07-18 NOTE — Procedures (Signed)
Interventional Radiology Procedure:   Indications:  Right pleural effusion and needs IV access  Procedure: 1) PICC line placement 2)  Right thoracentesis  Findings: Left arm brachial PICC, tip at SVC/RA junction.  Length = 40 cm.  Right pleural effusion, loculated in right lower chest but upper chest is not loculated. Placed Yueh catheter into right upper pleural space (very small window).  Unable to advance drain over wire due to angle of the puncture.  Therefore, a right thoracentesis was performed.  Removed 450 ml of red pleural fluid.   Complications: None     EBL: less than 10 ml  Plan: Send fluid for analysis.  Follow up CXR.   Andres [name] R. Lowella Dandy, MD  Pager: [phone]
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
## 2020-07-18 NOTE — Progress Notes (Signed)
IR consulted and hopefully will be able to place right chest tube under guidance.

## 2020-07-18 NOTE — Sedation Documentation (Signed)
Patient transported to xray then back to floor

## 2020-07-18 NOTE — Progress Notes (Signed)
Pharmacy Antibiotic Note  Andres [name] is a 39 y.o. male admitted on 07/01/2020 with multi-lobar pneumonia and E. coli bacteremia. Tmax 99.5 F, WBC 13.2, Scr 0.53, CrCl 78.6 mL/min. The patient is currently on day 7 of total antibiotic therapy and day 4 of cefepime. Upon discussion with the MD, cefepime will be continued for now. Pharmacy has been consulted for cefepime dosing.   Plan: - Continue cefepime 2 g IV q8h - Monitor renal function, clinical status, length of therapy, and cultures - Deescalate therapy as clinically indicated  Height: 5\' 6"  (167.6 cm) Weight: 45.3 kg (99 lb 13.9 oz) IBW/kg (Calculated) : 63.8  Temp (24hrs), Avg:98.9 F (37.2 C), Min:98.3 F (36.8 C), Max:99.3 F (37.4 C)  Recent Labs  Lab 07/14/20 0059 07/15/20 0924 07/16/20 0158 07/17/20 0250 07/18/20 0322  WBC 14.4* 18.3* 16.5* 15.7* 13.2*  CREATININE 0.53* 0.61 0.58* 0.51* 0.53*    Estimated Creatinine Clearance: 78.6 mL/min (A) (by C-G formula based on SCr of 0.53 mg/dL (L)).    Allergies  Allergen Reactions  . Lactose Diarrhea    Antimicrobials this admission: 12/17 ceftriaxone >> 12/20 12/20 cefepime >>   Dose adjustments this admission: None  Microbiology results: 12/22 BCx NGTD 12/20 MRSA PCR negative 12/18 OVA + parasite exam sent 12/17 BCx E coli in 1/4 bottles (pan sensitive) 12/9 GI panel negative 12/8 Ucx E coli (pan sensitive) 12/7 Bcx NGTD  14/7, PharmD, RPh  [id number] Pharmacy Resident 07/18/2020 10:53 AM  Please check [email or web address] for unit-specific pharmacy phone numbers.

## 2020-07-18 NOTE — Consult Note (Signed)
Chief Complaint: Patient was seen in consultation today for right chest tube placement  Chief Complaint  Patient presents with  . Fall   at the request of Dr Tyrone Sage  Referring Physician(s): * No referring provider recorded for this case *  Supervising Physician: Richarda Overlie  Patient Status: [facility] - In-pt  History of Present Illness: Aveon Colquhoun is a 39 y.o. male   Hx Autism; FAS; Sz; Non verbal Admitted after fall at home 07/01/20 Complicated admission with CT revealing Rt lung effusion Thora ordered but no safe window for access 12/21 per Dr Elby Showers  Dr Tyrone Sage has seen pt Rt empyema and NOT surgical candidate Dr Tyrone Sage requesting Rt pigtail chest tube placement  Imaging reviewed with Dr Roderic Scarce attempt can be made for CT into Rt superior site   Past Medical History:  Diagnosis Date  . Autism   . Depression   . Disorder of intellectual development   . GERD (gastroesophageal reflux disease)   . History of seizures     Past Surgical History:  Procedure Laterality Date  . BIOPSY  07/07/2020   Procedure: BIOPSY;  Surgeon: Meryl Dare, MD;  Location: [facility] ENDOSCOPY;  Service: Endoscopy;;  . ESOPHAGOGASTRODUODENOSCOPY N/A 07/07/2020   Procedure: ESOPHAGOGASTRODUODENOSCOPY (EGD);  Surgeon: Meryl Dare, MD;  Location: [facility] ENDOSCOPY;  Service: Endoscopy;  Laterality: N/A;  . HOT HEMOSTASIS N/A 07/07/2020   Procedure: HOT HEMOSTASIS (ARGON PLASMA COAGULATION/BICAP);  Surgeon: Meryl Dare, MD;  Location: [facility] ENDOSCOPY;  Service: Endoscopy;  Laterality: N/A;    Allergies: Lactose  Medications: Prior to Admission medications   Medication Sig Start Date End Date Taking? Authorizing Provider  ARIPiprazole (ABILIFY) 10 MG tablet Take 10 mg by mouth in the morning.    Yes [provider]  Cholecalciferol (VITAMIN D-3) 25 MCG (1000 UT) CAPS Take 1,000 Units by mouth daily.    Yes [provider]  famotidine (PEPCID) 20 MG tablet Take  20 mg by mouth 2 (two) times daily.   Yes [provider]  hydrOXYzine (ATARAX/VISTARIL) 25 MG tablet Take 25 mg by mouth daily as needed (for agitation).  06/10/20  Yes [provider]  traZODone (DESYREL) 100 MG tablet Take 100 mg by mouth at bedtime.    Yes [provider]     Family History  Problem Relation Age of Onset  . Mental illness Mother     Social History   Socioeconomic History  . Marital status: Single    Spouse name: Not on file  . Number of children: Not on file  . Years of education: Not on file  . Highest education level: Not on file  Occupational History  . Not on file  Tobacco Use  . Smoking status: Never Smoker  . Smokeless tobacco: Never Used  Vaping Use  . Vaping Use: Never used  Substance and Sexual Activity  . Alcohol use: Not Currently  . Drug use: Not Currently  . Sexual activity: Not Currently  Other Topics Concern  . Not on file  Social History Narrative  . Not on file   Social Determinants of Health   Financial Resource Strain: Not on file  Food Insecurity: Not on file  Transportation Needs: Not on file  Physical Activity: Not on file  Stress: Not on file  Social Connections: Not on file    Review of Systems: A 12 point ROS discussed and pertinent positives are indicated in the HPI above.  All other systems are negative.  Review  of Systems  Vital Signs: BP 101/61 (BP Location: Left Arm)   Pulse 98   Temp 99.3 F (37.4 C) (Oral)   Resp 14   Ht 5\' 6"  (1.676 m)   Wt 99 lb 13.9 oz (45.3 kg)   SpO2 98%   BMI 16.12 kg/m   Physical Exam Cardiovascular:     Rate and Rhythm: Normal rate and regular rhythm.     Heart sounds: Normal heart sounds.  Pulmonary:     Effort: Pulmonary effort is normal.     Breath sounds: Wheezing present.  Skin:    General: Skin is warm.  Neurological:     Mental Status: Mental status is at baseline.  Psychiatric:     Comments: Unable to consent for self Called  [facility] [employer] [place] consents for procedure via phone     Imaging: EEG  Result Date: 07/04/2020 14/03/2020, MD     07/04/2020  8:36 AM Patient Name: Andres [name] MRN: Francena Hanly Epilepsy Attending: [id number] Referring Physician/Provider: Charlsie Quest, PA Date: 07/03/2020 Duration: 25.15 mins Patient history: 39yo M with ams. EEG to evaluate for seizure Level of alertness: Awake, asleep AEDs during EEG study: None Technical aspects: This EEG study was done with scalp electrodes positioned according to the 10-20 International system of electrode placement. Electrical activity was acquired at a sampling rate of 500Hz  and reviewed with a high frequency filter of 70Hz  and a low frequency filter of 1Hz . EEG data were recorded continuously and digitally stored. Description: No posterior dominant rhythm was noted. Sleep was characterized by vertex waves, sleep spindles (12 to 14 Hz), maximal frontocentral region.  EEG showed continuous generalized low amplitude 3 to 6 Hz theta-delta slowing.  Hyperventilation and photic stimulation were not performed.   ABNORMALITY -Continuous slow, generalized IMPRESSION: This study is suggestive of moderate diffuse encephalopathy, nonspecific etiology. No seizures or epileptiform discharges were seen throughout the recording. 40yo   DG Chest 1 View  Result Date: 07/15/2020 CLINICAL DATA:  Fever. EXAM: CHEST  1 VIEW COMPARISON:  07/12/2020.  CT chest 07/12/2020. FINDINGS: Prominence of the superior mediastinum. This may be related to AP supine portable technique. Upright PA lateral chest x-ray suggested to further evaluate. Cardiomegaly. No pulmonary venous congestion. Density noted in the right mid lung. This could represent a focal infiltrate. Fissural pleural fluid pseudotumor could also present this fashion. Low lung volumes with bibasilar atelectasis. Small right pleural effusion. No pneumothorax. Thoracic spine  scoliosis. Mild gastric distention. IMPRESSION: 1. Prominence of the superior mediastinum. This may be related to AP supine portable technique. Upright PA and lateral chest x-ray suggested to further evaluate. 2. Density noted in the right mid lung. This could represent a focal infiltrate. Fissural pleural fluid pseudotumor could also present in this fashion. Small right pleural effusion. Low lung volumes with bibasilar atelectasis. Small right pleural effusion. 3. Mild gastric distention. Electronically Signed   By: Charlsie Quest  Register   On: 07/15/2020 05:55   DG Chest 1 View  Result Date: 07/12/2020 CLINICAL DATA:  Encounter for fever EXAM: CHEST  1 VIEW COMPARISON:  07/05/2020 FINDINGS: Mild cardiac enlargement. Negative for heart failure. Progression of left lower lobe atelectasis/infiltrate. Mild right lower lobe airspace disease. No significant pleural effusion or edema NG tube removed. IMPRESSION: Progression of left lower lobe atelectasis/infiltrate. Electronically Signed   By: Maisie Fus M.D.   On: 07/12/2020 08:27   DG Abd 1 View  Result Date: 07/16/2020 CLINICAL DATA:  Abdominal  distention. EXAM: ABDOMEN - 1 VIEW COMPARISON:  CT chest 07/15/2020. Chest x-ray 07/15/2020. CT 07/07/2020. Abdomen 07/07/2020. FINDINGS: Persistent gastric distention. Stool noted in colon. No free air. Patchy scratched it bibasilar atelectasis/infiltrates. Right pleural effusion noted. Persistent distended loops of small and large bowel. No free air. Probable pill fragment in the stomach. Thoracolumbar spine scoliosis. IMPRESSION: 1. Persistent gastric distention. Persistent distended loops of small and large bowel. 2. Bibasilar atelectasis/infiltrates. Right pleural effusion. Electronically Signed   By: Maisie Fus  Register   On: 07/16/2020 13:09   CT HEAD WO CONTRAST  Result Date: 07/17/2020 CLINICAL DATA:  Altered mental status. EXAM: CT HEAD WITHOUT CONTRAST TECHNIQUE: Contiguous axial images were obtained from  the base of the skull through the vertex without intravenous contrast. COMPARISON:  None. FINDINGS: Brain: No evidence of acute infarction, hemorrhage, hydrocephalus, extra-axial collection or mass lesion/mass effect. The exam is degraded by patient motion. Vascular: No hyperdense vessel or unexpected calcification. Skull: Intact.  No focal lesion. Sinuses/Orbits: Negative. Other: None. IMPRESSION: No acute abnormality. Electronically Signed   By: Drusilla Kanner M.D.   On: 07/17/2020 10:27   CT Head Wo Contrast  Result Date: 07/01/2020 CLINICAL DATA:  Larey Seat in shower EXAM: CT HEAD WITHOUT CONTRAST TECHNIQUE: Contiguous axial images were obtained from the base of the skull through the vertex without intravenous contrast. COMPARISON:  None. FINDINGS: Brain: No acute territorial infarction, hemorrhage, or intracranial mass. The ventricles are nonenlarged. Vascular: No hyperdense vessels.  No unexpected calcification Skull: No fracture Sinuses/Orbits: No acute finding. Other: Generalized subcutaneous and soft tissue edema within the scalp and suboccipital soft tissues. IMPRESSION: 1. No CT evidence for acute intracranial abnormality. Electronically Signed   By: Jasmine Pang M.D.   On: 07/01/2020 23:28   CT CHEST W CONTRAST  Result Date: 07/15/2020 CLINICAL DATA:  Follow-up multifocal pneumonia EXAM: CT CHEST WITH CONTRAST TECHNIQUE: Multidetector CT imaging of the chest was performed during intravenous contrast administration. CONTRAST:  75mL OMNIPAQUE IOHEXOL 300 MG/ML  SOLN COMPARISON:  CT from 07/12/2020 FINDINGS: Cardiovascular: Thoracic aorta shows no aneurysmal dilatation or dissection. Heart is mildly enlarged in size. Pulmonary artery shows no large central embolus although timing was not performed for embolus evaluation. Mediastinum/Nodes: Thoracic inlet is within normal limits. No hilar or mediastinal adenopathy is noted. The esophagus as visualized is within normal limits. Lungs/Pleura: The lungs  again demonstrate bilateral lower lobe infiltrates with associated effusions the degree of fluid on the right has increased with some apparent loculation within the major fissure. Some increase in the degree of consolidation in the right upper lobe as occurred as well related to the effusions. Upper Abdomen: No acute abnormality noted. Musculoskeletal: No chest wall abnormality. No acute or significant osseous findings. IMPRESSION: Increase in right-sided pleural effusion and upper lobe consolidation. The remainder of the exam is stable from 3 days previous. Electronically Signed   By: Alcide Clever M.D.   On: 07/15/2020 14:25   CT ANGIO CHEST PE W OR WO CONTRAST  Result Date: 07/12/2020 CLINICAL DATA:  High probability of pulmonary embolus. EXAM: CT ANGIOGRAPHY CHEST WITH CONTRAST TECHNIQUE: Multidetector CT imaging of the chest was performed using the standard protocol during bolus administration of intravenous contrast. Multiplanar CT image reconstructions and MIPs were obtained to evaluate the vascular anatomy. CONTRAST:  61mL OMNIPAQUE IOHEXOL 350 MG/ML SOLN COMPARISON:  None. FINDINGS: Cardiovascular: Satisfactory opacification of the pulmonary arteries to the segmental level. No evidence of pulmonary embolism. Normal heart size. No pericardial effusion. Mediastinum/Nodes: No enlarged mediastinal, hilar,  or axillary lymph nodes. Thyroid gland, trachea, and esophagus demonstrate no significant findings. Lungs/Pleura: No pneumothorax is noted. Moderate loculated right pleural effusion is noted with associated airspace opacities in the right upper and lower lobes concerning for pneumonia. Small left pleural effusion is noted with adjacent subsegmental atelectasis. Upper Abdomen: No acute abnormality. Musculoskeletal: No chest wall abnormality. No acute or significant osseous findings. Review of the MIP images confirms the above findings. IMPRESSION: 1. No definite evidence of pulmonary embolus. 2. Moderate  loculated right pleural effusion is noted with associated airspace opacities in the right upper and lower lobes concerning for pneumonia. 3. Small left pleural effusion is noted with adjacent subsegmental atelectasis. Electronically Signed   By: Lupita Raider M.D.   On: 07/12/2020 14:57   CT Cervical Spine Wo Contrast  Result Date: 07/01/2020 CLINICAL DATA:  Larey Seat in shower EXAM: CT CERVICAL SPINE WITHOUT CONTRAST TECHNIQUE: Multidetector CT imaging of the cervical spine was performed without intravenous contrast. Multiplanar CT image reconstructions were also generated. COMPARISON:  None. FINDINGS: Alignment: Reversal of cervical lordosis. 3 mm anterolisthesis C3 on C4, potentially due to posterior facet degenerative change. Skull base and vertebrae: No acute fracture. No primary bone lesion or focal pathologic process. Soft tissues and spinal canal: No prevertebral fluid or swelling. No visible canal hematoma. Disc levels: Mild diffuse degenerative changes at C4-C5, C5-C6 and C6-C7. Facet degeneration at multiple levels, most notable at C3-C4. Upper chest: Negative. Other: Poorly defined soft tissue planes, likely due to absence of significant subcutaneous fat. IMPRESSION: Reversal of cervical lordosis with 3 mm anterolisthesis C3 on C4, potentially due to posterior facet degenerative change. No fracture identified. Electronically Signed   By: Jasmine Pang M.D.   On: 07/01/2020 23:34   MR BRAIN WO CONTRAST  Result Date: 07/03/2020 CLINICAL DATA:  Mental status changes of unknown cause. EXAM: MRI HEAD WITHOUT CONTRAST TECHNIQUE: Multiplanar, multiecho pulse sequences of the brain and surrounding structures were obtained without intravenous contrast. COMPARISON:  Head CT 07/01/2020 FINDINGS: Brain: The patient was scanned in the decubitus position. Diffusion imaging does not show any acute or subacute infarction or other cause of restricted diffusion. The brainstem and cerebellum are normal. Cerebral  hemispheres are normal without evidence of old or acute infarction, mass lesion, hemorrhage, hydrocephalus or extra-axial collection. Vascular: Major vessels at the base of the brain show flow. Skull and upper cervical spine: Negative Sinuses/Orbits: Clear/normal Other: None IMPRESSION: Normal examination. No abnormality seen to explain the clinical presentation. Electronically Signed   By: Paulina Fusi M.D.   On: 07/03/2020 14:45   CT ABDOMEN PELVIS W CONTRAST  Result Date: 07/07/2020 CLINICAL DATA:  Concern for bowel obstruction. EXAM: CT ABDOMEN AND PELVIS WITH CONTRAST TECHNIQUE: Multidetector CT imaging of the abdomen and pelvis was performed using the standard protocol following bolus administration of intravenous contrast. CONTRAST:  OMNIPAQUE IOHEXOL 300 MG/ML  SOLN COMPARISON:  07/04/2020 FINDINGS: Examination is degraded secondary to patient's inability to tolerate standard positioning, overlying upper extremity as well as cachectic state. Lower chest: Limited evaluation of the lower thorax demonstrates trace bilateral effusions with associated bibasilar consolidative opacities, likely atelectasis. Question 9 mm right lower lobe pulmonary nodule is not seen on the present examination and thus may have represented nodular atelectasis. Borderline cardiomegaly. No pericardial effusion. Hepatobiliary: Normal hepatic contour. No discrete hepatic lesions. Normal appearance of the gallbladder given degree distention. No radiopaque gallstones. No intra or extrahepatic biliary duct dilatation. Trace amount of perihepatic fluid/ascites. Pancreas: Appears normal. Spleen: Appears  normal. Adrenals/Urinary Tract: There is symmetric enhancement and excretion of the bilateral kidneys. Note is made of an approximately 3.1 cm hypoattenuating nonenhancing left-sided renal cyst which contains a thin internal septation. Punctate subcentimeter right-sided renal lesion is too small to accurately characterize though  favored to represent additional renal cysts. No urinary obstruction or perinephric stranding. Normal appearance the bilateral adrenal glands. The urinary bladder appears under distended and thus suboptimally evaluated. Stomach/Bowel: Evaluation of the intestines is degraded secondary to lack of any significant intra-abdominal fat. Ingested enteric contrast now seen extending to the level of the rectum. Previous noted gaseous distension of the large and small bowel is improved compared to the 07/04/2020 examination though there is persistent patulous distension of several scattered loops of small bowel with index loop of small bowel within the midline of the lower abdomen/pelvis measuring approximately 4.1 cm in diameter (image 61, series 7). Foci of air within the caudal posterior aspect of the right peritoneal cavity is favored to represent air within the appendix though again evaluation degraded secondary to lack of mesenteric fat (image 51, series 3; coronal image 66, series 7). No pneumoperitoneum, pneumatosis or portal venous gas. No definitive definable/drainable fluid collection within the abdomen or pelvis. Vascular/Lymphatic: There is an approximately 2.3 x 2.1 x 1.6 cm enhancing nodule within the midline of the upper abdomen (coronal image 45, series 7; axial image 10, series 3), which appears extrinsic to the adjacent stomach as well as separate from the hepatic parenchyma and heart and thus is indeterminate differential considerations including an aneurysm arising from the left gastric artery versus a small splenule. This structure is unchanged in hind site compared to the 07/04/2020 examination. Normal caliber of the abdominal aorta. The major branch vessels of the abdominal aorta appear patent on this non CTA examination. No bulky retroperitoneal, mesenteric, pelvic or inguinal lymphadenopathy on this body habitus degraded examination. Reproductive: Suboptimally evaluated due to cachectic state. Other:  Diffuse cachexia. Musculoskeletal: No definite acute or aggressive osseous abnormalities. Mild degenerative change the left hip with joint space loss, subchondral sclerosis and osteophytosis. Mild scoliotic curvature of the thoracolumbar spine, potentially positional. IMPRESSION: 1. Degraded examination secondary to patient's inability to tolerate positioning for the CT scan as well as lack of mesenteric fat and cachectic state. 2. Improved bowel gas pattern with persistent patulous distension of several scattered loops of small bowel without evidence of enteric obstruction, nonspecific though findings suggestive of improved ileus/partial obstruction. 3. Indeterminate approximately 2.3 cm enhancing nodule within the midline of the upper abdomen of uncertain etiology with differential considerations including an aneurysm arising from the left gastric artery versus a small splenule. Correlation with previous outside examinations (if available), is advised. While of uncertain clinical significance, further evaluation with nonemergent multiphase CTA could be performed as indicated. 4. Trace bilateral effusions with associated bibasilar opacities, likely atelectasis. Electronically Signed   By: Simonne ComeJohn  Watts M.D.   On: 07/07/2020 07:52   CT ABDOMEN PELVIS W CONTRAST  Addendum Date: 07/05/2020   ADDENDUM REPORT: 07/05/2020 00:09 ADDENDUM: These results were called by telephone at the time of interpretation on 07/05/2020 at 12:07 am to provider Dr. Barbaraann FasterSteen, who verbally acknowledged these results. Electronically Signed   By: Tish FredericksonMorgane  Naveau M.D.   On: 07/05/2020 00:09   Result Date: 07/05/2020 CLINICAL DATA:  diarrhea, unwitnessed fall in the shower and change in his baseline following in that he was slumped over, drooling, and not as responsive EXAM: CT ABDOMEN AND PELVIS WITH CONTRAST TECHNIQUE: Multidetector CT imaging  of the abdomen and pelvis was performed using the standard protocol following bolus administration  of intravenous contrast. CONTRAST:  OMNIPAQUE IOHEXOL 300 MG/ML  SOLN COMPARISON:  None. FINDINGS: Lower chest: Fluid noted within the esophageal lumen. Right lower lobe subsegmental atelectasis. A 0.9 cm nodular-like density within the right lower lobe. There is a 0.6 cm pulmonary nodule slightly more superiorly. Hepatobiliary: No focal liver abnormality. No gallstones, gallbladder wall thickening, or pericholecystic fluid. No biliary dilatation. Pancreas: No focal lesion. Normal pancreatic contour. No surrounding inflammatory changes. No main pancreatic ductal dilatation. Spleen: Normal in size without focal abnormality. Adrenals/Urinary Tract: Adrenal glands are not well visualized. Bilateral kidneys enhance symmetrically. Subcentimeter hypodensities are too small to characterize. There is a 2.9 cm fluid density lesion within the left kidney that likely represents a simple cyst. No hydronephrosis. No hydroureter. The urinary bladder is unremarkable. Stomach/Bowel: [employer] versus ingested material within the gastric lumen. The small bowel is distended with fluid measuring up to 4 cm. The large bowel is distended with gas and fluid. No large or small bowel wall thickening. No pneumatosis. The appendix is not visualized. Vascular/Lymphatic: No abdominal aorta or iliac aneurysm. Nonspecific mild swirling within the small bowel mesentery (9:59-69) no abdominal, pelvic, or inguinal lymphadenopathy. Reproductive: Prostate is unremarkable. Other: No intraperitoneal free fluid. No intraperitoneal free gas. No organized fluid collection. Musculoskeletal: No abdominal wall hernia or abnormality No suspicious lytic or blastic osseous lesions. No acute displaced fracture. IMPRESSION: 1. Findings concerning for a bowel obstruction versus ileus. No findings suggest colitis or bowel perforation. Fluid noted within the esophageal lumen. Consider serial abdominal X-rays after PO contrast ingestion as well as  enteric tube placement. 2. Findings consistent with known diarrheal state. 3. Nodular like 0.9 cm right lower lobe pulmonary nodule. Non-contrast chest CT at 3-6 months is recommended. If the nodules are stable at time of repeat CT, then future CT at 18-24 months (from today's scan) is considered optional for low-risk patients, but is recommended for high-risk patients. This recommendation follows the consensus statement: Guidelines for Management of Incidental Pulmonary Nodules Detected on CT Images: From the Fleischner Society 2017; Radiology 2017; 284:228-243. 4. No findings to suggest acute traumatic injury. Electronically Signed: By: Tish Frederickson M.D. On: 07/04/2020 23:55   DG Pelvis Portable  Result Date: 07/01/2020 CLINICAL DATA:  Status post fall. EXAM: PORTABLE PELVIS 1-2 VIEWS COMPARISON:  None. FINDINGS: There is no evidence of pelvic fracture or diastasis. No pelvic bone lesions are seen. Mild degenerative changes are seen involving both hips, in the form of joint space narrowing and acetabular sclerosis. IMPRESSION: No acute osseous abnormality. Electronically Signed   By: Aram Candela M.D.   On: 07/01/2020 23:49   DG CHEST PORT 1 VIEW  Result Date: 07/05/2020 CLINICAL DATA:  NG tube. EXAM: PORTABLE CHEST 1 VIEW COMPARISON:  Abdomen 07/05/2020 CT 07/04/2020. Chest x-ray 07/01/2020. FINDINGS: NG tube noted with tip over the stomach. Contrast noted within the NG tube. Gastric and bowel distention again noted. Minimal infiltrate left mid lung cannot be completely excluded. No pleural effusion or pneumothorax. Heart size stable. Thoracic spine scoliosis and degenerative change. IMPRESSION: 1. NG tube noted with tip over the stomach. Gastric and bowel distention again noted. 2. Minimal infiltrate left mid lung cannot be completely excluded. Electronically Signed   By: Maisie Fus  Register   On: 07/05/2020 07:15   DG Chest Portable 1 View  Result Date: 07/01/2020 CLINICAL DATA:  Status post  fall. EXAM: PORTABLE CHEST 1 VIEW  COMPARISON:  None. FINDINGS: The heart size and mediastinal contours are within normal limits. Both lungs are clear. The visualized skeletal structures are unremarkable. IMPRESSION: No active disease. Electronically Signed   By: Aram Candela M.D.   On: 07/01/2020 23:50   DG Abd Portable 1V  Result Date: 07/07/2020 CLINICAL DATA:  Hypotension EXAM: PORTABLE ABDOMEN - 1 VIEW COMPARISON:  07/05/2020 FINDINGS: Gaseous distension of bowel diffusely, likely ileus. No free air organomegaly. No suspicious calcification. IMPRESSION: Diffuse gaseous distention of bowel, worsening since prior study, likely ileus. Electronically Signed   By: Charlett Nose M.D.   On: 07/07/2020 01:40   DG Abd Portable 1V-Small Bowel Obstruction Protocol-initial, 8 hr delay  Result Date: 07/05/2020 CLINICAL DATA:  Encounter for small bowel obstruction. 8 hour delay. EXAM: PORTABLE ABDOMEN - 1 VIEW COMPARISON:  Prior abdomen 07/05/2020.  CT 07/04/2020. FINDINGS: NG tube noted in the stomach. Gastric and small bowel distention noted. Oral contrast is noted throughout the colon and rectum. No free air identified. IMPRESSION: Gastric and small bowel distention noted. Oral contrast is noted throughout the colon and rectum. Electronically Signed   By: Maisie Fus  Register   On: 07/05/2020 11:51   DG Abd Portable 1V-Small Bowel Protocol-Position Verification  Result Date: 07/05/2020 CLINICAL DATA:  NG tube placement EXAM: PORTABLE ABDOMEN - 1 VIEW COMPARISON:  None. FINDINGS: Tip of the NG tube is seen within the distal stomach. Mildly dilated air-filled loops of bowel and gastric bubble is seen throughout. No radio-opaque calculi or other significant radiographic abnormality are seen. IMPRESSION: Tip the NG tube within the distal stomach. Electronically Signed   By: Jonna Clark M.D.   On: 07/05/2020 03:18   ECHOCARDIOGRAM COMPLETE  Result Date: 07/11/2020    ECHOCARDIOGRAM REPORT   Patient Name:    Andres [name] Date of Exam: 07/11/2020 Medical Rec #:  [id number]       Height:       66.0 in Accession #:    [id number]      Weight:       103.2 lb Date of Birth:  February 23, 1981       BSA:          1.509 m Patient Age:    39 years        BP:           109/57 mmHg Patient Gender: M               HR:           101 bpm. Exam Location:  Inpatient Procedure: 2D Echo, Color Doppler and Cardiac Doppler Indications:    Ventricular Tachycardia I47.2  History:        Patient has no prior history of Echocardiogram examinations.  Sonographer:    Eulah Pont RDCS Referring Phys: [id number] Reymundo Poll IMPRESSIONS  1. Left ventricular ejection fraction, by estimation, is 55 to 60%. The left ventricle has normal function. The left ventricle has no regional wall motion abnormalities. Left ventricular diastolic parameters are indeterminate.  2. Right ventricular systolic function is normal. The right ventricular size is normal. Tricuspid regurgitation signal is inadequate for assessing PA pressure.  3. A small pericardial effusion is present.  4. The mitral valve is normal in structure. Mild mitral valve regurgitation. No evidence of mitral stenosis.  5. The aortic valve is tricuspid. Aortic valve regurgitation is not visualized. No aortic stenosis is present. FINDINGS  Left Ventricle: Left ventricular ejection fraction, by estimation, is 55 to 60%. The left ventricle  has normal function. The left ventricle has no regional wall motion abnormalities. The left ventricular internal cavity size was normal in size. There is  no left ventricular hypertrophy. Left ventricular diastolic parameters are indeterminate. Right Ventricle: The right ventricular size is normal. Right ventricular systolic function is normal. Tricuspid regurgitation signal is inadequate for assessing PA pressure. The tricuspid regurgitant velocity is 2.70 m/s, and with an assumed right atrial  pressure of 3 mmHg, the estimated right ventricular systolic pressure  is 32.2 mmHg. Left Atrium: Left atrial size was normal in size. Right Atrium: Right atrial size was normal in size. Pericardium: A small pericardial effusion is present. Mitral Valve: The mitral valve is normal in structure. Mild mitral valve regurgitation. No evidence of mitral valve stenosis. Tricuspid Valve: The tricuspid valve is normal in structure. Tricuspid valve regurgitation is mild . No evidence of tricuspid stenosis. Aortic Valve: The aortic valve is tricuspid. Aortic valve regurgitation is not visualized. No aortic stenosis is present. Pulmonic Valve: The pulmonic valve was normal in structure. Pulmonic valve regurgitation is not visualized. No evidence of pulmonic stenosis. Aorta: The aortic root is normal in size and structure. Venous: The inferior vena cava was not well visualized. IAS/Shunts: No atrial level shunt detected by color flow Doppler.  LEFT VENTRICLE PLAX 2D LVIDd:         4.50 cm  Diastology LVIDs:         2.60 cm  LV e' medial:    7.07 cm/s LV PW:         0.60 cm  LV E/e' medial:  9.6 LV IVS:        0.60 cm  LV e' lateral:   8.92 cm/s LVOT diam:     1.90 cm  LV E/e' lateral: 7.6 LV SV:         48 LV SV Index:   32 LVOT Area:     2.84 cm  RIGHT VENTRICLE RV S prime:     19.40 cm/s TAPSE (M-mode): 2.2 cm LEFT ATRIUM           Index       RIGHT ATRIUM           Index LA diam:      2.00 cm 1.32 cm/m  RA Area:     12.50 cm LA Vol (A2C): 13.2 ml 8.74 ml/m  RA Volume:   31.20 ml  20.67 ml/m LA Vol (A4C): 15.6 ml 10.33 ml/m  AORTIC VALVE LVOT Vmax:   112.00 cm/s LVOT Vmean:  73.000 cm/s LVOT VTI:    0.169 m  AORTA Ao Root diam: 3.10 cm MITRAL VALVE               TRICUSPID VALVE MV Area (PHT): 3.46 cm    TR Peak grad:   29.2 mmHg MV Decel Time: 219 msec    TR Vmax:        270.00 cm/s MV E velocity: 68.20 cm/s MV A velocity: 54.80 cm/s  SHUNTS MV E/A ratio:  1.24        Systemic VTI:  0.17 m                            Systemic Diam: 1.90 cm Olga Millers MD Electronically signed by Olga Millers MD Signature Date/Time: 07/11/2020/12:46:24 PM    Final    IR US CHEST  Result Date: 07/16/2020 CLINICAL DATA:  39 year old male with history of multifocal pneumonia  and right pleural effusion. EXAM: CHEST ULTRASOUND COMPARISON:  Chest CT from 07/15/2020 FINDINGS: Small right pleural fluid noted, mostly complex. No safe window for thoracentesis. IMPRESSION: Complex, small right pleural effusion. No safe window for thoracentesis at this time. Marliss Coots, MD [facility] Electronically Signed   By: Marliss Coots MD   On: 07/16/2020 13:20   US Abdomen Limited RUQ (LIVER/GB)  Result Date: 07/03/2020 CLINICAL DATA:  Transaminitis EXAM: ULTRASOUND ABDOMEN LIMITED RIGHT UPPER QUADRANT COMPARISON:  None. FINDINGS: Gallbladder: 5 mm gallbladder wall polyp. No mobile stones, wall thickening or sonographic Murphy sign. Common bile duct: Diameter: Normal caliber, 2 mm. Liver: No focal lesion identified. Within normal limits in parenchymal echogenicity. Portal vein is patent on color Doppler imaging with normal direction of blood flow towards the liver. Other: None. IMPRESSION: Small gallbladder wall polyp. No acute findings. Electronically Signed   By: Charlett Nose M.D.   On: 07/03/2020 11:46    Labs:  CBC: Recent Labs    07/15/20 0924 07/16/20 0158 07/17/20 0250 07/18/20 0322  WBC 18.3* 16.5* 15.7* 13.2*  HGB 7.2* 8.0* 8.4* 7.1*  HCT 21.9* 25.2* 24.5* 20.5*  PLT 294 340 370 412*    COAGS: Recent Labs    07/12/20 0819 07/14/20 0059 07/17/20 0946 07/18/20 0322  INR 1.2 1.2 1.3* 1.2  APTT 39*  --  41*  --     BMP: Recent Labs    07/15/20 0924 07/16/20 0158 07/17/20 0250 07/18/20 0322  NA 137 138 137 134*  K 3.7 3.9 3.8 3.5  CL 102 102 101 100  CO2 27 28 27 27   GLUCOSE 123* 105* 119* 118*  BUN 16 12 11 11   CALCIUM 7.3* 7.4* 7.7* 7.5*  CREATININE 0.61 0.58* 0.51* 0.53*  GFRNONAA >60 >60 >60 >60    LIVER  FUNCTION TESTS: Recent Labs    07/15/20 0924 07/16/20 0158 07/17/20 0250 07/18/20 0322  BILITOT 0.4 0.4 0.6 0.4  AST 68* 86* 81* 70*  ALT 61* 69* 81* 78*  ALKPHOS 59 64 73 68  PROT 4.5* 4.5* 5.2* 4.9*  ALBUMIN 1.2* 1.1* 1.3* 1.1*    TUMOR MARKERS: No results for input(s): AFPTM, CEA, CA199, CHROMGRNA in the last 8760 hours.  Assessment and Plan:  Rt empyema Scheduled for Rt pigtail chest rube placement Risks and benefits discussed with Guard[employer]hip [employer] [place] via phone including bleeding, infection, damage to adjacent structures, and sepsis.  All questions were answered, 07/19/20 Guardian consents to procedure via phone and is  agreeable to proceed. Consent signed and in chart.   Thank you for this interesting consult.  I greatly enjoyed meeting Kinser Fellman and look forward to participating in their care.  A copy of this report was sent to the requesting provider on this date.  Electronically Signed: Lossie Faes, PA-C 07/18/2020, 8:47 AM   I spent a total of 40 Minutes    in face to face in clinical consultation, greater than 50% of which was counseling/coordinating care for right pigtail chest tube placement

## 2020-07-19 LAB — CBC
HCT: 22.1 % — ABNORMAL LOW (ref 39.0–52.0)
Hemoglobin: 7.3 g/dL — ABNORMAL LOW (ref 13.0–17.0)
MCH: 30.2 pg (ref 26.0–34.0)
MCHC: 33 g/dL (ref 30.0–36.0)
MCV: 91.3 fL (ref 80.0–100.0)
Platelets: 429 10*3/uL — ABNORMAL HIGH (ref 150–400)
RBC: 2.42 MIL/uL — ABNORMAL LOW (ref 4.22–5.81)
RDW: 15 % (ref 11.5–15.5)
WBC: 17.6 10*3/uL — ABNORMAL HIGH (ref 4.0–10.5)
nRBC: 0 % (ref 0.0–0.2)

## 2020-07-19 LAB — COMPREHENSIVE METABOLIC PANEL
ALT: 92 U/L — ABNORMAL HIGH (ref 0–44)
AST: 114 U/L — ABNORMAL HIGH (ref 15–41)
Albumin: 1.1 g/dL — ABNORMAL LOW (ref 3.5–5.0)
Alkaline Phosphatase: 65 U/L (ref 38–126)
Anion gap: 6 (ref 5–15)
BUN: 12 mg/dL (ref 6–20)
CO2: 29 mmol/L (ref 22–32)
Calcium: 7.7 mg/dL — ABNORMAL LOW (ref 8.9–10.3)
Chloride: 99 mmol/L (ref 98–111)
Creatinine, Ser: 0.61 mg/dL (ref 0.61–1.24)
GFR, Estimated: >60 mL/min (ref >60)
Glucose, Bld: 179 mg/dL — ABNORMAL HIGH (ref 70–99)
Potassium: 3.3 mmol/L — ABNORMAL LOW (ref 3.5–5.1)
Sodium: 134 mmol/L — ABNORMAL LOW (ref 135–145)
Total Bilirubin: 0.6 mg/dL (ref 0.3–1.2)
Total Protein: 4.7 g/dL — ABNORMAL LOW (ref 6.5–8.1)

## 2020-07-19 LAB — CBC WITH DIFFERENTIAL/PLATELET
Abs Immature Granulocytes: 0.26 10*3/uL — ABNORMAL HIGH (ref 0.00–0.07)
Basophils Absolute: 0 10*3/uL (ref 0.0–0.1)
Basophils Relative: 0 %
Eosinophils Absolute: 0.1 10*3/uL (ref 0.0–0.5)
Eosinophils Relative: 0 %
HCT: 19.3 % — ABNORMAL LOW (ref 39.0–52.0)
Hemoglobin: 6.4 g/dL — CL (ref 13.0–17.0)
Immature Granulocytes: 2 %
Lymphocytes Relative: 9 %
Lymphs Abs: 1.3 10*3/uL (ref 0.7–4.0)
MCH: 31.4 pg (ref 26.0–34.0)
MCHC: 33.2 g/dL (ref 30.0–36.0)
MCV: 94.6 fL (ref 80.0–100.0)
Monocytes Absolute: 1.2 10*3/uL — ABNORMAL HIGH (ref 0.1–1.0)
Monocytes Relative: 8 %
Neutro Abs: 12.1 10*3/uL — ABNORMAL HIGH (ref 1.7–7.7)
Neutrophils Relative %: 81 %
Platelets: 442 10*3/uL — ABNORMAL HIGH (ref 150–400)
RBC: 2.04 MIL/uL — ABNORMAL LOW (ref 4.22–5.81)
RDW: 13.4 % (ref 11.5–15.5)
WBC: 14.9 10*3/uL — ABNORMAL HIGH (ref 4.0–10.5)
nRBC: 0 % (ref 0.0–0.2)

## 2020-07-19 LAB — PREPARE RBC (CROSSMATCH)

## 2020-07-19 LAB — PHOSPHORUS: Phosphorus: 2.3 mg/dL — ABNORMAL LOW (ref 2.5–4.6)

## 2020-07-19 LAB — MAGNESIUM: Magnesium: 1.9 mg/dL (ref 1.7–2.4)

## 2020-07-19 MED ORDER — POTASSIUM PHOSPHATES 15 MMOLE/5ML IV SOLN
20.0000 mmol | Freq: Once | INTRAVENOUS | Status: AC
  Administered 2020-07-19: 11:00:00 20 mmol via INTRAVENOUS
  Filled 2020-07-19 (×2): qty 6.67

## 2020-07-19 MED ORDER — ALTEPLASE 2 MG IJ SOLR
2.0000 mg | Freq: Once | INTRAMUSCULAR | Status: AC
  Administered 2020-07-19: 08:00:00 2 mg
  Filled 2020-07-19 (×2): qty 2

## 2020-07-19 MED ORDER — POTASSIUM CHLORIDE CRYS ER 20 MEQ PO TBCR
40.0000 meq | EXTENDED_RELEASE_TABLET | Freq: Two times a day (BID) | ORAL | Status: DC
  Administered 2020-07-19 (×2): 40 meq via ORAL
  Filled 2020-07-19 (×2): qty 2

## 2020-07-19 MED ORDER — SODIUM CHLORIDE 0.9% IV SOLUTION: Freq: Once | INTRAVENOUS | Status: AC

## 2020-07-19 NOTE — Progress Notes (Signed)
Blood transfusion started at 0535, following MD orders, no reactions noted after 15 min of monitoring and V/S stable, will continue to monitor.   07/19/20 0533  Vitals  Vital Signs Type (Include Temp, Pulse, RR, and B/P) Pre-blood (within 30 minutes)  Temp 99.7 F (37.6 C)  Temp Source Oral  Pulse Rate (!) 110  Resp 20  BP 104/61  Oxygen Therapy  SpO2 97 %  O2 Device Room Air
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
## 2020-07-19 NOTE — Progress Notes (Signed)
MD at  Bedside bed for pt assessment, will continue to monitor

## 2020-07-19 NOTE — Progress Notes (Signed)
Received a call from the lab of critical H &H of 6.4, paged MD on call, awaiting feedback.

## 2020-07-19 NOTE — Progress Notes (Signed)
TPA unavailable in Pyxis at this time. Pharmacy to tube it to the unit

## 2020-07-19 NOTE — Progress Notes (Addendum)
Notified by RN for hemoglobin of 6.4 this morning.  RN did not receive any reports of melena or bloody bowel movements.  Patient underwent thoracentesis for right pleural effusion yesterday and had 450 cc of red pleural fluid.  When evaluated bedside, patient is sleeping comfortably and awake to verbal stimulation.  He could not provide answers for any questions.   Vital signs BP 104/62, Pulse 108, O2 sat 94% on room air, temp 100.9  Physical exam General: Appears comfortable and in no acute distress Pulmonary: Lung sound appreciated on the right side Cardio: Tachycardic, regular rhythm  Assessment and plan Patient has history of upper GI bleed secondary to esophagitis and angiodysplasia.  His hemoglobin has been in the 7-8 range in the last few days.  Hemoglobin yesterday was 7.1.  No reports of melena or bloody bowel movements per RN.  Patient underwent thoracentesis yesterday for right pleural effusion.  They collected 450 cc of red pleural fluid.  Exudative per lights criteria.  Source of bleeding is likely related to this pleural effusion.  Pending pleural fluid analysis.  Will give 1 unit of pRBC now.  His legal guardian has signed the consent form for blood transfusion.   Patient also had a temperature of 100.9.  Patient has right consolidative pneumonia and E. coli bacteremia.  Blood culture collected on 12/22 shows no growth to date.  Patient is on day 4 of cefepime.  Will monitor fever curve. -Transfuse 1 unit of pRBC.  Check posttransfusion H&H -Continue cefepime -Monitor vital signs and respiratory status closely

## 2020-07-20 DIAGNOSIS — R7881 Bacteremia: Secondary | ICD-10-CM

## 2020-07-20 DIAGNOSIS — J869 Pyothorax without fistula: Secondary | ICD-10-CM

## 2020-07-20 LAB — TYPE AND SCREEN
ABO/RH(D): A NEG
Antibody Screen: NEGATIVE
Unit division: 0

## 2020-07-20 LAB — PROTIME-INR
INR: 1.4 — ABNORMAL HIGH (ref 0.8–1.2)
Prothrombin Time: 16.9 seconds — ABNORMAL HIGH (ref 11.4–15.2)

## 2020-07-20 LAB — COMPREHENSIVE METABOLIC PANEL
ALT: 112 U/L — ABNORMAL HIGH (ref 0–44)
AST: 155 U/L — ABNORMAL HIGH (ref 15–41)
Albumin: 1.1 g/dL — ABNORMAL LOW (ref 3.5–5.0)
Alkaline Phosphatase: 67 U/L (ref 38–126)
Anion gap: 6 (ref 5–15)
BUN: 13 mg/dL (ref 6–20)
CO2: 27 mmol/L (ref 22–32)
Calcium: 7.5 mg/dL — ABNORMAL LOW (ref 8.9–10.3)
Chloride: 101 mmol/L (ref 98–111)
Creatinine, Ser: 0.46 mg/dL — ABNORMAL LOW (ref 0.61–1.24)
GFR, Estimated: >60 mL/min (ref >60)
Glucose, Bld: 99 mg/dL (ref 70–99)
Potassium: 4.1 mmol/L (ref 3.5–5.1)
Sodium: 134 mmol/L — ABNORMAL LOW (ref 135–145)
Total Bilirubin: 0.5 mg/dL (ref 0.3–1.2)
Total Protein: 4.7 g/dL — ABNORMAL LOW (ref 6.5–8.1)

## 2020-07-20 LAB — CBC WITH DIFFERENTIAL/PLATELET
Abs Immature Granulocytes: 0.23 10*3/uL — ABNORMAL HIGH (ref 0.00–0.07)
Basophils Absolute: 0 10*3/uL (ref 0.0–0.1)
Basophils Relative: 0 %
Eosinophils Absolute: 0 10*3/uL (ref 0.0–0.5)
Eosinophils Relative: 0 %
HCT: 22.4 % — ABNORMAL LOW (ref 39.0–52.0)
Hemoglobin: 7.3 g/dL — ABNORMAL LOW (ref 13.0–17.0)
Immature Granulocytes: 1 %
Lymphocytes Relative: 7 %
Lymphs Abs: 1.2 10*3/uL (ref 0.7–4.0)
MCH: 30.3 pg (ref 26.0–34.0)
MCHC: 32.6 g/dL (ref 30.0–36.0)
MCV: 92.9 fL (ref 80.0–100.0)
Monocytes Absolute: 1.4 10*3/uL — ABNORMAL HIGH (ref 0.1–1.0)
Monocytes Relative: 8 %
Neutro Abs: 14.2 10*3/uL — ABNORMAL HIGH (ref 1.7–7.7)
Neutrophils Relative %: 84 %
Platelets: 429 10*3/uL — ABNORMAL HIGH (ref 150–400)
RBC: 2.41 MIL/uL — ABNORMAL LOW (ref 4.22–5.81)
RDW: 14.8 % (ref 11.5–15.5)
WBC: 17.1 10*3/uL — ABNORMAL HIGH (ref 4.0–10.5)
nRBC: 0 % (ref 0.0–0.2)

## 2020-07-20 LAB — BPAM RBC
Blood Product Expiration Date: 202201052359
ISSUE DATE / TIME: 202112240526
Unit Type and Rh: 600

## 2020-07-20 LAB — PHOSPHORUS: Phosphorus: 2.3 mg/dL — ABNORMAL LOW (ref 2.5–4.6)

## 2020-07-20 LAB — MAGNESIUM: Magnesium: 1.7 mg/dL (ref 1.7–2.4)

## 2020-07-20 MED ORDER — MAGNESIUM SULFATE 4 GM/100ML IV SOLN
4.0000 g | Freq: Once | INTRAVENOUS | Status: AC
  Administered 2020-07-20: 07:00:00 4 g via INTRAVENOUS
  Filled 2020-07-20: qty 100

## 2020-07-20 MED ORDER — THIAMINE HCL 100 MG PO TABS
100.0000 mg | ORAL_TABLET | Freq: Every day | ORAL | Status: DC
  Administered 2020-07-20 – 2020-08-07 (×19): 100 mg via ORAL
  Filled 2020-07-20 (×19): qty 1

## 2020-07-20 MED ORDER — POTASSIUM PHOSPHATES 15 MMOLE/5ML IV SOLN
30.0000 mmol | Freq: Once | INTRAVENOUS | Status: AC
  Administered 2020-07-20: 09:00:00 30 mmol via INTRAVENOUS
  Filled 2020-07-20: qty 10

## 2020-07-20 MED ORDER — POTASSIUM CHLORIDE CRYS ER 20 MEQ PO TBCR
40.0000 meq | EXTENDED_RELEASE_TABLET | Freq: Every day | ORAL | Status: DC
  Administered 2020-07-20: 09:00:00 40 meq via ORAL
  Filled 2020-07-20: qty 2

## 2020-07-20 MED ORDER — LACTATED RINGERS IV BOLUS
1000.0000 mL | Freq: Once | INTRAVENOUS | Status: AC
  Administered 2020-07-20: 10:00:00 1000 mL via INTRAVENOUS

## 2020-07-20 MED ORDER — SODIUM CHLORIDE 0.9 % IV SOLN
2.0000 g | INTRAVENOUS | Status: DC
  Administered 2020-07-20 – 2020-07-22 (×3): 2 g via INTRAVENOUS
  Filled 2020-07-20 (×3): qty 20
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
## 2020-07-20 NOTE — Plan of Care (Signed)
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
## 2020-07-21 LAB — COMPREHENSIVE METABOLIC PANEL
ALT: 100 U/L — ABNORMAL HIGH (ref 0–44)
AST: 107 U/L — ABNORMAL HIGH (ref 15–41)
Albumin: 1.1 g/dL — ABNORMAL LOW (ref 3.5–5.0)
Alkaline Phosphatase: 72 U/L (ref 38–126)
Anion gap: 7 (ref 5–15)
BUN: 9 mg/dL (ref 6–20)
CO2: 27 mmol/L (ref 22–32)
Calcium: 7.5 mg/dL — ABNORMAL LOW (ref 8.9–10.3)
Chloride: 101 mmol/L (ref 98–111)
Creatinine, Ser: 0.41 mg/dL — ABNORMAL LOW (ref 0.61–1.24)
GFR, Estimated: >60 mL/min (ref >60)
Glucose, Bld: 98 mg/dL (ref 70–99)
Potassium: 3.5 mmol/L (ref 3.5–5.1)
Sodium: 135 mmol/L (ref 135–145)
Total Bilirubin: 0.6 mg/dL (ref 0.3–1.2)
Total Protein: 5 g/dL — ABNORMAL LOW (ref 6.5–8.1)

## 2020-07-21 LAB — CBC WITH DIFFERENTIAL/PLATELET
Abs Immature Granulocytes: 0.25 10*3/uL — ABNORMAL HIGH (ref 0.00–0.07)
Basophils Absolute: 0 10*3/uL (ref 0.0–0.1)
Basophils Relative: 0 %
Eosinophils Absolute: 0.1 10*3/uL (ref 0.0–0.5)
Eosinophils Relative: 1 %
HCT: 21.4 % — ABNORMAL LOW (ref 39.0–52.0)
Hemoglobin: 7.3 g/dL — ABNORMAL LOW (ref 13.0–17.0)
Immature Granulocytes: 2 %
Lymphocytes Relative: 7 %
Lymphs Abs: 1.1 10*3/uL (ref 0.7–4.0)
MCH: 30.8 pg (ref 26.0–34.0)
MCHC: 34.1 g/dL (ref 30.0–36.0)
MCV: 90.3 fL (ref 80.0–100.0)
Monocytes Absolute: 1.2 10*3/uL — ABNORMAL HIGH (ref 0.1–1.0)
Monocytes Relative: 7 %
Neutro Abs: 13.6 10*3/uL — ABNORMAL HIGH (ref 1.7–7.7)
Neutrophils Relative %: 83 %
Platelets: 490 10*3/uL — ABNORMAL HIGH (ref 150–400)
RBC: 2.37 MIL/uL — ABNORMAL LOW (ref 4.22–5.81)
RDW: 14.5 % (ref 11.5–15.5)
WBC: 16.2 10*3/uL — ABNORMAL HIGH (ref 4.0–10.5)
nRBC: 0 % (ref 0.0–0.2)

## 2020-07-21 LAB — BODY FLUID CULTURE: Culture: NO GROWTH

## 2020-07-21 LAB — PHOSPHORUS: Phosphorus: 2.5 mg/dL (ref 2.5–4.6)

## 2020-07-21 LAB — MAGNESIUM: Magnesium: 1.7 mg/dL (ref 1.7–2.4)

## 2020-07-21 LAB — C-REACTIVE PROTEIN: CRP: 18.7 mg/dL — ABNORMAL HIGH (ref <1.0)

## 2020-07-21 MED ORDER — MAGNESIUM SULFATE 4 GM/100ML IV SOLN
4.0000 g | Freq: Once | INTRAVENOUS | Status: AC
  Administered 2020-07-21: 09:00:00 4 g via INTRAVENOUS
  Filled 2020-07-21: qty 100

## 2020-07-21 MED ORDER — POTASSIUM CHLORIDE CRYS ER 20 MEQ PO TBCR
40.0000 meq | EXTENDED_RELEASE_TABLET | Freq: Two times a day (BID) | ORAL | Status: DC
  Administered 2020-07-21 (×2): 40 meq via ORAL
  Filled 2020-07-21 (×2): qty 2

## 2020-07-21 MED ORDER — POTASSIUM PHOSPHATES 15 MMOLE/5ML IV SOLN
30.0000 mmol | Freq: Once | INTRAVENOUS | Status: AC
  Administered 2020-07-21: 09:00:00 30 mmol via INTRAVENOUS
  Filled 2020-07-21: qty 10

## 2020-07-21 MED ORDER — LACTATED RINGERS IV BOLUS
500.0000 mL | Freq: Once | INTRAVENOUS | Status: AC
  Administered 2020-07-21: 11:00:00 500 mL via INTRAVENOUS
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
## 2020-07-22 ENCOUNTER — Inpatient Hospital Stay (HOSPITAL_COMMUNITY): Payer: Medicare Other

## 2020-07-22 DIAGNOSIS — K5981 Ogilvie syndrome: Secondary | ICD-10-CM

## 2020-07-22 DIAGNOSIS — K56699 Other intestinal obstruction unspecified as to partial versus complete obstruction: Secondary | ICD-10-CM

## 2020-07-22 DIAGNOSIS — G8389 Other specified paralytic syndromes: Secondary | ICD-10-CM

## 2020-07-22 DIAGNOSIS — E43 Unspecified severe protein-calorie malnutrition: Secondary | ICD-10-CM

## 2020-07-22 DIAGNOSIS — R030 Elevated blood-pressure reading, without diagnosis of hypertension: Secondary | ICD-10-CM

## 2020-07-22 DIAGNOSIS — J168 Pneumonia due to other specified infectious organisms: Secondary | ICD-10-CM | POA: Diagnosis not present

## 2020-07-22 DIAGNOSIS — D72829 Elevated white blood cell count, unspecified: Secondary | ICD-10-CM

## 2020-07-22 DIAGNOSIS — B9620 Unspecified Escherichia coli [E. coli] as the cause of diseases classified elsewhere: Secondary | ICD-10-CM

## 2020-07-22 DIAGNOSIS — M62461 Contracture of muscle, right lower leg: Secondary | ICD-10-CM

## 2020-07-22 DIAGNOSIS — K5521 Angiodysplasia of colon with hemorrhage: Secondary | ICD-10-CM

## 2020-07-22 DIAGNOSIS — J869 Pyothorax without fistula: Secondary | ICD-10-CM | POA: Diagnosis not present

## 2020-07-22 DIAGNOSIS — J90 Pleural effusion, not elsewhere classified: Secondary | ICD-10-CM

## 2020-07-22 DIAGNOSIS — K208 Other esophagitis without bleeding: Secondary | ICD-10-CM

## 2020-07-22 DIAGNOSIS — Z9889 Other specified postprocedural states: Secondary | ICD-10-CM

## 2020-07-22 DIAGNOSIS — M62462 Contracture of muscle, left lower leg: Secondary | ICD-10-CM

## 2020-07-22 LAB — CBC WITH DIFFERENTIAL/PLATELET
Abs Immature Granulocytes: 0.26 10*3/uL — ABNORMAL HIGH (ref 0.00–0.07)
Basophils Absolute: 0 10*3/uL (ref 0.0–0.1)
Basophils Relative: 0 %
Eosinophils Absolute: 0.1 10*3/uL (ref 0.0–0.5)
Eosinophils Relative: 0 %
HCT: 22.5 % — ABNORMAL LOW (ref 39.0–52.0)
Hemoglobin: 7.3 g/dL — ABNORMAL LOW (ref 13.0–17.0)
Immature Granulocytes: 2 %
Lymphocytes Relative: 7 %
Lymphs Abs: 1.2 10*3/uL (ref 0.7–4.0)
MCH: 30.2 pg (ref 26.0–34.0)
MCHC: 32.4 g/dL (ref 30.0–36.0)
MCV: 93 fL (ref 80.0–100.0)
Monocytes Absolute: 1.4 10*3/uL — ABNORMAL HIGH (ref 0.1–1.0)
Monocytes Relative: 8 %
Neutro Abs: 14 10*3/uL — ABNORMAL HIGH (ref 1.7–7.7)
Neutrophils Relative %: 83 %
Platelets: 545 10*3/uL — ABNORMAL HIGH (ref 150–400)
RBC: 2.42 MIL/uL — ABNORMAL LOW (ref 4.22–5.81)
RDW: 14.1 % (ref 11.5–15.5)
WBC: 17 10*3/uL — ABNORMAL HIGH (ref 4.0–10.5)
nRBC: 0 % (ref 0.0–0.2)

## 2020-07-22 LAB — CULTURE, BLOOD (ROUTINE X 2)
Culture: NO GROWTH
Culture: NO GROWTH
Special Requests: ADEQUATE
Special Requests: ADEQUATE

## 2020-07-22 LAB — COMPREHENSIVE METABOLIC PANEL
ALT: 92 U/L — ABNORMAL HIGH (ref 0–44)
AST: 92 U/L — ABNORMAL HIGH (ref 15–41)
Albumin: 1.1 g/dL — ABNORMAL LOW (ref 3.5–5.0)
Alkaline Phosphatase: 74 U/L (ref 38–126)
Anion gap: 9 (ref 5–15)
BUN: 11 mg/dL (ref 6–20)
CO2: 28 mmol/L (ref 22–32)
Calcium: 7.8 mg/dL — ABNORMAL LOW (ref 8.9–10.3)
Chloride: 96 mmol/L — ABNORMAL LOW (ref 98–111)
Creatinine, Ser: 0.57 mg/dL — ABNORMAL LOW (ref 0.61–1.24)
GFR, Estimated: >60 mL/min (ref >60)
Glucose, Bld: 102 mg/dL — ABNORMAL HIGH (ref 70–99)
Potassium: 4.2 mmol/L (ref 3.5–5.1)
Sodium: 133 mmol/L — ABNORMAL LOW (ref 135–145)
Total Bilirubin: 0.5 mg/dL (ref 0.3–1.2)
Total Protein: 5.2 g/dL — ABNORMAL LOW (ref 6.5–8.1)

## 2020-07-22 LAB — PATHOLOGIST SMEAR REVIEW

## 2020-07-22 LAB — PHOSPHORUS: Phosphorus: 2.9 mg/dL (ref 2.5–4.6)

## 2020-07-22 LAB — MAGNESIUM: Magnesium: 1.7 mg/dL (ref 1.7–2.4)

## 2020-07-22 MED ORDER — AMOXICILLIN-POT CLAVULANATE 875-125 MG PO TABS
1.0000 | ORAL_TABLET | Freq: Two times a day (BID) | ORAL | Status: DC
  Administered 2020-07-23 – 2020-07-30 (×15): 1 via ORAL
  Filled 2020-07-22 (×15): qty 1

## 2020-07-22 MED ORDER — AMOXICILLIN-POT CLAVULANATE 875-125 MG PO TABS: 1.0000 | ORAL_TABLET | Freq: Two times a day (BID) | ORAL | Status: DC

## 2020-07-22 MED ORDER — POTASSIUM CHLORIDE CRYS ER 20 MEQ PO TBCR
40.0000 meq | EXTENDED_RELEASE_TABLET | Freq: Every day | ORAL | Status: DC
  Administered 2020-07-22: 11:00:00 40 meq via ORAL
  Filled 2020-07-22: qty 2

## 2020-07-22 MED ORDER — POTASSIUM PHOSPHATES 15 MMOLE/5ML IV SOLN
20.0000 mmol | Freq: Once | INTRAVENOUS | Status: AC
  Administered 2020-07-22: 10:00:00 20 mmol via INTRAVENOUS
  Filled 2020-07-22: qty 6.67

## 2020-07-22 MED ORDER — MAGNESIUM SULFATE 4 GM/100ML IV SOLN
4.0000 g | Freq: Once | INTRAVENOUS | Status: AC
  Administered 2020-07-22: 09:00:00 4 g via INTRAVENOUS
  Filled 2020-07-22: qty 100
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
## 2020-07-23 LAB — CBC WITH DIFFERENTIAL/PLATELET
Abs Immature Granulocytes: 0.23 10*3/uL — ABNORMAL HIGH (ref 0.00–0.07)
Basophils Absolute: 0 10*3/uL (ref 0.0–0.1)
Basophils Relative: 0 %
Eosinophils Absolute: 0.1 10*3/uL (ref 0.0–0.5)
Eosinophils Relative: 0 %
HCT: 21 % — ABNORMAL LOW (ref 39.0–52.0)
Hemoglobin: 7.1 g/dL — ABNORMAL LOW (ref 13.0–17.0)
Immature Granulocytes: 2 %
Lymphocytes Relative: 9 %
Lymphs Abs: 1.3 10*3/uL (ref 0.7–4.0)
MCH: 30.9 pg (ref 26.0–34.0)
MCHC: 33.8 g/dL (ref 30.0–36.0)
MCV: 91.3 fL (ref 80.0–100.0)
Monocytes Absolute: 1.3 10*3/uL — ABNORMAL HIGH (ref 0.1–1.0)
Monocytes Relative: 9 %
Neutro Abs: 11.4 10*3/uL — ABNORMAL HIGH (ref 1.7–7.7)
Neutrophils Relative %: 80 %
Platelets: 568 10*3/uL — ABNORMAL HIGH (ref 150–400)
RBC: 2.3 MIL/uL — ABNORMAL LOW (ref 4.22–5.81)
RDW: 13.9 % (ref 11.5–15.5)
WBC: 14.3 10*3/uL — ABNORMAL HIGH (ref 4.0–10.5)
nRBC: 0 % (ref 0.0–0.2)

## 2020-07-23 LAB — COMPREHENSIVE METABOLIC PANEL
ALT: 86 U/L — ABNORMAL HIGH (ref 0–44)
AST: 88 U/L — ABNORMAL HIGH (ref 15–41)
Albumin: 1.1 g/dL — ABNORMAL LOW (ref 3.5–5.0)
Alkaline Phosphatase: 76 U/L (ref 38–126)
Anion gap: 7 (ref 5–15)
BUN: 12 mg/dL (ref 6–20)
CO2: 28 mmol/L (ref 22–32)
Calcium: 7.6 mg/dL — ABNORMAL LOW (ref 8.9–10.3)
Chloride: 97 mmol/L — ABNORMAL LOW (ref 98–111)
Creatinine, Ser: 0.52 mg/dL — ABNORMAL LOW (ref 0.61–1.24)
GFR, Estimated: >60 mL/min (ref >60)
Glucose, Bld: 125 mg/dL — ABNORMAL HIGH (ref 70–99)
Potassium: 4 mmol/L (ref 3.5–5.1)
Sodium: 132 mmol/L — ABNORMAL LOW (ref 135–145)
Total Bilirubin: 0.2 mg/dL — ABNORMAL LOW (ref 0.3–1.2)
Total Protein: 5.1 g/dL — ABNORMAL LOW (ref 6.5–8.1)

## 2020-07-23 LAB — O&P RESULT

## 2020-07-23 LAB — C-REACTIVE PROTEIN: CRP: 15.4 mg/dL — ABNORMAL HIGH (ref <1.0)

## 2020-07-23 LAB — OVA + PARASITE EXAM

## 2020-07-23 LAB — MAGNESIUM: Magnesium: 1.8 mg/dL (ref 1.7–2.4)

## 2020-07-23 LAB — PHOSPHORUS: Phosphorus: 2.5 mg/dL (ref 2.5–4.6)

## 2020-07-23 MED ORDER — BACLOFEN 10 MG PO TABS
5.0000 mg | ORAL_TABLET | Freq: Every day | ORAL | Status: DC
  Administered 2020-07-23: 22:00:00 5 mg via ORAL
  Filled 2020-07-23: qty 1

## 2020-07-23 MED ORDER — LACTATED RINGERS IV BOLUS
500.0000 mL | Freq: Once | INTRAVENOUS | Status: AC
  Administered 2020-07-23: 08:00:00 500 mL via INTRAVENOUS

## 2020-07-23 MED ORDER — POTASSIUM PHOSPHATES 15 MMOLE/5ML IV SOLN
20.0000 mmol | Freq: Once | INTRAVENOUS | Status: AC
  Administered 2020-07-23: 11:00:00 20 mmol via INTRAVENOUS
  Filled 2020-07-23: qty 6.67

## 2020-07-23 MED ORDER — MAGNESIUM SULFATE 50 % IJ SOLN
3.0000 g | Freq: Once | INTRAVENOUS | Status: AC
  Administered 2020-07-23: 09:00:00 3 g via INTRAVENOUS
  Filled 2020-07-23: qty 6
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
## 2020-07-23 NOTE — Evaluation (Signed)
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 He presented to the ED following an unwitnessed fall in the shower and change in his baseline following in that he was slumped over, drooling, and not as responsive. During the day M-F he is at [facility], per chart review. CT negative for cervical fx or acute intracranial abnormality. Further PMH: seizures, GERD, and depression.  Clinical Impression   Pt continuing to present with significant BLE and RUE contractures, LUE flaccidity, impaired skin integrity with multiple sores, and difficulty performing mobility tasks. Pt to benefit from acute PT to address deficits. Pt requiring max assist for bed mobility tasks, pt soiled in urine upon arrival to room. PT performed sustained, low load stretching to LEs, pt lacking significant hip extension, abduction, and knee extension ROM which appears chronic in nature. PT recommending air mattress for pt skin integrity. PT with concerns about pt pericare moving forward given significant LE contractures, RN encouraged to perform daily PROM as tolerated by pt. SNF remains appropriate, will sign off as pt is at baseline.      Follow Up Recommendations SNF    Equipment Recommendations  None recommended by PT    Recommendations for Other Services       Precautions / Restrictions Precautions Precautions: Fall Precaution Comments: High Fall Risk; seizures Restrictions Weight Bearing Restrictions: No      Mobility  Bed Mobility Overal bed mobility: Needs Assistance Bed Mobility: Rolling Rolling: Max assist         General bed mobility comments: Max assist for rolling bilaterally for trunk and LE management, pt soiled in  urine    Transfers                 General transfer comment: unable to attempt  Ambulation/Gait                Stairs            Wheelchair Mobility    Modified Rankin (Stroke Patients Only)       Balance Overall balance assessment: Needs assistance     Sitting balance - Comments: unable to assess today                                     Pertinent Vitals/Pain Pain Assessment: Faces Faces Pain Scale: Hurts a little bit Pain Location: PROM to bilat legs into extension or hip abduction Pain Descriptors / Indicators: Grimacing;Guarding;Discomfort Pain Intervention(s): Limited activity within patient's tolerance;Monitored during session;Repositioned    Home Living Family/patient expects to be discharged to:: Other (Comment) (day program, private caregiver x2 years)   Available Help at Discharge: Available 24 hours/day;Personal care attendant                  Prior Function           Comments: Pt contracted in bilateral LEs, appearing chronically bed-level. Per caregiver report, pt was walking fully upright without AD or assist.     Hand Dominance        Extremity/Trunk Assessment   Upper Extremity Assessment Upper Extremity Assessment: RUE deficits/detail;LUE deficits/detail RUE Deficits / Details: Holds RUE in flexor posture, RN ranging RUE  LUE Deficits / Details: flaccidity    Lower Extremity Assessment Lower Extremity Assessment: RLE deficits/detail;LLE deficits/detail RLE Deficits / Details: Flexor tone, holds LEs in scissored position with LLE crossed over R (indention in RLE from LLE chronically resting against each other). Flexion contractures, lacking 90* hip extension, >90* knee extension. Bilateral feet curled in toe flexion, both feet appear to have fatty pads on plantar surface suggestive of lack of weight bearing. eschar wound On R greater trochanter LLE Deficits / Details: see above        Communication   Communication: Expressive difficulties (states "yes, no, eat")  Cognition Arousal/Alertness: Awake/alert Behavior During Therapy: Flat affect Overall Cognitive Status: No family/caregiver present to determine baseline cognitive functioning                                 General Comments: states yes/no, eat only during session. Appropriately grimaces at end range ROM      General Comments General comments (skin integrity, edema, etc.): sacral, R>L greater trochanter, L lateral malleolus wounds    Exercises Other Exercises Other Exercises: PROM (low load, sustained over 1 minute) in direction of hip abduction, hip extension, and knee extension; bilateral LEs   Assessment/Plan    PT Assessment Patent does not need any further PT services;All further PT needs can be met in the next venue of care  PT Problem List Decreased strength;Decreased activity tolerance;Decreased balance;Decreased mobility;Decreased coordination;Decreased cognition;Decreased knowledge of use of DME;Decreased safety awareness;Decreased knowledge of precautions;Decreased skin integrity;Pain       PT Treatment Interventions      PT Goals (Current goals can be found in the Care Plan section)  Acute Rehab PT Goals PT Goal Formulation: Patient unable to participate in goal setting Time For Goal Achievement: 07/23/20 Potential to Achieve Goals: Fair    Frequency     Barriers to discharge        Co-evaluation               AM-PAC PT "6 Clicks" Mobility  Outcome Measure Help needed turning from your back to your side while in a flat bed without using bedrails?: Total Help needed moving from lying on your back to sitting on the side of a flat bed without using bedrails?: Total Help needed moving to and from a bed to a chair (including a wheelchair)?: Total Help needed standing up from a chair using your arms (e.g., wheelchair or bedside chair)?: Total Help needed to walk in  hospital room?: Total Help needed climbing 3-5 steps with a railing? : Total 6 Click Score: 6    End of Session   Activity Tolerance: Other (comment) Patient left: in bed;with call bell/phone within reach;with nursing/sitter in room Nurse Communication: Mobility status;Other (comment) (need for rotating air mattress) PT Visit Diagnosis: Other abnormalities of gait and mobility (R26.89)    Time: 4128-7867 PT Time Calculation (min) (ACUTE ONLY): 22 min   Charges:   PT Evaluation $PT Eval Low Complexity: 1 Low         Carolyn Sylvia S, PT Acute Rehabilitation Services Pager [phone]  Office [phone]  Louis Matte 07/23/2020, 2:43 PM
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
## 2020-07-24 DIAGNOSIS — Z7189 Other specified counseling: Secondary | ICD-10-CM

## 2020-07-24 DIAGNOSIS — E871 Hypo-osmolality and hyponatremia: Secondary | ICD-10-CM

## 2020-07-24 DIAGNOSIS — J869 Pyothorax without fistula: Secondary | ICD-10-CM | POA: Diagnosis not present

## 2020-07-24 DIAGNOSIS — L89159 Pressure ulcer of sacral region, unspecified stage: Secondary | ICD-10-CM

## 2020-07-24 DIAGNOSIS — R4182 Altered mental status, unspecified: Secondary | ICD-10-CM | POA: Diagnosis not present

## 2020-07-24 DIAGNOSIS — D62 Acute posthemorrhagic anemia: Secondary | ICD-10-CM | POA: Diagnosis not present

## 2020-07-24 DIAGNOSIS — M24561 Contracture, right knee: Secondary | ICD-10-CM

## 2020-07-24 DIAGNOSIS — R7881 Bacteremia: Secondary | ICD-10-CM | POA: Diagnosis not present

## 2020-07-24 DIAGNOSIS — J168 Pneumonia due to other specified infectious organisms: Secondary | ICD-10-CM | POA: Diagnosis not present

## 2020-07-24 DIAGNOSIS — J90 Pleural effusion, not elsewhere classified: Secondary | ICD-10-CM | POA: Diagnosis not present

## 2020-07-24 DIAGNOSIS — E878 Other disorders of electrolyte and fluid balance, not elsewhere classified: Secondary | ICD-10-CM

## 2020-07-24 DIAGNOSIS — Z515 Encounter for palliative care: Secondary | ICD-10-CM

## 2020-07-24 DIAGNOSIS — R000 Tachycardia, unspecified: Secondary | ICD-10-CM

## 2020-07-24 DIAGNOSIS — G8250 Quadriplegia, unspecified: Secondary | ICD-10-CM

## 2020-07-24 DIAGNOSIS — Z9889 Other specified postprocedural states: Secondary | ICD-10-CM

## 2020-07-24 DIAGNOSIS — M24562 Contracture, left knee: Secondary | ICD-10-CM

## 2020-07-24 LAB — CBC WITH DIFFERENTIAL/PLATELET
Abs Immature Granulocytes: 0.24 10*3/uL — ABNORMAL HIGH (ref 0.00–0.07)
Basophils Absolute: 0 10*3/uL (ref 0.0–0.1)
Basophils Relative: 0 %
Eosinophils Absolute: 0.1 10*3/uL (ref 0.0–0.5)
Eosinophils Relative: 0 %
HCT: 20.9 % — ABNORMAL LOW (ref 39.0–52.0)
Hemoglobin: 7.1 g/dL — ABNORMAL LOW (ref 13.0–17.0)
Immature Granulocytes: 2 %
Lymphocytes Relative: 7 %
Lymphs Abs: 1.1 10*3/uL (ref 0.7–4.0)
MCH: 31.1 pg (ref 26.0–34.0)
MCHC: 34 g/dL (ref 30.0–36.0)
MCV: 91.7 fL (ref 80.0–100.0)
Monocytes Absolute: 1.5 10*3/uL — ABNORMAL HIGH (ref 0.1–1.0)
Monocytes Relative: 10 %
Neutro Abs: 11.6 10*3/uL — ABNORMAL HIGH (ref 1.7–7.7)
Neutrophils Relative %: 81 %
Platelets: 578 10*3/uL — ABNORMAL HIGH (ref 150–400)
RBC: 2.28 MIL/uL — ABNORMAL LOW (ref 4.22–5.81)
RDW: 13.9 % (ref 11.5–15.5)
WBC: 14.5 10*3/uL — ABNORMAL HIGH (ref 4.0–10.5)
nRBC: 0 % (ref 0.0–0.2)

## 2020-07-24 LAB — COMPREHENSIVE METABOLIC PANEL
ALT: 102 U/L — ABNORMAL HIGH (ref 0–44)
AST: 118 U/L — ABNORMAL HIGH (ref 15–41)
Albumin: 1.1 g/dL — ABNORMAL LOW (ref 3.5–5.0)
Alkaline Phosphatase: 82 U/L (ref 38–126)
Anion gap: 7 (ref 5–15)
BUN: 9 mg/dL (ref 6–20)
CO2: 30 mmol/L (ref 22–32)
Calcium: 7.8 mg/dL — ABNORMAL LOW (ref 8.9–10.3)
Chloride: 95 mmol/L — ABNORMAL LOW (ref 98–111)
Creatinine, Ser: 0.4 mg/dL — ABNORMAL LOW (ref 0.61–1.24)
GFR, Estimated: >60 mL/min (ref >60)
Glucose, Bld: 130 mg/dL — ABNORMAL HIGH (ref 70–99)
Potassium: 4 mmol/L (ref 3.5–5.1)
Sodium: 132 mmol/L — ABNORMAL LOW (ref 135–145)
Total Bilirubin: <0.1 mg/dL — ABNORMAL LOW (ref 0.3–1.2)
Total Protein: 5.4 g/dL — ABNORMAL LOW (ref 6.5–8.1)

## 2020-07-24 LAB — MAGNESIUM: Magnesium: 1.7 mg/dL (ref 1.7–2.4)

## 2020-07-24 LAB — PHOSPHORUS: Phosphorus: 2.8 mg/dL (ref 2.5–4.6)

## 2020-07-24 MED ORDER — MAGNESIUM SULFATE 4 GM/100ML IV SOLN
4.0000 g | Freq: Once | INTRAVENOUS | Status: AC
  Administered 2020-07-24: 09:00:00 4 g via INTRAVENOUS
  Filled 2020-07-24: qty 100

## 2020-07-24 MED ORDER — POTASSIUM PHOSPHATES 15 MMOLE/5ML IV SOLN
30.0000 mmol | Freq: Once | INTRAVENOUS | Status: AC
  Administered 2020-07-24: 09:00:00 30 mmol via INTRAVENOUS
  Filled 2020-07-24: qty 10
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
## 2020-07-24 NOTE — Consult Note (Signed)
Physical Medicine and Rehabilitation Consult Reason for Consult: Decreased functional ability with severe cognitive impairments/contractures/failure to thrive Referring Physician: Teaching service   HPI: Andres [name] is a 39 y.o. right-handed male with significant cognitive impairments fetal alcohol syndrome, autism, seizures, lactose intolerance.  History taken from chart review due to cognition.  Patient has private caregivers x2 years 24/7.  He attends adult daycare Monday through Friday.  At baseline reportedly ambulated unassisted bathes and dresses on his own.  He presented on 07/02/2020 after a fall in the shower, with AMS.  Admission chemistries lactic acid within normal limits, hemoglobin 13.4, CK 2388.  CT personally reviewed, unremarkable for acute abdominal process.  CT of the abdomen suggestive of possible ileus.  Gastroenterology services consulted for patient's ileus with bouts of vomitting.  Underwent upper endoscopy on 07/07/2020 showing esophagitis with no bleeding as well as medium size hiatal hernia with 3 oozing angiodysplastic lesions in the stomach treated with argon plasma coagulation.  He remains on Protonix twice daily.  Findings of right side pneumonia with loculated pleural effusion undergone thoracentesis on 07/18/2020 with unsuccessful attempt at pigtail catheter placement.  He currently remains on Augmentin after transition from IV antibiotics after developing E. coli bacteremia, however, ID considering need to retap given locutated effusion and underlying possibility of PNA.  Palliative care has been consulted to establish goals of care, notes reviewed- guardian to discuss with "superiors".  Patient with pronounced contractures limiting overall mobility physical medicine rehab consult requested for recommendations in regards to spasticity.  Review of Systems  Unable to perform ROS: Medical condition   Past Medical History:  Diagnosis Date  . Autism   .  Depression   . Disorder of intellectual development   . GERD (gastroesophageal reflux disease)   . History of seizures    Past Surgical History:  Procedure Laterality Date  . BIOPSY  07/07/2020   Procedure: BIOPSY;  Surgeon: Meryl Dare, MD;  Location: [facility] ENDOSCOPY;  Service: Endoscopy;;  . ESOPHAGOGASTRODUODENOSCOPY N/A 07/07/2020   Procedure: ESOPHAGOGASTRODUODENOSCOPY (EGD);  Surgeon: Meryl Dare, MD;  Location: [facility] ENDOSCOPY;  Service: Endoscopy;  Laterality: N/A;  . HOT HEMOSTASIS N/A 07/07/2020   Procedure: HOT HEMOSTASIS (ARGON PLASMA COAGULATION/BICAP);  Surgeon: Meryl Dare, MD;  Location: [facility] ENDOSCOPY;  Service: Endoscopy;  Laterality: N/A;  . IR THORACENTESIS ASP PLEURAL SPACE W/IMG GUIDE  07/18/2020   Family History  Problem Relation Age of Onset  . Mental illness Mother    Social History:  reports that he has never smoked. He has never used smokeless tobacco. He reports previous alcohol use. He reports previous drug use. Allergies:  Allergies  Allergen Reactions  . Lactose Diarrhea   Medications Prior to Admission  Medication Sig Dispense Refill  . ARIPiprazole (ABILIFY) 10 MG tablet Take 10 mg by mouth in the morning.     . Cholecalciferol (VITAMIN D-3) 25 MCG (1000 UT) CAPS Take 1,000 Units by mouth daily.     . famotidine (PEPCID) 20 MG tablet Take 20 mg by mouth 2 (two) times daily.    . hydrOXYzine (ATARAX/VISTARIL) 25 MG tablet Take 25 mg by mouth daily as needed (for agitation).     . traZODone (DESYREL) 100 MG tablet Take 100 mg by mouth at bedtime.       Home: Home Living Family/patient expects to be discharged to:: Other (Comment) (day program, private caregiver x2 years) Living Arrangements: Other (Comment) (Caregivers with alternative family living) Available Help at Discharge: Available  24 hours/day,Personal care attendant  Functional History: Prior Function Level of Independence: Needs assistance Comments: Pt contracted in bilateral  LEs, appearing chronically bed-level. Per caregiver report, pt was walking fully upright without AD or assist. Functional Status:  Mobility: Bed Mobility Overal bed mobility: Needs Assistance Bed Mobility: Rolling Rolling: Max assist Sidelying to sit: Max assist,+2 for physical assistance,+2 for safety/equipment Sit to supine: Max assist,+2 for physical assistance Sit to sidelying: Max assist,+2 for physical assistance,+2 for safety/equipment General bed mobility comments: Max A for rolling R<>L and for supine scoot toward HOB. Bed in trendelenburg. Transfers Overall transfer level: Needs assistance Equipment used: 2 person hand held assist Transfers: Sit to/from Stand Sit to Stand: Max assist,+2 physical assistance,+2 safety/equipment General transfer comment: Unable to attempt for patient/staff safety. Ambulation/Gait Ambulation/Gait assistance: Max assist,+2 physical assistance,+2 safety/equipment Gait Distance (Feet): 1 Feet Assistive device: 2 person hand held assist Gait Pattern/deviations: Decreased stride length,Scissoring,Shuffle,Trunk flexed,Narrow base of support General Gait Details: Pt declined attempt. Gait velocity: decreased Gait velocity interpretation: <1.31 ft/sec, indicative of household ambulator    ADL: ADL Overall ADL's : Needs assistance/impaired Eating/Feeding: Moderate assistance,Bed level Eating/Feeding Details (indicate cue type and reason): Patient able to abduct thumb of R hand to take bacon and grapes from therapists hand and bring to mouth. Unable to grasp or maintain position of cup in hand to take sips. May benefit from DME to increase independence with self-feeding. Grooming: Maximal assistance,Bed level Upper Body Bathing: Total assistance,Bed level Lower Body Bathing: Total assistance,Bed level Upper Body Dressing : Total assistance,Bed level Upper Body Dressing Details (indicate cue type and reason): Patient declined donning gown. Prefers to  be nude.  Lower Body Dressing: Total assistance,Bed level Lower Body Dressing Details (indicate cue type and reason): Total A to don footwear. Patient unable to extend BLE 2/2 flexor tone.  Toilet Transfer Details (indicate cue type and reason): Patient wears incontinence briefs at baseline.  Toileting- Clothing Manipulation and Hygiene: Total assistance Toileting - Clothing Manipulation Details (indicate cue type and reason): Total A for hygiene/clothing management at bed level. Incontinent x2. Functional mobility during ADLs: Total assistance,+2 for physical assistance,+2 for safety/equipment General ADL Comments: Grossly Max A to Total A for ADLs. Presumed baseline level of function.  Cognition: Cognition Overall Cognitive Status: History of cognitive impairments - at baseline Orientation Level: Oriented to person Cognition Arousal/Alertness: Awake/alert Behavior During Therapy: Flat affect Overall Cognitive Status: History of cognitive impairments - at baseline General Comments: Can respond yes/no and say a few other words like "eat" and "bye".  Blood pressure 105/72, pulse (!) 105, temperature 97.9 F (36.6 C), temperature source Oral, resp. rate 20, height  (1.676 m), weight 46.5 kg, SpO2 100 %. Physical Exam Vitals reviewed.  Constitutional:      Comments: Cachectic  HENT:     Head:     Comments: Macrocephaly    Right Ear: External ear normal.     Left Ear: External ear normal.     Nose: Nose normal.  Eyes:     General:        Right eye: No discharge.        Left eye: No discharge.  Cardiovascular:     Rate and Rhythm: Normal rate and regular rhythm.  Pulmonary:     Effort: Pulmonary effort is normal. No respiratory distress.     Breath sounds: No stridor.  Abdominal:     General: Abdomen is flat. There is no distension.  Musculoskeletal:     Cervical back:  Normal range of motion and neck supple.     Comments: No edema or tenderness in extremities Bilateral  upper extremity contractures, right> left.  Able to range of left upper extremity, however with significant visible pain Bilateral severe lower extremity contractures  Skin:    General: Skin is warm and dry.  Neurological:     Mental Status: He is alert.     Comments: Alert Unable to assess motor function due to contractures, pain, and participation  Psychiatric:     Comments: Unable to assess due to cognition     Results for orders placed or performed during the hospital encounter of 07/01/20 (from the past 24 hour(s))  Comprehensive metabolic panel     Status: Abnormal   Collection Time: 07/24/20  4:43 AM  Result Value Ref Range   Sodium 132 (L) 135 - 145 mmol/L   Potassium 4.0 3.5 - 5.1 mmol/L   Chloride 95 (L) 98 - 111 mmol/L   CO2 30 22 - 32 mmol/L   Glucose, Bld 130 (H) 70 - 99 mg/dL   BUN 9 6 - 20 mg/dL   Creatinine, Ser 5.42 (L) 0.61 - 1.24 mg/dL   Calcium 7.8 (L) 8.9 - 10.3 mg/dL   Total Protein 5.4 (L) 6.5 - 8.1 g/dL   Albumin 1.1 (L) 3.5 - 5.0 g/dL   AST 706 (H) 15 - 41 U/L   ALT 102 (H) 0 - 44 U/L   Alkaline Phosphatase 82 38 - 126 U/L   Total Bilirubin <0.1 (L) 0.3 - 1.2 mg/dL   GFR, Estimated >23 >76 mL/min   Anion gap 7 5 - 15  CBC with Differential/Platelet     Status: Abnormal   Collection Time: 07/24/20  4:43 AM  Result Value Ref Range   WBC 14.5 (H) 4.0 - 10.5 K/uL   RBC 2.28 (L) 4.22 - 5.81 MIL/uL   Hemoglobin 7.1 (L) 13.0 - 17.0 g/dL   HCT 28.3 (L) 15.1 - 76.1 %   MCV 91.7 80.0 - 100.0 fL   MCH 31.1 26.0 - 34.0 pg   MCHC 34.0 30.0 - 36.0 g/dL   RDW 60.7 37.1 - 06.2 %   Platelets 578 (H) 150 - 400 K/uL   nRBC 0.0 0.0 - 0.2 %   Neutrophils Relative % 81 %   Neutro Abs 11.6 (H) 1.7 - 7.7 K/uL   Lymphocytes Relative 7 %   Lymphs Abs 1.1 0.7 - 4.0 K/uL   Monocytes Relative 10 %   Monocytes Absolute 1.5 (H) 0.1 - 1.0 K/uL   Eosinophils Relative 0 %   Eosinophils Absolute 0.1 0.0 - 0.5 K/uL   Basophils Relative 0 %   Basophils Absolute 0.0 0.0 -  0.1 K/uL   Immature Granulocytes 2 %   Abs Immature Granulocytes 0.24 (H) 0.00 - 0.07 K/uL  Magnesium     Status: None   Collection Time: 07/24/20  4:43 AM  Result Value Ref Range   Magnesium 1.7 1.7 - 2.4 mg/dL  Phosphorus     Status: None   Collection Time: 07/24/20  4:43 AM  Result Value Ref Range   Phosphorus 2.8 2.5 - 4.6 mg/dL   No results found.  Assessment/Plan: 39 year old with past medical history of significant cognitive impairment, fetal alcohol syndrome, autism, seizures, lactose intolerance, GERD, depression, pseudoobstruction, recent upper GI bleed, refeeding syndrome, bacteremia who presents with severe spasticity/contractures complicated by guarding in lower extremities > RUE> LUE.  1.  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  Given patient's current infections and ongoing work-up, recommend aggressive treatment of infection as it relates to improvement in spasticity/contractures.  Unable to tolerate baclofen due to lethargy  Unfortunately, vast majority of antispasticity medications carry the side effect of drowsiness.  Dantrolene is an alternative, however given his elevated and rising LFTs, not recommended at this time.  May trial Valium 2 mg nightly and increase to twice daily if tolerated, again limited due to transaminitis  If guardian chooses more aggressive intervention, intervention such as casting and intrathecal baclofen may also be considered, however would await for resolution of infection.  Further, this not recommended at this time given current functional state and comorbidities and would require further discussion as well as resources and close follow-up.  Continue attempts at range of motion  Appreciate palliative  recs  Discussed with primary team  Will continue to follow  2.  Sepsis  Right lung pneumonia and loculated effusion with bacteremia  WBCs remain elevated, consider repeat thoracentesis if aggressive intervention is determined planned  Further recs per ID  See #1   3.  Upper GI bleed  Hemoglobin 7.1 on 12/29  Continue to monitor closely and transfuse if hemoglobin <7.0   4.  Transaminitis  See above  Monitor LFTs daily.    Mcarthur Rossetti Angiulli, PA-C 07/24/2020   I have personally performed a face to face diagnostic evaluation, including, but not limited to relevant history and physical exam findings, of this patient and developed relevant assessment and plan.  Additionally, I have reviewed and concur with the physician assistant's documentation above.  Maryla Morrow, MD, ABPMR
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
## 2020-07-24 NOTE — Progress Notes (Signed)
Nutrition Follow-up  DOCUMENTATION CODES:   Severe malnutrition in context of chronic illness  INTERVENTION:   Continue snacks TID  ContinueEnsure Enlive poTID, each supplement provides 350 kcal and 20 grams of protein  ContinueMagic cup TID with meals, each supplement provides 290 kcal and 9 grams of protein  ContinueMVI with minerals daily   NUTRITION DIAGNOSIS:   Severe Malnutrition related to chronic illness (fetal alcohol syndrome and autism) as evidenced by severe muscle depletion,severe fat depletion.  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 Pt found to have upper GI bleed 2/2 angiodysplasia (resolved) and acute normocytic anemia (resolved). 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 Per MD, hiatal hernia and Grade D esophageal reflux may be contributing to chronic vomiting. Follow up gastrografin follow through study showed normal passage through the colon and rectum. Pt was treated conservatively with NGT decompression and N/V have since resolved.  Pt nonverbal at baseline.  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 Biopsied.  12/13- diet advanced to soft; GI signed off 12/14- diet advanced to regular 12/23- R thoracentesis due to pleural effusion  Palliative Care was consulted to assess pt. Per PMT documentation, pt has been declining  since November of last year. PMT recommending pt be made DNR. Pt's guardian requested additional documentation to present to her colleagues/superiors so that a uniform decision can be made regarding pt's code status and hospice consideration. Pt continues as full code for now with ongoing palliative support.   Pt continues to have good po intake, 80-100% completion x last 8 recorded meals (97% average intake). Pt has been receiving Boost Breeze TID, snacks TID, and Magic Cup TID. Pt consuming supplements well per RN.   UOP: x24 hours  Admit wt: 46.8 kg Current wt: 46.5 kg  Labs: Na 132 (L) Medications: lactaid, mvi with minerals, thiamine  Diet Order:   Diet Order            Diet regular Room service appropriate? Yes; Fluid consistency: Thin  Diet effective now                 EDUCATION NEEDS:   Not appropriate for education at this time  Skin:  Skin Assessment: Skin Integrity Issues: Skin Integrity Issues:: Stage I,Stage II Stage I: rt proximal hip Stage II: sacrum, rt proximal hip, left lateral back Other: n/a  Last BM:  12/21  Height:   Ht Readings from Last 1 Encounters:  07/07/20 5\' 6"  (1.676 m)    Weight:   Wt Readings from Last 1 Encounters:  07/24/20 46.5 kg    Ideal Body Weight:  61.8 kg  BMI:  Body mass index is 16.55 kg/m.  Estimated Nutritional Needs:   Kcal:  1850-2050  Protein:  90-105 grams  Fluid:  > 1.8 L    07/26/20, MS, RD, LDN RD pager number and weekend/on-call pager number located in [place].

## 2020-07-24 NOTE — Progress Notes (Signed)
Occupational Therapy Evaluation Patient Details Name: Andres [name] MRN: [id number] DOB: 07/13/1981 Today's Date: 07/24/2020    History of present illness 39 y.o. male presenting s/p unwhitnessed fall at home in the shower and changes from baseline including drooling. CXR (-), XR pelvis (-), and CT head/spine (-) for acute findings. EEG (-) for seizures. CT abdomen (+) SBO vs. illeus s/p  NG tube placement. Suspected fall 2/2 dehydration from diarrhea. Hosptial course complicated Pneumonia with pleural effusion, E coli bacteremia, upper GI bleed, and LUE flaccidity with concern for R MCA vs. TIA 2/2 complex medical Hx. vs. LUE/plexus/mechanical etiology. Imaging (-) for acute findings. Patient with continued LUE weakness. PMHx significant for fetal alcohol symdrome with cognitive impairment, Autism, non-verbal, and behaviors.   OT comments  Patient met lying supine in bed in agreement with OT assessment. Patient is very familiar to this therapist 2/2 extended hospital stay. Noted flaccidity at LUE with patient unable to incorporate into functional activity including self-feeding and grooming tasks at bed level. Patient was able to use LUE to some extent upon initial evaluation but was unable to follow 1-step verbal commands for incorporation into functional tasks. Patient also with increased severity in BUE/BLE extremity contractures with swelling at bilateral feet +wounds. RN made aware. Patient continues to require Max A to Total A +2 for bed level ADLs but is tolerating increased passive movement of all 4 limbs. Patient able to pronate/supinate forearm with external assist and abduct/adduct R thumb to grasp finger foods from therapist with hand over hand assist. Patient unable to extend digits of R hand to manage utensils or grasp and maintain position of cup without hand over hand assist. Patient would benefit from 1-2 more acute OT treatment sessions for staff/family education to maximize safety and  independence with self-feeding/grooming tasks with use of AE and HEP to maximize function in LUE. Patient may also benefit from acute OT to identify splinting needs. Continued recommendation for SNF placement post d/c.    Follow Up Recommendations  SNF    Equipment Recommendations  Other (comment) (Defer to next level of care)    Recommendations for Other Services      Precautions / Restrictions Precautions Precautions: Fall Restrictions Weight Bearing Restrictions: No       Mobility Bed Mobility Overal bed mobility: Needs Assistance Bed Mobility: Rolling Rolling: Max assist         General bed mobility comments: Max A for rolling R<>L and for supine scoot toward HOB. Bed in trendelenburg.  Transfers Overall transfer level: Needs assistance               General transfer comment: Unable to attempt for patient/staff safety.    Balance                                           ADL either performed or assessed with clinical judgement   ADL Overall ADL's : Needs assistance/impaired Eating/Feeding: Moderate assistance;Bed level Eating/Feeding Details (indicate cue type and reason): Patient able to abduct thumb of R hand to take bacon and grapes from therapists hand and bring to mouth. Unable to grasp or maintain position of cup in hand to take sips. May benefit from DME to increase independence with self-feeding. Grooming: Maximal assistance;Bed level                       Toileting-  Clothing Manipulation and Hygiene: Total assistance Toileting - Clothing Manipulation Details (indicate cue type and reason): Total A for hygiene/clothing management at bed level. Incontinent x2.             Vision       Perception     Praxis      Cognition Arousal/Alertness: Awake/alert Behavior During Therapy: Flat affect Overall Cognitive Status: History of cognitive impairments - at baseline                                  General Comments: Can respond yes/no and say a few other words like "eat" and "bye".        Exercises     Shoulder Instructions       General Comments      Pertinent Vitals/ Pain       Pain Assessment: Faces Faces Pain Scale: Hurts little more Pain Location: PROM in BLE during attempt to reposition. Pain Descriptors / Indicators: Grimacing;Guarding;Discomfort Pain Intervention(s): Limited activity within patient's tolerance;Monitored during session;Repositioned  Home Living                                          Prior Functioning/Environment              Frequency  Min 1X/week        Progress Toward Goals  OT Goals(current goals can now be found in the care plan section)     Acute Rehab OT Goals Patient Stated Goal: Unable to state.  OT Goal Formulation: Patient unable to participate in goal setting Time For Goal Achievement: 08/07/20 Potential to Achieve Goals: Poor  Plan      Co-evaluation                 AM-PAC OT "6 Clicks" Daily Activity     Outcome Measure   Help from another person eating meals?: A Lot Help from another person taking care of personal grooming?: Total Help from another person toileting, which includes using toliet, bedpan, or urinal?: Total Help from another person bathing (including washing, rinsing, drying)?: Total Help from another person to put on and taking off regular upper body clothing?: Total Help from another person to put on and taking off regular lower body clothing?: Total 6 Click Score: 7    End of Session    OT Visit Diagnosis: Unsteadiness on feet (R26.81);Muscle weakness (generalized) (M62.81)   Activity Tolerance Patient tolerated treatment well   Patient Left in bed;with call bell/phone within reach;with nursing/sitter in room   Nurse Communication Other (comment) (CLOF)        Time: 8889-1694 OT Time Calculation (min): 29 min  Charges: OT General Charges $OT Visit: 1  Visit OT Evaluation $OT Eval Moderate Complexity: 1 Mod OT Treatments $Self Care/Home Management : 8-22 mins  Andres [name] Supplemental OT, Department of rehab services [phone]   Andres Brewton R H. 07/24/2020, 9:12 AM

## 2020-07-25 ENCOUNTER — Inpatient Hospital Stay (HOSPITAL_COMMUNITY): Payer: Medicare Other

## 2020-07-25 DIAGNOSIS — M6249 Contracture of muscle, multiple sites: Secondary | ICD-10-CM

## 2020-07-25 DIAGNOSIS — J90 Pleural effusion, not elsewhere classified: Secondary | ICD-10-CM | POA: Diagnosis not present

## 2020-07-25 DIAGNOSIS — K7290 Hepatic failure, unspecified without coma: Secondary | ICD-10-CM

## 2020-07-25 DIAGNOSIS — R627 Adult failure to thrive: Secondary | ICD-10-CM | POA: Diagnosis not present

## 2020-07-25 DIAGNOSIS — J168 Pneumonia due to other specified infectious organisms: Secondary | ICD-10-CM | POA: Diagnosis not present

## 2020-07-25 DIAGNOSIS — Z515 Encounter for palliative care: Secondary | ICD-10-CM | POA: Diagnosis not present

## 2020-07-25 DIAGNOSIS — Z7189 Other specified counseling: Secondary | ICD-10-CM | POA: Diagnosis not present

## 2020-07-25 DIAGNOSIS — J869 Pyothorax without fistula: Secondary | ICD-10-CM | POA: Diagnosis not present

## 2020-07-25 DIAGNOSIS — B9620 Unspecified Escherichia coli [E. coli] as the cause of diseases classified elsewhere: Secondary | ICD-10-CM | POA: Diagnosis not present

## 2020-07-25 LAB — COMPREHENSIVE METABOLIC PANEL
ALT: 85 U/L — ABNORMAL HIGH (ref 0–44)
AST: 75 U/L — ABNORMAL HIGH (ref 15–41)
Albumin: 1.2 g/dL — ABNORMAL LOW (ref 3.5–5.0)
Alkaline Phosphatase: 81 U/L (ref 38–126)
Anion gap: 9 (ref 5–15)
BUN: 11 mg/dL (ref 6–20)
CO2: 29 mmol/L (ref 22–32)
Calcium: 7.9 mg/dL — ABNORMAL LOW (ref 8.9–10.3)
Chloride: 94 mmol/L — ABNORMAL LOW (ref 98–111)
Creatinine, Ser: 0.52 mg/dL — ABNORMAL LOW (ref 0.61–1.24)
GFR, Estimated: >60 mL/min (ref >60)
Glucose, Bld: 105 mg/dL — ABNORMAL HIGH (ref 70–99)
Potassium: 3.8 mmol/L (ref 3.5–5.1)
Sodium: 132 mmol/L — ABNORMAL LOW (ref 135–145)
Total Bilirubin: 0.6 mg/dL (ref 0.3–1.2)
Total Protein: 5.4 g/dL — ABNORMAL LOW (ref 6.5–8.1)

## 2020-07-25 LAB — CBC WITH DIFFERENTIAL/PLATELET
Abs Immature Granulocytes: 0.34 10*3/uL — ABNORMAL HIGH (ref 0.00–0.07)
Abs Immature Granulocytes: 0.45 10*3/uL — ABNORMAL HIGH (ref 0.00–0.07)
Basophils Absolute: 0 10*3/uL (ref 0.0–0.1)
Basophils Absolute: 0.1 10*3/uL (ref 0.0–0.1)
Basophils Relative: 0 %
Basophils Relative: 0 %
Eosinophils Absolute: 0 10*3/uL (ref 0.0–0.5)
Eosinophils Absolute: 0.1 10*3/uL (ref 0.0–0.5)
Eosinophils Relative: 0 %
Eosinophils Relative: 0 %
HCT: 20.9 % — ABNORMAL LOW (ref 39.0–52.0)
HCT: 22.8 % — ABNORMAL LOW (ref 39.0–52.0)
Hemoglobin: 6.7 g/dL — CL (ref 13.0–17.0)
Hemoglobin: 7.6 g/dL — ABNORMAL LOW (ref 13.0–17.0)
Immature Granulocytes: 2 %
Immature Granulocytes: 2 %
Lymphocytes Relative: 5 %
Lymphocytes Relative: 7 %
Lymphs Abs: 1 10*3/uL (ref 0.7–4.0)
Lymphs Abs: 1.3 10*3/uL (ref 0.7–4.0)
MCH: 29.6 pg (ref 26.0–34.0)
MCH: 30.5 pg (ref 26.0–34.0)
MCHC: 32.1 g/dL (ref 30.0–36.0)
MCHC: 33.3 g/dL (ref 30.0–36.0)
MCV: 92.5 fL (ref 80.0–100.0)
MCV: 91.6 fL (ref 80.0–100.0)
Monocytes Absolute: 1.7 10*3/uL — ABNORMAL HIGH (ref 0.1–1.0)
Monocytes Absolute: 1.9 10*3/uL — ABNORMAL HIGH (ref 0.1–1.0)
Monocytes Relative: 9 %
Monocytes Relative: 10 %
Neutro Abs: 16.4 10*3/uL — ABNORMAL HIGH (ref 1.7–7.7)
Neutro Abs: 15.6 10*3/uL — ABNORMAL HIGH (ref 1.7–7.7)
Neutrophils Relative %: 84 %
Neutrophils Relative %: 81 %
Platelets: 508 10*3/uL — ABNORMAL HIGH (ref 150–400)
Platelets: 593 10*3/uL — ABNORMAL HIGH (ref 150–400)
RBC: 2.26 MIL/uL — ABNORMAL LOW (ref 4.22–5.81)
RBC: 2.49 MIL/uL — ABNORMAL LOW (ref 4.22–5.81)
RDW: 13.7 % (ref 11.5–15.5)
RDW: 14.5 % (ref 11.5–15.5)
WBC: 19.5 10*3/uL — ABNORMAL HIGH (ref 4.0–10.5)
WBC: 19.3 10*3/uL — ABNORMAL HIGH (ref 4.0–10.5)
nRBC: 0 % (ref 0.0–0.2)
nRBC: 0 % (ref 0.0–0.2)

## 2020-07-25 LAB — GLUCOSE, CAPILLARY: Glucose-Capillary: 98 mg/dL (ref 70–99)

## 2020-07-25 LAB — MAGNESIUM: Magnesium: 1.9 mg/dL (ref 1.7–2.4)

## 2020-07-25 LAB — PREPARE RBC (CROSSMATCH)

## 2020-07-25 LAB — C-REACTIVE PROTEIN: CRP: 19.8 mg/dL — ABNORMAL HIGH (ref <1.0)

## 2020-07-25 LAB — PHOSPHORUS: Phosphorus: 3.7 mg/dL (ref 2.5–4.6)

## 2020-07-25 MED ORDER — POTASSIUM CHLORIDE CRYS ER 20 MEQ PO TBCR
40.0000 meq | EXTENDED_RELEASE_TABLET | Freq: Once | ORAL | Status: AC
  Administered 2020-07-25: 12:00:00 40 meq via ORAL
  Filled 2020-07-25: qty 2

## 2020-07-25 MED ORDER — MAGNESIUM SULFATE 2 GM/50ML IV SOLN
2.0000 g | Freq: Once | INTRAVENOUS | Status: AC
  Administered 2020-07-25: 17:00:00 2 g via INTRAVENOUS
  Filled 2020-07-25: qty 50

## 2020-07-25 NOTE — Progress Notes (Signed)
ID Brief Note  Started having low grade fevers, uptrending leukocytosis, CRP uptrending.  I ordered a chest xray this morning with  " Progressive patchy airspace disease both lung bases"   I am afraid he has not had good surgical control of his empyema and is showing signs of early sepsis. He is a poor surgical candidate per Sx  Goals of care discussion by primary team is on process for possible comfort care.   Repeat blood cultures ( one set from peripheral and one from central line)  Following  Odette Fraction, MD [facility]
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
## 2020-07-25 NOTE — Progress Notes (Addendum)
Palliative:  HPI: 39 year old man with past medical history significant for severe cognitive impairment, fetal alcohol syndrome, autism, seizures, lactose intolerance, GERD, and depression who presented to [facility] on 07/02/2020 following reported unwitnessed fall in shower found to be severely dehydrated and malnourished with 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 Patient's hospital course has been complicated by upper GI bleed s/p EGD with severe grade D erosive esophagitis and angiodysplasias treated with APC, refeeding syndrome, right sided empyema, E. 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 Now with increasing functional debility and failure to thrive. Palliative care was asked to aid in goals of care conversations.  I met today at Andres [name]'s bedside. He is severely contracted and lying in bed. He only opens eyes for a moment but makes no further attempts to interact with me. He has severe deconditioning and malnutrition along with multiple complicated health complications from his declined functional status. His condition has declined to a state that is expected to only lead to further complications and infections that I worry will cause him further pain and suffering.   I spoke with legal guardian, Andres [name], who shares with me that she "could not find" the paperwork that they require to pursue DNR. She further shares that they want to wait and "see how he does" before making any further decisions. I did explain to Andres [name] that even if they wish to continue interventions with antibiotics and medical treatments that it would still be very appropriate to put in place DNR for Andres [name]. Given his contractures and frailty it would be inappropriate to put Andres [name] through resuscitation efforts if his body declined to the point of cardiac and pulmonary arrest as this would only serve to cause him pain and suffering at end of life. Andres [name] shares  that we may write up our recommendations and have the medical providers sign for our medical recommendations. This will need to be scanned and emailed to [place] as she does not have a fax number currently working. Will work with attending team to coordinate.   All questions/concerns addressed. Emotional support. Discussed with Dr. Konrad Penta.   Exam: Minimally responsive at time of my visit. Does not have verbal response for me. Not following commands. Severe contractures and lying in tight fetal position. He does appear to be resting comfortably. No distress. Breathing regular, unlabored. Abd soft.   Plan: - Strongly recommend DNR.  - Recommend consideration of comfort measures.  - Working to send medical recommendations to legal guardian.   [place], NP Palliative Medicine Team Pager [phone] (Please see [email or web address] for schedule) Team Phone [phone]    Greater than 50%  of this time was spent counseling and coordinating care related to the above assessment and plan

## 2020-07-25 NOTE — Plan of Care (Signed)
Not progressing towards goals at this time due to altered mental status and ongoing confusion.   Problem: Education: Goal: Knowledge of General Education information will improve Description: Including pain rating scale, medication(s)/side effects and non-pharmacologic comfort measures Outcome: Not Progressing   Problem: Health Behavior/Discharge Planning: Goal: Ability to manage health-related needs will improve Outcome: Not Progressing   Problem: Clinical Measurements: Goal: Ability to maintain clinical measurements within normal limits will improve Outcome: Not Progressing Goal: Will remain free from infection Outcome: Not Progressing Goal: Diagnostic test results will improve Outcome: Not Progressing Goal: Respiratory complications will improve Outcome: Not Progressing Goal: Cardiovascular complication will be avoided Outcome: Not Progressing   Problem: Activity: Goal: Risk for activity intolerance will decrease Outcome: Not Progressing   Problem: Coping: Goal: Level of anxiety will decrease Outcome: Not Progressing   Problem: Elimination: Goal: Will not experience complications related to bowel motility Outcome: Not Progressing Goal: Will not experience complications related to urinary retention Outcome: Not Progressing   Problem: Pain Managment: Goal: General experience of comfort will improve Outcome: Not Progressing   Problem: Nutrition: Goal: Adequate nutrition will be maintained Outcome: Not Progressing

## 2020-07-26 DIAGNOSIS — Z7189 Other specified counseling: Secondary | ICD-10-CM | POA: Diagnosis not present

## 2020-07-26 DIAGNOSIS — J869 Pyothorax without fistula: Secondary | ICD-10-CM | POA: Diagnosis not present

## 2020-07-26 DIAGNOSIS — Q860 Fetal alcohol syndrome (dysmorphic): Secondary | ICD-10-CM | POA: Diagnosis not present

## 2020-07-26 DIAGNOSIS — R6250 Unspecified lack of expected normal physiological development in childhood: Secondary | ICD-10-CM | POA: Diagnosis not present

## 2020-07-26 DIAGNOSIS — Z515 Encounter for palliative care: Secondary | ICD-10-CM | POA: Diagnosis not present

## 2020-07-26 DIAGNOSIS — R627 Adult failure to thrive: Secondary | ICD-10-CM | POA: Diagnosis not present

## 2020-07-26 LAB — COMPREHENSIVE METABOLIC PANEL
ALT: 66 U/L — ABNORMAL HIGH (ref 0–44)
AST: 53 U/L — ABNORMAL HIGH (ref 15–41)
Albumin: 1.1 g/dL — ABNORMAL LOW (ref 3.5–5.0)
Alkaline Phosphatase: 78 U/L (ref 38–126)
Anion gap: 8 (ref 5–15)
BUN: 12 mg/dL (ref 6–20)
CO2: 28 mmol/L (ref 22–32)
Calcium: 7.6 mg/dL — ABNORMAL LOW (ref 8.9–10.3)
Chloride: 96 mmol/L — ABNORMAL LOW (ref 98–111)
Creatinine, Ser: 0.38 mg/dL — ABNORMAL LOW (ref 0.61–1.24)
GFR, Estimated: >60 mL/min (ref >60)
Glucose, Bld: 115 mg/dL — ABNORMAL HIGH (ref 70–99)
Potassium: 3.5 mmol/L (ref 3.5–5.1)
Sodium: 132 mmol/L — ABNORMAL LOW (ref 135–145)
Total Bilirubin: 0.2 mg/dL — ABNORMAL LOW (ref 0.3–1.2)
Total Protein: 5.3 g/dL — ABNORMAL LOW (ref 6.5–8.1)

## 2020-07-26 LAB — CBC WITH DIFFERENTIAL/PLATELET
Abs Immature Granulocytes: 0.57 10*3/uL — ABNORMAL HIGH (ref 0.00–0.07)
Basophils Absolute: 0 10*3/uL (ref 0.0–0.1)
Basophils Relative: 0 %
Eosinophils Absolute: 0.1 10*3/uL (ref 0.0–0.5)
Eosinophils Relative: 0 %
HCT: 24 % — ABNORMAL LOW (ref 39.0–52.0)
Hemoglobin: 7.8 g/dL — ABNORMAL LOW (ref 13.0–17.0)
Immature Granulocytes: 3 %
Lymphocytes Relative: 6 %
Lymphs Abs: 1.1 10*3/uL (ref 0.7–4.0)
MCH: 29.4 pg (ref 26.0–34.0)
MCHC: 32.5 g/dL (ref 30.0–36.0)
MCV: 90.6 fL (ref 80.0–100.0)
Monocytes Absolute: 1.7 10*3/uL — ABNORMAL HIGH (ref 0.1–1.0)
Monocytes Relative: 9 %
Neutro Abs: 16.5 10*3/uL — ABNORMAL HIGH (ref 1.7–7.7)
Neutrophils Relative %: 82 %
Platelets: 630 10*3/uL — ABNORMAL HIGH (ref 150–400)
RBC: 2.65 MIL/uL — ABNORMAL LOW (ref 4.22–5.81)
RDW: 14.2 % (ref 11.5–15.5)
WBC: 20 10*3/uL — ABNORMAL HIGH (ref 4.0–10.5)
nRBC: 0 % (ref 0.0–0.2)

## 2020-07-26 LAB — PHOSPHORUS: Phosphorus: 2.6 mg/dL (ref 2.5–4.6)

## 2020-07-26 LAB — PROTIME-INR
INR: 1.4 — ABNORMAL HIGH (ref 0.8–1.2)
Prothrombin Time: 16.8 seconds — ABNORMAL HIGH (ref 11.4–15.2)

## 2020-07-26 LAB — MAGNESIUM: Magnesium: 1.8 mg/dL (ref 1.7–2.4)

## 2020-07-26 MED ORDER — MAGNESIUM SULFATE 4 GM/100ML IV SOLN
4.0000 g | Freq: Once | INTRAVENOUS | Status: AC
  Administered 2020-07-26: 4 g via INTRAVENOUS
  Filled 2020-07-26: qty 100

## 2020-07-26 MED ORDER — POTASSIUM PHOSPHATES 15 MMOLE/5ML IV SOLN
30.0000 mmol | Freq: Once | INTRAVENOUS | Status: AC
  Administered 2020-07-26: 30 mmol via INTRAVENOUS
  Filled 2020-07-26: qty 10

## 2020-07-26 MED ORDER — COLLAGENASE 250 UNIT/GM EX OINT
TOPICAL_OINTMENT | Freq: Every day | CUTANEOUS | Status: DC
  Administered 2020-08-06 – 2020-08-12 (×2): 1 via TOPICAL
  Filled 2020-07-26 (×4): qty 30

## 2020-07-26 MED ORDER — POTASSIUM CHLORIDE CRYS ER 20 MEQ PO TBCR
40.0000 meq | EXTENDED_RELEASE_TABLET | Freq: Once | ORAL | Status: AC
  Administered 2020-07-26: 40 meq via ORAL
  Filled 2020-07-26: qty 2
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
## 2020-07-26 NOTE — Progress Notes (Signed)
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 Patient's hospital course has been complicated by upper GI bleed s/p EGD with severe grade D erosive esophagitis and angiodysplasias treated with APC, refeeding syndrome, right sided empyema, E. 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 Now with increasing functional debility and failure to thrive. Palliative care was asked to aid in goals of care conversations.  I have met at Mr. Rathert' bedside. No visitors present. Her is more alert than he was when I originally met him yesterday. He did eat 90% of his breakfast. He did make brief attempts to verbalize to me and I did understand "eat" and that he desired a snack (notified RN/NT to assist). Otherwise he seemed to attempt to verbalize by repeating the last word I said to him but could not obtain any further feedback from him. RN reports that he seems to be uncomfortable and rotates to his side especially to the right which is only worsening his wounds.   I discussed with Dr. Evette Doffing and Dr. Konrad Penta. They agree with recommendation for DNR and would also recommend comfort care. They express that he has shown no significant improvement for > 3 weeks of aggressive treatment. He has ongoing empyema and not thought to be able to tolerate surgical intervention due to frailty. We wrote our recommendations and I signed and this was emailed to legal guardian. I received message that guardian is out of office until Monday. I did not contact emergency guardian as I do not feel they would be willing to make these changes at this time given  my conversation yesterday. Recommend prompt follow up with guardian Monday.   Exam: Alert. Verbalizes one word only at times. Unable to answer questions consistently. Severe contractures in all extremities. No signs of discomfort at this time. Breathing regular, unlabored. Abd soft, flat. Wounds not examined by me today.   Plan: - Strongly recommend DNR.  - Recommend consideration of comfort measures.  - Medical recommendations sent to legal guardian.   [place], NP Palliative Medicine Team Pager [phone] (Please see [email or web address] for schedule) Team Phone [phone]    Greater than 50%  of this time was spent counseling and coordinating care related to the above assessment and plan

## 2020-07-26 NOTE — Progress Notes (Signed)
Orthopedic Tech Progress Note Patient Details:  Andres [name] 28-May-1981 [id number] Called in order to [employer] for a RESTING HAND SPLINT and a COCK UP PREFAB SPLINT  Patient ID: Andres [name], male   DOB: [birth year], 39 y.o.   MRN: [id number]   [name] 07/26/2020, 3:01 PM

## 2020-07-26 NOTE — Progress Notes (Addendum)
Occupational Therapy Treatment Patient Details Name: Andres [name] MRN: [id number] DOB: 14-May-1981 Today's Date: 07/26/2020    History of present illness 39 y.o. male presenting s/p unwhitnessed fall at home in the shower and changes from baseline including drooling. CXR (-), XR pelvis (-), and CT head/spine (-) for acute findings. EEG (-) for seizures. CT abdomen (+) SBO vs. illeus s/p  NG tube placement. Suspected fall 2/2 dehydration from diarrhea. Hosptial course complicated Pneumonia with pleural effusion, E coli bacteremia, upper GI bleed, and LUE flaccidity with concern for R MCA vs. TIA 2/2 complex medical Hx. vs. LUE/plexus/mechanical etiology. Imaging (-) for acute findings. Patient with continued LUE weakness. PMHx significant for fetal alcohol symdrome with cognitive impairment, Autism, non-verbal, and behaviors.   OT comments  Patient met lying supine in bed asleep requiring increased time to arouse. OT treatment session with focus on splinting needs, establishment of POC based on treatment team recommendations, and positioning. Patient allowing more PROM at this time with spasticity remaining a barrier to achievement of functional position in BLE. LUE remains flaccid. Patient found to be incontinent of bowel requiring Total A for hygiene/clothing management at bed level. Noted wound at sacrum without mepilex boarder. RN notified. Patient positioned in supine with pillow between knees to prevent scissoring, towel rolls at posterior knees bilaterally to prevent further skin breakdown and LUE supported on pillow. Recommendation for wound care re-consult to further assess sacral wound. Patient would also benefit from air mattress and prevalon boots. OT to further assess splinting needs after follow-up with palliative care and MD. If patient is not to transition to comfort care, recommendation for L wrist cock-up splint (prefab) and R resting hand splint for night use. OT will continue to  follow.   Addendum: Palliative does not believe that patient's guardian will select comfort care measures. OT to proceed with assessment of splinting needs. MD messaged for orders for splints as detailed above.    Follow Up Recommendations  SNF    Equipment Recommendations  Hospital bed (air bed/matress, hoyer lift)    Recommendations for Other Services Other (comment) (Wound consult)    Precautions / Restrictions Precautions Precautions: Fall Restrictions Weight Bearing Restrictions: No       Mobility Bed Mobility Overal bed mobility: Needs Assistance Bed Mobility: Rolling Rolling: Max assist         General bed mobility comments: Max A for rolling R<>L. Total A +2 for supine scoot toward HOB.  Transfers Overall transfer level: Needs assistance               General transfer comment: Bed level activity only this date for assessment of splinting needs.    Balance                                           ADL either performed or assessed with clinical judgement   ADL                               Toileting- Clothing Manipulation and Hygiene: Total assistance;Bed level Toileting - Clothing Manipulation Details (indicate cue type and reason): Total A for hygiene/clothing management at bed level. Incontinent x2.             Vision       Perception     Praxis  Cognition Arousal/Alertness: Awake/alert Behavior During Therapy: Flat affect Overall Cognitive Status: History of cognitive impairments - at baseline                                 General Comments: Patient more engaging with this therapist today. States "yes", "no", "food" and "I can't" appropriately during session.        Exercises     Shoulder Instructions       General Comments Noted wound on sacrum without mepilex boarder. Mepilex boarder on R hip also soiled with BM. RN notified. Several wounds along BLE including feet with  swelling L>R noted. Patient positioned in supine with towel rolls at knees, LUE supported on pillow, and cervical neck supported in midline position in prep for feeding task.    Pertinent Vitals/ Pain       Pain Assessment: Faces Faces Pain Scale: Hurts little more Pain Location: BLE and RUE during ROM and passive stretching. Pain Descriptors / Indicators: Grimacing;Guarding;Discomfort Pain Intervention(s): Limited activity within patient's tolerance;Monitored during session;Repositioned  Home Living                                          Prior Functioning/Environment              Frequency  Min 1X/week        Progress Toward Goals  OT Goals(current goals can now be found in the care plan section)  Progress towards OT goals: Progressing toward goals  Acute Rehab OT Goals Patient Stated Goal: Unable to state.  OT Goal Formulation: Patient unable to participate in goal setting Time For Goal Achievement: 08/07/20 ADL Goals Additional ADL Goal #1: Staff will perform PROM to BUE/BLE to prevent further contractures and decrease risk of skin breakdown. Additional ADL Goal #2: Staff will reposition patient every 2 hours to maintain skin integrity and prevent further skin breakdwon.  Plan Discharge plan remains appropriate;Frequency remains appropriate    Co-evaluation                 AM-PAC OT "6 Clicks" Daily Activity     Outcome Measure   Help from another person eating meals?: A Lot Help from another person taking care of personal grooming?: Total Help from another person toileting, which includes using toliet, bedpan, or urinal?: Total Help from another person bathing (including washing, rinsing, drying)?: Total Help from another person to put on and taking off regular upper body clothing?: Total Help from another person to put on and taking off regular lower body clothing?: Total 6 Click Score: 7    End of Session    OT Visit Diagnosis:  Unsteadiness on feet (R26.81);Muscle weakness (generalized) (M62.81);Pain;Adult, failure to thrive (R62.7);Other (comment) (Poor skin integrity with multiple wounds.) Pain - Right/Left:  (Bilateral) Pain - part of body: Leg;Ankle and joints of foot   Activity Tolerance Patient tolerated treatment well   Patient Left in bed;with call bell/phone within reach;with nursing/sitter in room   Nurse Communication Other (comment) (Need for wound care consult, air bed/matress, and prevalon boots.)        Time: 2229-7989 OT Time Calculation (min): 23 min  Charges: OT General Charges $OT Visit: 1 Visit OT Treatments $Therapeutic Activity: 23-37 mins  Mikela Senn H. OTR/L Supplemental OT, Department of rehab services [phone]   Hawley Pavia R H. 07/26/2020,  10:08 AM

## 2020-07-26 NOTE — Progress Notes (Signed)
Paged on call MD regarding clarification for blood culture orders with comment reading "draw one from peripheral line and one from picc" - pt has no peripheral line, and it appears cultures were done yesterday. Clarify whether to draw from PICC vs peripheral venipuncture, or order additional set of cultures to accommodate both methods. Spoke with Dr Kathrynn Speed who says "It looks like they were already drawn, I and my colleagues have reviewed the notes and orders to try and piece together what that order was meaning - I think ID put it in - let's wait until morning and I will see if rounding day team can clarify what we need." Paged lab to notify of this update.
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
## 2020-07-26 NOTE — Plan of Care (Signed)
  Problem: Clinical Measurements: Goal: Ability to maintain clinical measurements within normal limits will improve Outcome: Not Progressing Goal: Cardiovascular complication will be avoided Outcome: Not Progressing   Problem: Activity: Goal: Risk for activity intolerance will decrease Outcome: Not Progressing   Problem: Elimination: Goal: Will not experience complications related to bowel motility Outcome: Not Progressing Goal: Will not experience complications related to urinary retention Outcome: Not Progressing

## 2020-07-26 NOTE — Consult Note (Addendum)
WOC Nurse wound follow up Patient receiving care in [facility] [id number] Wound type: Left and right hip wounds Measurement: Right hip wound 5 cm x 6 cm  Left hip wound 6 cm x 5 cm Wound bed: Left hip wound is 20% pink, 80% red and black. Right hip wound is 100% black Both wounds are unstageable.  Drainage (amount, consistency, odor) No drainage on either wound Dressing procedure/placement/frequency: Apply Santyl to left and right hip wounds in a nickel thick layer. Cover with a saline moistened gauze, then dry gauze or ABD pad.  Change daily. Mattress replacement has been ordered three times. Will [place] bedside RN and personally make the request. Per bedside nurse air mattress is not available at this time but should be received once available.  WOC will follow again on Monday and possibly start Hydrotherapy.  Monitor the wound area(s) for worsening of condition such as: Signs/symptoms of infection, increase in size, development of or worsening of odor, development of pain, or increased pain at the affected locations.   Notify the medical team if any of these develop.  Thank you for the consult. WOC nurse will not follow at this time.   Please re-consult the WOC team if needed.  Renaldo Reel Katrinka Blazing, MSN, RN, CMSRN, Angus Seller, [facility] Wound Treatment Associate Pager [phone]

## 2020-07-27 DIAGNOSIS — Z515 Encounter for palliative care: Secondary | ICD-10-CM | POA: Diagnosis not present

## 2020-07-27 DIAGNOSIS — Z7189 Other specified counseling: Secondary | ICD-10-CM | POA: Diagnosis not present

## 2020-07-27 DIAGNOSIS — J90 Pleural effusion, not elsewhere classified: Secondary | ICD-10-CM | POA: Diagnosis not present

## 2020-07-27 DIAGNOSIS — D638 Anemia in other chronic diseases classified elsewhere: Secondary | ICD-10-CM

## 2020-07-27 DIAGNOSIS — J168 Pneumonia due to other specified infectious organisms: Secondary | ICD-10-CM | POA: Diagnosis not present

## 2020-07-27 DIAGNOSIS — R7881 Bacteremia: Secondary | ICD-10-CM | POA: Diagnosis not present

## 2020-07-27 DIAGNOSIS — B9620 Unspecified Escherichia coli [E. coli] as the cause of diseases classified elsewhere: Secondary | ICD-10-CM | POA: Diagnosis not present

## 2020-07-27 DIAGNOSIS — J869 Pyothorax without fistula: Secondary | ICD-10-CM | POA: Diagnosis not present

## 2020-07-27 LAB — BLOOD CULTURE ID PANEL (REFLEXED) - BCID2

## 2020-07-27 LAB — CBC WITH DIFFERENTIAL/PLATELET
Abs Immature Granulocytes: 0.29 10*3/uL — ABNORMAL HIGH (ref 0.00–0.07)
Abs Immature Granulocytes: 0.71 10*3/uL — ABNORMAL HIGH (ref 0.00–0.07)
Basophils Absolute: 0 10*3/uL (ref 0.0–0.1)
Basophils Absolute: 0.1 10*3/uL (ref 0.0–0.1)
Basophils Relative: 0 %
Basophils Relative: 0 %
Eosinophils Absolute: 0.1 10*3/uL (ref 0.0–0.5)
Eosinophils Absolute: 0.1 10*3/uL (ref 0.0–0.5)
Eosinophils Relative: 1 %
Eosinophils Relative: 1 %
HCT: 20.1 % — ABNORMAL LOW (ref 39.0–52.0)
HCT: 25.9 % — ABNORMAL LOW (ref 39.0–52.0)
Hemoglobin: 6.4 g/dL — CL (ref 13.0–17.0)
Hemoglobin: 8.7 g/dL — ABNORMAL LOW (ref 13.0–17.0)
Immature Granulocytes: 2 %
Immature Granulocytes: 4 %
Lymphocytes Relative: 7 %
Lymphocytes Relative: 8 %
Lymphs Abs: 1 10*3/uL (ref 0.7–4.0)
Lymphs Abs: 1.4 10*3/uL (ref 0.7–4.0)
MCH: 29.5 pg (ref 26.0–34.0)
MCH: 30.5 pg (ref 26.0–34.0)
MCHC: 31.8 g/dL (ref 30.0–36.0)
MCHC: 33.6 g/dL (ref 30.0–36.0)
MCV: 92.6 fL (ref 80.0–100.0)
MCV: 90.9 fL (ref 80.0–100.0)
Monocytes Absolute: 1.4 10*3/uL — ABNORMAL HIGH (ref 0.1–1.0)
Monocytes Absolute: 1.7 10*3/uL — ABNORMAL HIGH (ref 0.1–1.0)
Monocytes Relative: 10 %
Monocytes Relative: 9 %
Neutro Abs: 11.7 10*3/uL — ABNORMAL HIGH (ref 1.7–7.7)
Neutro Abs: 15.2 10*3/uL — ABNORMAL HIGH (ref 1.7–7.7)
Neutrophils Relative %: 80 %
Neutrophils Relative %: 78 %
Platelets: 500 10*3/uL — ABNORMAL HIGH (ref 150–400)
Platelets: 535 10*3/uL — ABNORMAL HIGH (ref 150–400)
RBC: 2.17 MIL/uL — ABNORMAL LOW (ref 4.22–5.81)
RBC: 2.85 MIL/uL — ABNORMAL LOW (ref 4.22–5.81)
RDW: 14.1 % (ref 11.5–15.5)
RDW: 13.6 % (ref 11.5–15.5)
WBC: 14.6 10*3/uL — ABNORMAL HIGH (ref 4.0–10.5)
WBC: 19.2 10*3/uL — ABNORMAL HIGH (ref 4.0–10.5)
nRBC: 0 % (ref 0.0–0.2)
nRBC: 0 % (ref 0.0–0.2)

## 2020-07-27 LAB — MAGNESIUM: Magnesium: 2 mg/dL (ref 1.7–2.4)

## 2020-07-27 LAB — POTASSIUM: Potassium: 4.6 mmol/L (ref 3.5–5.1)

## 2020-07-27 LAB — PHOSPHORUS: Phosphorus: 3.4 mg/dL (ref 2.5–4.6)

## 2020-07-27 LAB — PREPARE RBC (CROSSMATCH)

## 2020-07-27 MED ORDER — MAGNESIUM SULFATE IN D5W 1-5 GM/100ML-% IV SOLN
1.0000 g | Freq: Once | INTRAVENOUS | Status: AC
  Administered 2020-07-27: 1 g via INTRAVENOUS
  Filled 2020-07-27: qty 100

## 2020-07-27 MED ORDER — SODIUM CHLORIDE 0.9% IV SOLUTION: Freq: Once | INTRAVENOUS | Status: AC

## 2020-07-27 NOTE — Progress Notes (Signed)
PHARMACY - PHYSICIAN COMMUNICATION CRITICAL VALUE ALERT - BLOOD CULTURE IDENTIFICATION (BCID)  Andres [name] is an 40 y.o. male with PNA/empyema, E. Coli bacteremia on Augmentin.    Assessment:   Blood culture 12/30 growing Methicillin resistant Staphylococcus epidermidis in aerobic bottle.  Staph epi is commonly a contaminant but only 1 blood culture drawn and noted concerns from ID about persistently elevated WBC.     Name of physician (or Provider) Contacted: Dr. Laddie Aquas  Current antibiotics: Augmentin  Changes to prescribed antibiotics recommended:  Suggest contacting ID for guidance   Results for orders placed or performed during the hospital encounter of 07/01/20  Blood Culture ID Panel (Reflexed) (Collected: 07/25/2020 10:00 PM)  Result Value Ref Range   Enterococcus faecalis NOT DETECTED NOT DETECTED   Enterococcus Faecium NOT DETECTED NOT DETECTED   Listeria monocytogenes NOT DETECTED NOT DETECTED   Staphylococcus species DETECTED (A) NOT DETECTED   Staphylococcus aureus (BCID) NOT DETECTED NOT DETECTED   Staphylococcus epidermidis DETECTED (A) NOT DETECTED   Staphylococcus lugdunensis NOT DETECTED NOT DETECTED   Streptococcus species NOT DETECTED NOT DETECTED   Streptococcus agalactiae NOT DETECTED NOT DETECTED   Streptococcus pneumoniae NOT DETECTED NOT DETECTED   Streptococcus pyogenes NOT DETECTED NOT DETECTED   A.calcoaceticus-baumannii NOT DETECTED NOT DETECTED   Bacteroides fragilis NOT DETECTED NOT DETECTED   Enterobacterales NOT DETECTED NOT DETECTED   Enterobacter cloacae complex NOT DETECTED NOT DETECTED   Escherichia coli NOT DETECTED NOT DETECTED   Klebsiella aerogenes NOT DETECTED NOT DETECTED   Klebsiella oxytoca NOT DETECTED NOT DETECTED   Klebsiella pneumoniae NOT DETECTED NOT DETECTED   Proteus species NOT DETECTED NOT DETECTED   Salmonella species NOT DETECTED NOT DETECTED   Serratia marcescens NOT DETECTED NOT DETECTED   Haemophilus influenzae  NOT DETECTED NOT DETECTED   Neisseria meningitidis NOT DETECTED NOT DETECTED   Pseudomonas aeruginosa NOT DETECTED NOT DETECTED   Stenotrophomonas maltophilia NOT DETECTED NOT DETECTED   Candida albicans NOT DETECTED NOT DETECTED   Candida auris NOT DETECTED NOT DETECTED   Candida glabrata NOT DETECTED NOT DETECTED   Candida krusei NOT DETECTED NOT DETECTED   Candida parapsilosis NOT DETECTED NOT DETECTED   Candida tropicalis NOT DETECTED NOT DETECTED   Cryptococcus neoformans/gattii NOT DETECTED NOT DETECTED   Methicillin resistance mecA/C DETECTED (A) NOT DETECTED    [name] 07/27/2020  7:40 AM
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
## 2020-07-27 NOTE — Progress Notes (Signed)
Paged on call md about hgb. Await page back.

## 2020-07-27 NOTE — Progress Notes (Signed)
1/2 BCx noted positive for MRSE (coag negative staph).  Would treat as a contaminant- he has been afebrile, WBC is decreasing despite not being treated for this.  Available as needed.

## 2020-07-27 NOTE — Progress Notes (Addendum)
Palliative Medicine Inpatient Follow Up Note  HPI: Per intake H&P -->  Mr. Montagna is a 40 year old man with past medical history significant for severe cognitive impairment, fetal alcohol syndrome, autism, seizures, lactose intolerance, GERD, and depression who presented to [facility] on 07/02/2020 following reported unwitnessed fall in shower found to be severely dehydrated and malnourished with 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 Patient's hospital course has been complicated by upper GI bleed s/p EGD with severe grade D erosive esophagitis and angiodysplasias treated with APC, refeeding syndrome, right sided empyema, E. 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  Palliative care was asked to aid in goals of care conversations.  Today's Discussion (07/27/2020): Chart reviewed. I met with Herbie Baltimore at bedside. He appears more alert today. Per his nursing aid when he first came into the hospital he was able to stand, walk, and feed himself. He is noted to have declined over the past few weeks to the point where he is more contracted and requires a total level of care.  Per Palliative conversations with his guardian (on 12/30) she opted to continue with full measures. She requested additional documentation in order for the course to shift.   Risks of things like resuscitation have been explained to her in detail as they result in dismal outcomes in patients like Mcguire. Primary team & Palliative team had made recommendations towards DNR and comfort care - documentation was sent in support of this. We are awaiting a response from [place] (Legal Guardian) on Monday.   Objective Assessment: Vital Signs Vitals:   07/27/20 1133 07/27/20 1300  BP: 106/64 118/76  Pulse: 93 98  Resp: 18 18  Temp: 99.3 F (37.4 C) 98.5 F (36.9 C)  SpO2: 100% 100%    Intake/Output Summary (Last 24 hours) at 07/27/2020 1457 Last data filed at  07/27/2020 1300 Gross per 24 hour  Intake 520 ml  Output -  Net 520 ml   Last Weight  Most recent update: 07/25/2020  3:52 AM   Weight  46.2 kg (101 lb 13.6 oz)           Gen:  Frail AA M  HEENT: moist mucous membranes CV: Irregular rate and rhythm  PULM: On 2LPM [place] ABD: soft/nontender  EXT: Pedal edema, (+) contractures Neuro: Alert and oriented x1  SUMMARY OF RECOMMENDATIONS   Full Code / Full Scope of Treatment  Plan to complete MOST form the next time Clyde Canterbury comes in  Per review of the "[employer]hip [employer] [place]; [employer]" Appendix O there is an Electronics engineer for "natural death in the absence of declaration". This has been printed and placed on the chart for primary team review and possibly completion  Further information sent to [facility] yesterday regarding recommendations for DNAR code status and comfort care  Will follow up with Amelia on Monday as she is out of the office until then in the setting of the holidy  Time Spent: 25 Greater than 50% of the time was spent in counseling and coordination of care ______________________________________________________________________________________ [place] Team Team Cell Phone: [phone] Please utilize secure chat with additional questions, if there is no response within 30 minutes please call the above phone number  Palliative Medicine Team providers are available by phone from 7am to 7pm daily and can be reached through the team cell phone.  Should this patient require assistance outside of these hours,  please call the patient's attending physician.

## 2020-07-28 DIAGNOSIS — J869 Pyothorax without fistula: Secondary | ICD-10-CM | POA: Diagnosis not present

## 2020-07-28 DIAGNOSIS — J168 Pneumonia due to other specified infectious organisms: Secondary | ICD-10-CM | POA: Diagnosis not present

## 2020-07-28 DIAGNOSIS — Z7189 Other specified counseling: Secondary | ICD-10-CM | POA: Diagnosis not present

## 2020-07-28 DIAGNOSIS — J90 Pleural effusion, not elsewhere classified: Secondary | ICD-10-CM | POA: Diagnosis not present

## 2020-07-28 DIAGNOSIS — Z515 Encounter for palliative care: Secondary | ICD-10-CM | POA: Diagnosis not present

## 2020-07-28 DIAGNOSIS — B9620 Unspecified Escherichia coli [E. coli] as the cause of diseases classified elsewhere: Secondary | ICD-10-CM | POA: Diagnosis not present

## 2020-07-28 LAB — CBC WITH DIFFERENTIAL/PLATELET
Basophils Absolute: 0 10*3/uL (ref 0.0–0.1)
Basophils Relative: 0 %
Eosinophils Absolute: 0 10*3/uL (ref 0.0–0.5)
Eosinophils Relative: 0 %
HCT: 35.5 % — ABNORMAL LOW (ref 39.0–52.0)
Hemoglobin: 11.8 g/dL — ABNORMAL LOW (ref 13.0–17.0)
Lymphocytes Relative: 10 %
Lymphs Abs: 1.3 10*3/uL (ref 0.7–4.0)
MCH: 30.3 pg (ref 26.0–34.0)
MCHC: 33.2 g/dL (ref 30.0–36.0)
MCV: 91.3 fL (ref 80.0–100.0)
Monocytes Absolute: 1.3 10*3/uL — ABNORMAL HIGH (ref 0.1–1.0)
Monocytes Relative: 10 %
Neutro Abs: 11.9 10*3/uL — ABNORMAL HIGH (ref 1.7–7.7)
Neutrophils Relative %: 80 %
Platelets: 595 10*3/uL — ABNORMAL HIGH (ref 150–400)
RBC: 3.89 MIL/uL — ABNORMAL LOW (ref 4.22–5.81)
RDW: 13.8 % (ref 11.5–15.5)
WBC: 15.2 10*3/uL — ABNORMAL HIGH (ref 4.0–10.5)

## 2020-07-28 LAB — CULTURE, BLOOD (SINGLE): Special Requests: ADEQUATE

## 2020-07-28 LAB — BPAM RBC
Blood Product Expiration Date: 202201042359
Blood Product Expiration Date: 202201182359
ISSUE DATE / TIME: 202112301141
ISSUE DATE / TIME: 202201011220
Unit Type and Rh: 600
Unit Type and Rh: 600

## 2020-07-28 LAB — TYPE AND SCREEN
ABO/RH(D): A NEG
Antibody Screen: NEGATIVE
Unit division: 0
Unit division: 0

## 2020-07-28 LAB — POTASSIUM: Potassium: 3.9 mmol/L (ref 3.5–5.1)

## 2020-07-28 LAB — MAGNESIUM: Magnesium: 1.7 mg/dL (ref 1.7–2.4)

## 2020-07-28 LAB — PHOSPHORUS: Phosphorus: 2.7 mg/dL (ref 2.5–4.6)

## 2020-07-28 NOTE — Plan of Care (Signed)
  Problem: Education: Goal: Knowledge of General Education information will improve Description Including pain rating scale, medication(s)/side effects and non-pharmacologic comfort measures Outcome: Progressing   

## 2020-07-28 NOTE — Progress Notes (Signed)
Palliative Medicine Inpatient Follow Up Note  HPI: Per intake H&P -->  Mr. Printup is a 40 year old man with past medical history significant for severe cognitive impairment, fetal alcohol syndrome, autism, seizures, lactose intolerance, GERD, and depression who presented to [facility] on 07/02/2020 following reported unwitnessed fall in shower found to be severely dehydrated and malnourished with 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 Patient's hospital course has been complicated by upper GI bleed s/p EGD with severe grade D erosive esophagitis and angiodysplasias treated with APC, refeeding syndrome, right sided empyema, E. 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  Palliative care was asked to aid in goals of care conversations.  Today's Discussion (07/28/2020): Chart reviewed. I met with Herbie Baltimore at bedside. He appears more alert today. Per his nursing aid he is eating robust amounts. Patients bedside RN, Langley Gauss and nursing aid [place] and I all discussed the incongruence of his prior physical state as opposed to now. Attempt was made by staff to get patient up - he was a total life to the chair. He is now more able to assist in feeding himself but mobility will continue to be a barrier.   I was able this morning to call the guardian office. I spoke to [employer]. He states that he is awaiting supporting documents from [place] to advance the case to the "Human Rights Committee" which will then determine th appropriate next steps to take regarding code status and level of care. Aaron Edelman shares that Clyde Canterbury did not send any additional documents on Friday which is when the email from the medical team was sent. He requests that we check in tomorrow for a status update.   Objective Assessment: Vital Signs Vitals:   07/27/20 2332 07/28/20 0803  BP: 126/83 112/82  Pulse: (!) 115 (!) 109  Resp: 18   Temp: 98.2 F (36.8 C) 97.9 F (36.6  C)  SpO2: 99% 98%    Intake/Output Summary (Last 24 hours) at 07/28/2020 1249 Last data filed at 07/27/2020 1507 Gross per 24 hour  Intake 595 ml  Output --  Net 595 ml   Last Weight  Most recent update: 07/25/2020  3:52 AM   Weight  46.2 kg (101 lb 13.6 oz)           Gen:  Frail AA M  HEENT: moist mucous membranes CV: Irregular rate and rhythm  PULM: On 2LPM [place] ABD: soft/nontender  EXT: Pedal edema, (+) contractures Neuro: Alert and oriented x1  SUMMARY OF RECOMMENDATIONS   Full Code / Full Scope of Treatment  Plan to complete MOST form the next time Clyde Canterbury comes in  Per review of the "Guardianship [place]; [employer]" Appendix O there is an Electronics engineer for "natural death in the absence of declaration". This has been printed and placed on the chart for primary team review and possibly completion  Further information sent to [facility] on 12/31 regarding recommendations for DNAR code status and comfort care.    Will follow up with Amelia on Monday as she is out of the office until then in the setting of the holiday  Time Spent: 25 Greater than 50% of the time was spent in counseling and coordination of care ______________________________________________________________________________________ [place] Team Team Cell Phone: [phone] Please utilize secure chat with additional questions, if there is no response within 30 minutes please call the above phone number  Palliative  Medicine Team providers are available by phone from 7am to 7pm daily and can be reached through the team cell phone.  Should this patient require assistance outside of these hours, please call the patient's attending physician.

## 2020-07-28 NOTE — Progress Notes (Addendum)
Subjective:   Patient sitting in bed and answering simple questions this morning.  States he feels fine.  Denies pain.  Stating he is hungry.  Objective:  Vital signs in last 24 hours: Vitals:   07/27/20 1300 07/27/20 1507 07/27/20 1949 07/27/20 2332  BP: 118/76 113/66 111/74 126/83  Pulse: 98 95 (!) 105 (!) 115  Resp: 18 18 18 18   Temp: 98.5 F (36.9 C) 98.1 F (36.7 C) 98.5 F (36.9 C) 98.2 F (36.8 C)  TempSrc: Oral Oral Oral Oral  SpO2: 100% 100% 100% 99%  Weight:      Height:       Physical exam General: Patient appears thin and malnourished. Resting comfortably in no acute distress. Respiratory: No tachypnea. No increased work of breathing on room air.  Cardiovascular: Rate is normal, rhythm is regular. No murmurs, rubs or gallops.  Musculoskeletal: Continues to have flaccid paralysis of LUE and contractures of the remaining extremities. Moves RUE but not other extremities on exam.  Neurological: Patient is more awake, alert, and intermittently follows simple commands and does speak a few words.  Skin: Blistering of bilateral toes  Assessment/Plan:  Principal Problem:   Empyema (HCC) Active Problems:   Pressure ulcer of sacrum   Ogilvie's syndrome   Protein-calorie malnutrition, severe   Refeeding syndrome   Pneumonia of right lung due to infectious organism   Bacteremia   Spastic quadriparesis (HCC)   Contractures involving both knees  Andres [name] is a 40 year old man with past medical history significant for severe cognitive impairment, fetal alcohol syndrome, autism, seizures, lactose intolerance, GERD, and depression who presented to [facility] on 07/02/2020 following reported unwitnessed fall in shower found to be severely dehydrated and malnourished with 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 Patient's hospital course has been complicated by upper GI bleed s/p EGD with severe grade D erosive esophagitis and angiodysplasias treated with APC, refeeding syndrome,  right sided empyema, E. 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   Right-sided pneumonia with loculated pleural effusion E. Coli / MRSE bacteremia Patient remains hemodynamically stable.  Continuing Augmentin.  Prognosis remains poor due to difficulty in pursuing source control of the underlying infection.  Palliative care continues to follow with ongoing discussions on goals of care and code status with the legal guardian and supervisors.  Physician recommendation for DNR status and comfort care has been signed.  - Palliative care and ID on board, appreciate recommendations - Continue Augmentin 875mg  twice daily, discontinue on 08/27/2019   Sinus Tachycardia, Improving Patient intermittently tachycardic. Patient bradycardic at baseline. Likely due to acute illness and dehydration. - Continue oral hydration    Refeeding syndrome, Improving Severe malnutrition, Persistent  Chronic intestinal pseudo-obstruction Electrolytes stable today. Concern for refeeding syndrome given malnourished state although difficult to distinguish from malabsorption in the setting of Olgilvie's syndrome / ileocolonic anastamosis.  - Check electrolytes daily for now - RD following, appreciate recommendations - Continue thiamine 100mg  daily  - RD following, appreciate recommendations   -Snacks TID   -Continue magic cup three times daily with meals   -Continue MVI with minerals daily and thiamine daily   -Boost and lactaid three times daily  Normocytic anemia due to chronic disease Hemoglobin 11.8.  Status post 4 units PRBC during hospital course.  Likely due to upper GI bleed given severe esophagitis and AVM's that were coagulated this admission.  Continue to hold aspirin. -Trend CBC - Transfuse if Hgb < 7.0  Diffuse Extremity Contractures Left upper extremity flaccid paralysis Consulted PM&R who recommend considering Valium vs.  Dantrolene with caution due to elevated LFT's; although will hold off for now given concerns of sedation and deteriorating clinical status  - Continue splinting at night   Acute Liver Failure, Improving - Less frequent Monitoring of CMP (needs CMP while on ABX) - Hold on further INR checks for now   GERD w/ severe erosive esophagitis, chronic - Encourage good fluid intake with medications - Protonix 40mg  twice daily, long-term  - Head of bed > 30 degrees at all times   Code status: Full code  Diet: Regular VTE ppx: None IVF: None   Prior to Admission Living Arrangement: Home with Caregiver Anticipated Discharge Location: Hospice (medically considered most appropriate at this point) vs. Long term care facility  Barriers to Discharge: GOC discussions, Hemodynamic stability, severe malnutrition, refeeding syndrome, empyema  Dispo: Anticipated discharge pending goals of care discussions and placement.  Andres Modeste N, DO 07/28/2020, 7:04 AM Pager: [phone] After 5pm on weekdays and 1pm on weekends: On Call pager [phone]
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
## 2020-07-29 DIAGNOSIS — J90 Pleural effusion, not elsewhere classified: Secondary | ICD-10-CM | POA: Diagnosis not present

## 2020-07-29 DIAGNOSIS — J168 Pneumonia due to other specified infectious organisms: Secondary | ICD-10-CM | POA: Diagnosis not present

## 2020-07-29 DIAGNOSIS — J869 Pyothorax without fistula: Secondary | ICD-10-CM | POA: Diagnosis not present

## 2020-07-29 DIAGNOSIS — B9620 Unspecified Escherichia coli [E. coli] as the cause of diseases classified elsewhere: Secondary | ICD-10-CM | POA: Diagnosis not present

## 2020-07-29 LAB — CBC WITH DIFFERENTIAL/PLATELET
Abs Immature Granulocytes: 0.65 10*3/uL — ABNORMAL HIGH (ref 0.00–0.07)
Basophils Absolute: 0.1 10*3/uL (ref 0.0–0.1)
Basophils Relative: 0 %
Eosinophils Absolute: 0.1 10*3/uL (ref 0.0–0.5)
Eosinophils Relative: 0 %
HCT: 27.5 % — ABNORMAL LOW (ref 39.0–52.0)
Hemoglobin: 8.9 g/dL — ABNORMAL LOW (ref 13.0–17.0)
Immature Granulocytes: 3 %
Lymphocytes Relative: 6 %
Lymphs Abs: 1.5 10*3/uL (ref 0.7–4.0)
MCH: 29.9 pg (ref 26.0–34.0)
MCHC: 32.4 g/dL (ref 30.0–36.0)
MCV: 92.3 fL (ref 80.0–100.0)
Monocytes Absolute: 2.1 10*3/uL — ABNORMAL HIGH (ref 0.1–1.0)
Monocytes Relative: 9 %
Neutro Abs: 20.5 10*3/uL — ABNORMAL HIGH (ref 1.7–7.7)
Neutrophils Relative %: 82 %
Platelets: 564 10*3/uL — ABNORMAL HIGH (ref 150–400)
RBC: 2.98 MIL/uL — ABNORMAL LOW (ref 4.22–5.81)
RDW: 13.7 % (ref 11.5–15.5)
WBC: 25 10*3/uL — ABNORMAL HIGH (ref 4.0–10.5)
nRBC: 0.1 % (ref 0.0–0.2)

## 2020-07-29 LAB — MAGNESIUM: Magnesium: 1.6 mg/dL — ABNORMAL LOW (ref 1.7–2.4)

## 2020-07-29 LAB — POTASSIUM: Potassium: 3.7 mmol/L (ref 3.5–5.1)

## 2020-07-29 LAB — PHOSPHORUS: Phosphorus: 2.2 mg/dL — ABNORMAL LOW (ref 2.5–4.6)

## 2020-07-29 MED ORDER — MAGNESIUM SULFATE 2 GM/50ML IV SOLN
2.0000 g | Freq: Once | INTRAVENOUS | Status: AC
  Administered 2020-07-29: 2 g via INTRAVENOUS
  Filled 2020-07-29: qty 50

## 2020-07-29 MED ORDER — ENOXAPARIN SODIUM 40 MG/0.4ML ~~LOC~~ SOLN
40.0000 mg | SUBCUTANEOUS | Status: DC
  Administered 2020-07-29 – 2020-07-30 (×2): 40 mg via SUBCUTANEOUS
  Filled 2020-07-29 (×2): qty 0.4

## 2020-07-29 MED ORDER — POTASSIUM PHOSPHATES 15 MMOLE/5ML IV SOLN
30.0000 mmol | Freq: Once | INTRAVENOUS | Status: AC
  Administered 2020-07-29: 30 mmol via INTRAVENOUS
  Filled 2020-07-29: qty 10

## 2020-07-29 NOTE — Progress Notes (Signed)
Occupational Therapy Treatment Patient Details Name: Andres [name] MRN: [id number] DOB: 1980/10/06 Today's Date: 07/29/2020    History of present illness 40 y.o. male presenting s/p unwhitnessed fall at home in the shower and changes from baseline including drooling. CXR (-), XR pelvis (-), and CT head/spine (-) for acute findings. EEG (-) for seizures. CT abdomen (+) SBO vs. illeus s/p  NG tube placement. Suspected fall 2/2 dehydration from diarrhea. Hosptial course complicated Pneumonia with pleural effusion, E coli bacteremia, upper GI bleed, and LUE flaccidity with concern for R MCA vs. TIA 2/2 complex medical Hx. vs. LUE/plexus/mechanical etiology. Imaging (-) for acute findings. Patient with continued LUE weakness. PMHx significant for fetal alcohol symdrome with cognitive impairment, Autism, non-verbal, and behaviors.   OT comments  Returned for a second visit to check RUE resting hand splint. Pt observed to be working on eating lunch with NT with pt needing total A for self feeding. Doffed splint with no redness or skin irriation noted. Performed light passive stretching to RUE as indicated below. Posted splint wearing schedule over pts bed and informed RN. Discussed need for LUE wrist cock up splint with ortho tech with ortho tech reporting they had already called [employer] to retrieve appropriate size. DC plan remains appropriate, will continue to follow acutely and update DC recs and splinting needs per POC.   Follow Up Recommendations  SNF    Equipment Recommendations  Hospital bed;Other (comment) (air bed/matress, hoyer lift)    Recommendations for Other Services      Precautions / Restrictions Precautions Precautions: Fall Precaution Comments: High Fall Risk; seizures, Required Braces or Orthoses: Other Brace Other Brace: R resting hand splint at night, L wrist cockup splint ( to be delievered) Restrictions Weight Bearing Restrictions: No       Mobility Bed Mobility                   Transfers                      Balance                                           ADL either performed or assessed with clinical judgement   ADL Overall ADL's : Needs assistance/impaired Eating/Feeding: Total assistance;Sitting Eating/Feeding Details (indicate cue type and reason): observed pt feeding with NT with pt needing total for self feeding from sitting up in bed Grooming: Wash/dry hands;Total assistance Grooming Details (indicate cue type and reason): hand hygiene to pts R palm prior to donning R splint                               General ADL Comments: session focus on implementing splint schedule. splint check completed on pts RUE for resting hand splint, no redness or irritation noted, hung sign in pts room and alerted RN, L wrist cock up splint to be delievered hopefully tomorrow?     Vision       Perception     Praxis      Cognition Arousal/Alertness: Awake/alert Behavior During Therapy: Flat affect Overall Cognitive Status: History of cognitive impairments - at baseline  General Comments: flat but nodding appropriately during session        Exercises General Exercises - Upper Extremity Elbow Flexion: PROM;Right;5 reps;Supine Elbow Extension: PROM;Right;5 reps;Supine Wrist Flexion: PROM;Right;5 reps;Supine Wrist Extension: PROM;Right;5 reps;Supine   Shoulder Instructions       General Comments placed sign over pts bed with appropriate wearing schedule, ortho tech report already called [employer] to get appropriate splint for LUE, will f/u as able to fit LUE splint    Pertinent Vitals/ Pain       Pain Assessment: Faces Faces Pain Scale: Hurts little more Pain Location: generalized discomfort when doffing splint to RUE and with ROM Pain Descriptors / Indicators: Discomfort;Grimacing Pain Intervention(s): Monitored during session;Repositioned  Home  Living                                          Prior Functioning/Environment              Frequency  Min 1X/week        Progress Toward Goals  OT Goals(current goals can now be found in the care plan section)  Progress towards OT goals: Progressing toward goals  Acute Rehab OT Goals Patient Stated Goal: Unable to state.  OT Goal Formulation: Patient unable to participate in goal setting Time For Goal Achievement: 08/07/20 Potential to Achieve Goals: Poor  Plan Discharge plan remains appropriate;Frequency remains appropriate    Co-evaluation                 AM-PAC OT "6 Clicks" Daily Activity     Outcome Measure   Help from another person eating meals?: Total Help from another person taking care of personal grooming?: Total Help from another person toileting, which includes using toliet, bedpan, or urinal?: Total Help from another person bathing (including washing, rinsing, drying)?: Total Help from another person to put on and taking off regular upper body clothing?: Total Help from another person to put on and taking off regular lower body clothing?: Total 6 Click Score: 6    End of Session Equipment Utilized During Treatment: Other (comment) (R resting hand splint)  OT Visit Diagnosis: Unsteadiness on feet (R26.81);Muscle weakness (generalized) (M62.81);Pain;Adult, failure to thrive (R62.7);Other (comment) Pain - part of body: Leg;Ankle and joints of foot   Activity Tolerance Patient tolerated treatment well   Patient Left in bed;with call bell/phone within reach;with nursing/sitter in room;Other (comment) (NT feeding pt)   Nurse Communication Mobility status        Time: 1259-1310 OT Time Calculation (min): 11 min  Charges: OT General Charges $OT Visit: 1 Visit OT Treatments $Orthotics Fit/Training: 8-22 mins $Orthotics/Prosthetics Check: 8-22 mins  Lenor Derrick., COTA/L Acute Rehabilitation  Services [phone]    [name] 07/29/2020, 3:03 PM

## 2020-07-29 NOTE — Progress Notes (Signed)
Daily Progress Note   Patient Name: Andres [name]       Date: 07/29/2020 DOB: May 16, 1981  Age: 40 y.o. MRN#: [id number] Attending Physician: Axel Filler, * Primary Care Physician: Lin Landsman, MD Admit Date: 07/01/2020  Reason for Consultation/Follow-up: Establishing goals of care  Patient continues to with persistent severe malnutrition. Continues on Augmentin however WBC rising with a temp spike of 100.4 overnight. Multiple contractures and pressure ulcers in various stages.   Patient's guardian Clyde Canterbury) was updated over the past 24 hours on patient's condition and poor prognosis. Detailed updates provided. I discussed at length recommendations as similarly provided by colleague to focus on Andres [name]' quality of life with recommendations for comfort, DNR/DNI. Clyde Canterbury verbalized understanding and also confirmed receipt of MOST/DNR forms to be reviewed and completed.   Guardian shares that she has forwarded all necessary information over to her leadership team for review and would not be able to make any final decisions until they further met and discussed. Amelia verbalized recommendations and hopes that final medical decisions would be made within the the next 24 hours.   I have attempted to reach back out to guardian however, unable to reach and additional Voicemails have been left. Patient continues to show signs of no improvement and is at high risk of further decompensation. He remains a full code/full scope care until further instructions by Guardian team.   Length of Stay: 27 days  Vital Signs: BP 101/70   Pulse (!) 122   Temp 99.5 F (37.5 C) (Axillary)   Resp 18   Ht '5\' 6"'  (1.676 m)   Wt 46.2 kg   SpO2 99%   BMI 16.44 kg/m  SpO2: SpO2: 99 % O2 Device: O2 Device: Room Air O2 Flow Rate: O2 Flow Rate (L/min): 2 L/min  The above conversation was completed via telephone due to the visitor restrictions during the COVID-19 pandemic. Thorough chart review and  discussion with necessary members of the care team was completed as part of assessment. All issues were discussed and addressed but no physical exam was performed.  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 Patient's hospital course has been complicated by upper GI bleed s/p EGD with severe grade D erosive esophagitis and angiodysplasias treated with APC, refeeding syndrome, right sided empyema, E. 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  Palliative care was asked to aid in goals of care conversations  Code Status:  Full code  Goals of Care/Recommendations:  Full Code/Full Scope  Amelia (Cyril) provided with extensive updates and recommendations. She confirms receipt of recommendations including MOST/DNR form. States information has been forwarded and shared with her leadership team and is pending review. Attempts to reach guardian for updates on decision making unsuccessful. Voicemails have been left.   PMT will continue to support and follow. I will be off-service tomorrow (Wed) returning on [place], however guardian has Palliative team contact information and email to provide decisions and updates.   Symptom Management:  Per attending   Prognosis: POOR   Discharge Planning: Hospice vs SNF   Thank you for allowing the Palliative Medicine Team to assist in the  care of this patient.  Time Total: 45 min.   Visit consisted of counseling and education dealing with the complex and emotionally intense issues of symptom management and  palliative care in the setting of serious and potentially life-threatening illness.Greater than 50%  of this time was spent counseling and coordinating care related to the above assessment and plan.  Alda Lea, AGPCNP-BC  Palliative Medicine Team [phone]

## 2020-07-29 NOTE — Consult Note (Addendum)
WOC Nurse wound follow up Patient receiving care in [facility]. Palliative Care Nurse Practitioner note from 07/28/20 at 12:49 indicates there is on-going discussion of code status and goals of care.  In light of this on-going discussion and participation by the guardian, I recommend continued use of enzymatic debridement and not pursing hydrotherapy at this time. WOC nurse will not follow at this time.  Please re-consult the WOC team if needed.  Helmut Muster, RN, MSN, CWOCN, CNS-BC, pager [phone]

## 2020-07-29 NOTE — Progress Notes (Signed)
Occupational Therapy Treatment Patient Details Name: Andres [name] MRN: [id number] DOB: [birth year] Today's Date: 07/29/2020    History of present illness 40 y.o. male presenting s/p unwhitnessed fall at home in the shower and changes from baseline including drooling. CXR (-), XR pelvis (-), and CT head/spine (-) for acute findings. EEG (-) for seizures. CT abdomen (+) SBO vs. illeus s/p  NG tube placement. Suspected fall 2/2 dehydration from diarrhea. Hosptial course complicated Pneumonia with pleural effusion, E coli bacteremia, upper GI bleed, and LUE flaccidity with concern for R MCA vs. TIA 2/2 complex medical Hx. vs. LUE/plexus/mechanical etiology. Imaging (-) for acute findings. Patient with continued LUE weakness. PMHx significant for fetal alcohol symdrome with cognitive impairment, Autism, non-verbal, and behaviors.   OT comments  Session with a focus on initiating splint schedule for R resting hand splint and L wrist cock- up splint. Pt asleep upon OTA arrival but aroused with increased time. Performed hand hygiene to pts R hand prior to donning R resting hand splint. Noted cock up splint delivered was for R hand vs. L, paged ortho tech to reorder appropriate splint. Pt positioned in sidelying on pts R side with RN stating NT had just positioned him on his side. Plan to return ~ 2 hours later for splint check. Pt would continue to benefit from skilled occupational therapy while admitted and after d/c to address the below listed limitations in order to improve overall functional mobility and facilitate independence with BADL participation. DC plan remains appropriate, will follow acutely per POC.    Follow Up Recommendations  SNF    Equipment Recommendations  Hospital bed;Other (comment) (air bed/matress, hoyer lift)    Recommendations for Other Services      Precautions / Restrictions Precautions Precautions: Fall Precaution Comments: High Fall Risk; seizures, R resting hand splint  at night, L wrist cockup splint Restrictions Weight Bearing Restrictions: No       Mobility Bed Mobility                  Transfers                      Balance                                           ADL either performed or assessed with clinical judgement   ADL Overall ADL's : Needs assistance/impaired     Grooming: Wash/dry hands;Total assistance Grooming Details (indicate cue type and reason): hand hygiene to pts R palm prior to donning R splint                               General ADL Comments: session focus on implementing splint schedule. hand hygiene on R hand before donning R resting hand splint     Vision       Perception     Praxis      Cognition Arousal/Alertness: Lethargic (moments of wakefulness but mostly asleep during session) Behavior During Therapy: Flat affect Overall Cognitive Status: History of cognitive impairments - at baseline                                          Exercises  Shoulder Instructions       General Comments noted increased dry skin and odor on pts R palm, performed hand hygiene with pt, RN present during task    Pertinent Vitals/ Pain       Pain Assessment: Faces Faces Pain Scale: Hurts a little bit Pain Location: generalized discomfort when donning splint to RUE Pain Descriptors / Indicators: Discomfort;Grimacing Pain Intervention(s): Monitored during session;Repositioned  Home Living                                          Prior Functioning/Environment              Frequency  Min 1X/week        Progress Toward Goals  OT Goals(current goals can now be found in the care plan section)  Progress towards OT goals: Progressing toward goals  Acute Rehab OT Goals Patient Stated Goal: Unable to state.  OT Goal Formulation: Patient unable to participate in goal setting Time For Goal Achievement: 08/07/20 Potential to  Achieve Goals: Poor  Plan Discharge plan remains appropriate;Frequency remains appropriate    Co-evaluation                 AM-PAC OT "6 Clicks" Daily Activity     Outcome Measure   Help from another person eating meals?: A Lot Help from another person taking care of personal grooming?: Total Help from another person toileting, which includes using toliet, bedpan, or urinal?: Total Help from another person bathing (including washing, rinsing, drying)?: Total Help from another person to put on and taking off regular upper body clothing?: Total Help from another person to put on and taking off regular lower body clothing?: Total 6 Click Score: 7    End of Session Equipment Utilized During Treatment: Other (comment) (R resting hand splint)  OT Visit Diagnosis: Unsteadiness on feet (R26.81);Muscle weakness (generalized) (M62.81);Pain;Adult, failure to thrive (R62.7);Other (comment) Pain - part of body: Leg;Ankle and joints of foot   Activity Tolerance Patient tolerated treatment well   Patient Left in bed;with call bell/phone within reach;with nursing/sitter in room   Nurse Communication Mobility status        Time: 3704-8889 OT Time Calculation (min): 11 min  Charges: OT General Charges $OT Visit: 1 Visit OT Treatments $Orthotics Fit/Training: 8-22 mins  Lenor Derrick., COTA/L Acute Rehabilitation Services [phone]    [name] 07/29/2020, 2:13 PM

## 2020-07-29 NOTE — Progress Notes (Addendum)
Subjective:  Andres [name] is a 40 y.o. with PMH of intellectual disability, fetal alcohol syndrome, autism, seizure disorder, GERD admit for fall due to dehydration/malnourishment on hospital day 27  Patient appears at his baseline this morning. Denies any acute pain or complaints. Mentions wanting to eat.   Objective:  Vital signs in last 24 hours: Vitals:   07/28/20 1954 07/28/20 2328 07/28/20 2329 07/29/20 0444  BP: 116/74 125/79  114/74  Pulse: (!) 102 (!) 126 (!) 110 (!) 109  Resp: 20 18  18   Temp: 99.1 F (37.3 C) 98.5 F (36.9 C)  98.1 F (36.7 C)  TempSrc: Axillary Oral  Axillary  SpO2: 100% 99%  100%  Weight:      Height:       Gen: Well-developed, chronically ill-appearing HEENT: NCAT head, hearing intact, EOMI Pulm: Breathing comfortably on room air, no cough, no distress  Extm: Stable lower extremity contractures, no peripheral edema Skin: Dry, Warm, multiple pressure ulcers covered with fresh bandaging Neuro: Able to follow directions  Assessment/Plan:  Principal Problem:   Empyema (HCC) Active Problems:   Pressure ulcer of sacrum   Ogilvie's syndrome   Protein-calorie malnutrition, severe   Refeeding syndrome   Pneumonia of right lung due to infectious organism   Bacteremia   Spastic quadriparesis (HCC)   Contractures involving both knees  Andres [name] is a 40 y.o. with PMH of intellectual disability, fetal alcohol syndrome, autism, seizure disorder, GERD admit for fall due to dehydration/malnourishment   Goals of care conversation Prolonged hospitalization with acute infection with concurrent debilitating chronic disease prompting goals of care conversation. Palliative care on board.  - Appreciate palliative care recs: We are recommending MOST form completion and code status change to DNR/DNI. Palliative care team has been in close communication with his appointed gaurdian.  Right-sided pneumonia with loculated pleural effusion E. Coli / MRSE  bacteremia Continuing Augmentin.  Prognosis remains poor due to difficulty in pursuing source control of the underlying infection.  Vitals table. Afebrile overnight. Wbc rising 15.2->25.  Palliative care and ID on board, appreciate recommendations - C/w Augementin 875mg  BID (end-date 08/27/19) - Trend fever curve - Trend cbc   Refeeding syndrome, Improving Severe malnutrition, Persistent  Chronic intestinal pseudo-obstruction Mag 1.6, Phos 2.2, K 3.7. Likely due to refeeding syndrome. Also has hx of chronic Ogilvie syndrome.  - Check electrolytes daily and replete as needed - Continue thiamine 100mg  daily  - RD following, appreciate recommendations: Boost, ensure, magic cup   Normocytic anemia due to chronic disease Hemoglobin 8.9 this am. Yesterday's hgb of 11.8 likely lab error as not consistent with prior hgbs. Status post 4 units PRBC during hospital course.  Likely due to upper GI bleed given severe esophagitis and AVM's that were coagulated this admission. Also contributing is current infection. Continue to hold aspirin. 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 Dantrolene with caution due to elevated LFT's; although will hold off for now given concerns of sedation and deteriorating clinical status  - Continue splinting at night    GERD w/ severe erosive esophagitis, chronic - Encourage good fluid intake with medications - Protonix 40mg  twice daily, long-term  - Head of bed > 30 degrees at all times  DVT prophx: lovenox Diet: Regular Code: Full  Prior to Admission Living Arrangement: Home Anticipated Discharge Location: Hospice vs SNF Barriers to Discharge: Medical tx Dispo: Anticipated discharge in  approximately 2-3 day(s).   Andres Barrio, MD 07/29/2020, 6:32 AM Pager: [phone] After 5pm on weekdays and 1pm on weekends: On Call Pager: [phone]

## 2020-07-30 ENCOUNTER — Inpatient Hospital Stay (HOSPITAL_COMMUNITY): Payer: Medicare Other

## 2020-07-30 DIAGNOSIS — D62 Acute posthemorrhagic anemia: Secondary | ICD-10-CM | POA: Diagnosis not present

## 2020-07-30 DIAGNOSIS — I639 Cerebral infarction, unspecified: Secondary | ICD-10-CM

## 2020-07-30 DIAGNOSIS — J189 Pneumonia, unspecified organism: Secondary | ICD-10-CM

## 2020-07-30 DIAGNOSIS — J869 Pyothorax without fistula: Secondary | ICD-10-CM | POA: Diagnosis not present

## 2020-07-30 DIAGNOSIS — J168 Pneumonia due to other specified infectious organisms: Secondary | ICD-10-CM | POA: Diagnosis not present

## 2020-07-30 DIAGNOSIS — R4182 Altered mental status, unspecified: Secondary | ICD-10-CM | POA: Diagnosis not present

## 2020-07-30 DIAGNOSIS — J90 Pleural effusion, not elsewhere classified: Secondary | ICD-10-CM | POA: Diagnosis not present

## 2020-07-30 DIAGNOSIS — B9620 Unspecified Escherichia coli [E. coli] as the cause of diseases classified elsewhere: Secondary | ICD-10-CM | POA: Diagnosis not present

## 2020-07-30 LAB — CBC
HCT: 24.4 % — ABNORMAL LOW (ref 39.0–52.0)
Hemoglobin: 8 g/dL — ABNORMAL LOW (ref 13.0–17.0)
MCH: 30.4 pg (ref 26.0–34.0)
MCHC: 32.8 g/dL (ref 30.0–36.0)
MCV: 92.8 fL (ref 80.0–100.0)
Platelets: 476 10*3/uL — ABNORMAL HIGH (ref 150–400)
RBC: 2.63 MIL/uL — ABNORMAL LOW (ref 4.22–5.81)
RDW: 13.9 % (ref 11.5–15.5)
WBC: 25 10*3/uL — ABNORMAL HIGH (ref 4.0–10.5)
nRBC: 0 % (ref 0.0–0.2)

## 2020-07-30 LAB — POTASSIUM: Potassium: 3.5 mmol/L (ref 3.5–5.1)

## 2020-07-30 LAB — MAGNESIUM: Magnesium: 1.8 mg/dL (ref 1.7–2.4)

## 2020-07-30 LAB — PHOSPHORUS: Phosphorus: 3.2 mg/dL (ref 2.5–4.6)

## 2020-07-30 LAB — MRSA PCR SCREENING: MRSA by PCR: NEGATIVE

## 2020-07-30 MED ORDER — POTASSIUM CHLORIDE CRYS ER 20 MEQ PO TBCR
40.0000 meq | EXTENDED_RELEASE_TABLET | Freq: Once | ORAL | Status: AC
  Administered 2020-07-30: 40 meq via ORAL
  Filled 2020-07-30: qty 2

## 2020-07-30 MED ORDER — VANCOMYCIN HCL 750 MG/150ML IV SOLN
750.0000 mg | Freq: Three times a day (TID) | INTRAVENOUS | Status: DC
  Administered 2020-07-30 – 2020-07-31 (×3): 750 mg via INTRAVENOUS
  Filled 2020-07-30 (×4): qty 150

## 2020-07-30 MED ORDER — SODIUM CHLORIDE 0.9 % IV SOLN
1.0000 g | Freq: Three times a day (TID) | INTRAVENOUS | Status: DC
  Administered 2020-07-30 – 2020-08-07 (×24): 1 g via INTRAVENOUS
  Filled 2020-07-30 (×26): qty 1

## 2020-07-30 MED ORDER — LACTATED RINGERS IV SOLN: INTRAVENOUS | Status: AC

## 2020-07-30 NOTE — Progress Notes (Signed)
Pharmacy Antibiotic Note  Andres [name] is a 40 y.o. male admitted on 07/01/2020 with empyema.  Pharmacy has been consulted for vanc/cefepime dosing.  Pt with complicated empyema that was drained on 12/23. He has now becoming febrile again with the suspicion of the same source. Since risk is to high for surgery, abx will be broaden out to vanc/cefepime. We will repeat MRSA PCR.   Scr 0.31 on 12/31 Wbc remains elevated Plan: Vanc 750mg  IV q12 (target AUC 400-550) Cefepime 1g IV q8 Level as needed MRSA PCR  Height: 5\' 6"  (167.6 cm) Weight: 46.2 kg (101 lb 13.6 oz) IBW/kg (Calculated) : 63.8  Temp (24hrs), Avg:99.5 F (37.5 C), Min:98.1 F (36.7 C), Max:102.2 F (39 C)  Recent Labs  Lab 07/24/20 0443 07/25/20 0424 07/25/20 1819 07/26/20 0700 07/27/20 0147 07/27/20 1954 07/28/20 0450 07/29/20 0530 07/30/20 0455  WBC 14.5* 19.5*   < > 20.0* 14.6* 19.2* 15.2* 25.0* 25.0*  CREATININE 0.40* 0.52*  --  0.38*  --   --   --   --   --    < > = values in this interval not displayed.    Estimated Creatinine Clearance: 80.2 mL/min (A) (by C-G formula based on SCr of 0.38 mg/dL (L)).    Allergies  Allergen Reactions  . Lactose Diarrhea    Antimicrobials this admission: 12/17 ceftriaxone >>12/20 12/20 Cefepime>> 12/27 Augmentin 12/27 >> 1/4 1/4 vanc>> 1/4 cefepime>>  Dose adjustments this admission:  Microbiology results: 12/23 Pleural fluid: ngtd 12/22 Bcx negF 12/20 MRSA PCR neg 12/18 ova + parasite sent 12/17 Bcx E coli 1/4 bottles - pan sens 12/9 GI panel neg 12/8 Ucx E coli - pan sens 12/7 Bcx ngtd 12/30 blood>>contaminant staph epi 1/4 blood>>  14/7, PharmD, [place], AAHIVP, CPP Infectious Disease Pharmacist 07/30/2020 1:29 PM

## 2020-07-30 NOTE — Progress Notes (Signed)
Occupational Therapy Treatment Patient Details Name: Andres [name] MRN: [id number] DOB: 06/23/1981 Today's Date: 07/30/2020    History of present illness 40 y.o. male presenting s/p unwhitnessed fall at home in the shower and changes from baseline including drooling. CXR (-), XR pelvis (-), and CT head/spine (-) for acute findings. EEG (-) for seizures. CT abdomen (+) SBO vs. illeus s/p  NG tube placement. Suspected fall 2/2 dehydration from diarrhea. Hosptial course complicated Pneumonia with pleural effusion, E coli bacteremia, upper GI bleed, and LUE flaccidity with concern for R MCA vs. TIA 2/2 complex medical Hx. vs. LUE/plexus/mechanical etiology. Imaging (-) for acute findings. Patient with continued LUE weakness. PMHx significant for fetal alcohol symdrome with cognitive impairment, Autism, non-verbal, and behaviors.   OT comments  Returned for second visit to assess fit of LUE wrist cock up splint after wearing ~ 2 hours. Doffed splint to note increased swelling and redness around L thumb. OTR enter to assess updating splint recs. Based on decreased level of AROM of BUEs and increased swelling and redness, updated splint orders to bilateral resting hand splints to be worn during the day ~ 8 hours- reached out to MD for new order. Will continue to follow acutely and assess splinting needs.   Follow Up Recommendations  SNF    Equipment Recommendations  Hospital bed;Other (comment) (air bed/matress, hoyer lift)    Recommendations for Other Services      Precautions / Restrictions Precautions Precautions: Fall Precaution Comments: High Fall Risk; seizures, Required Braces or Orthoses: Other Brace Other Brace: bilateral resting hand splints to be worn 8 hours during day with skin check every 3-4 hours Restrictions Weight Bearing Restrictions: No       Mobility Bed Mobility   Bed Mobility: Rolling Rolling: Total assist;+2 for physical assistance         General bed mobility  comments: total A +2 to roll R<>L for pericare  Transfers                 General transfer comment: Bed level activity only this date for assessment of splinting needs.    Balance                                           ADL either performed or assessed with clinical judgement   ADL Overall ADL's : Needs assistance/impaired         Upper Body Bathing: Total assistance;Bed level   Lower Body Bathing: Total assistance;Bed level   Upper Body Dressing : Total assistance;Bed level   Lower Body Dressing: Total assistance;Bed level               Functional mobility during ADLs: Total assistance;+2 for physical assistance;+2 for safety/equipment General ADL Comments: returned for second session to assess LUE splint fit, noted increased swelling and redness around left thumb, collaborated with OTR to update splint recs to bilateral resting hand splints as pt with worse AROM of bilateral hands. Additionally assissted NT with grooming tasks with pt needing total A +2 for all aspects of bed mobility and ADLs. pt lethargic most of session but did arouse with max mulitmodal cues and sternal rubbing     Vision       Perception     Praxis      Cognition Arousal/Alertness: Lethargic Behavior During Therapy: Flat affect Overall Cognitive Status: History of cognitive impairments - at  baseline                                 General Comments: pt more lethargic this session        Exercises     Shoulder Instructions       General Comments noted increased redness and swelling around pts L thumb after wearing splint ~ 2 hours. Noted wounds of bilateral feet beginning to develop, messaged MD about prevalon boots    Pertinent Vitals/ Pain       Pain Assessment: Faces Faces Pain Scale: No hurt  Home Living                                          Prior Functioning/Environment              Frequency  Min 1X/week         Progress Toward Goals  OT Goals(current goals can now be found in the care plan section)  Progress towards OT goals: Progressing toward goals  Acute Rehab OT Goals Patient Stated Goal: Unable to state.  OT Goal Formulation: Patient unable to participate in goal setting Time For Goal Achievement: 08/07/20 Potential to Achieve Goals: Poor  Plan Discharge plan remains appropriate;Frequency remains appropriate    Co-evaluation                 AM-PAC OT "6 Clicks" Daily Activity     Outcome Measure   Help from another person eating meals?: Total Help from another person taking care of personal grooming?: Total Help from another person toileting, which includes using toliet, bedpan, or urinal?: Total Help from another person bathing (including washing, rinsing, drying)?: Total Help from another person to put on and taking off regular upper body clothing?: Total Help from another person to put on and taking off regular lower body clothing?: Total 6 Click Score: 6    End of Session Equipment Utilized During Treatment: Other (comment)  OT Visit Diagnosis: Unsteadiness on feet (R26.81);Muscle weakness (generalized) (M62.81);Pain;Adult, failure to thrive (R62.7);Other (comment) Pain - part of body: Leg;Ankle and joints of foot   Activity Tolerance Patient limited by lethargy   Patient Left in bed;with call bell/phone within reach;with bed alarm set   Nurse Communication Mobility status        Time: 1950-9326 OT Time Calculation (min): 21 min  Charges: OT General Charges $OT Visit: 1 Visit OT Treatments $Orthotics Fit/Training: 8-22 mins $Orthotics/Prosthetics Check: 8-22 mins  Lenor Derrick., COTA/L Acute Rehabilitation Services [phone]    [name] 07/30/2020, 4:00 PM

## 2020-07-30 NOTE — Progress Notes (Addendum)
Subjective:  Andres [name] is a 40 y.o. with PMH of intellectual disability, fetal alcohol syndrome, autism, seizure disorder, GERD admit for fall due to dehydration/malnourishment on hospital day 43  Mr.Usery was examined and evaluated at bedside this am. He was noted to be resting comfortably in bed. At baseline this morning, does not endorse any acute complaint.  Objective:  Vital signs in last 24 hours: Vitals:   07/29/20 2043 07/29/20 2300 07/30/20 0100 07/30/20 0354  BP: 111/75 104/73 103/67 103/71  Pulse: (!) 116 (!) 119 (!) 111 (!) 116  Resp: 18 18 18 18   Temp: (!) 100.4 F (38 C) 99.8 F (37.7 C) 98.8 F (37.1 C) 98.1 F (36.7 C)  TempSrc: Axillary Axillary Axillary Oral  SpO2: 100%   100%  Weight:      Height:       Gen: Cachetic, chronically ill-appearing Pulm: Breathing comfortably on room air, no cough, no distress  Extm: Chronic lower extremity contractures, no peripheral edema Skin: Dry, Warm, multiple pressure ulcers in various stages of healing pictured under media tab   Assessment/Plan:  Principal Problem:   Empyema (HCC) Active Problems:   Pressure ulcer of sacrum   Ogilvie's syndrome   Protein-calorie malnutrition, severe   Refeeding syndrome   Pneumonia of right lung due to infectious organism   Bacteremia   Spastic quadriparesis (HCC)   Contractures involving both knees  Andres [name] is a 40 y.o. with PMH of intellectual disability, fetal alcohol syndrome, autism, seizure disorder, GERD admit for fall due to dehydration/malnourishment   Goals of care conversation Prolonged hospitalization with acute infection with concurrent debilitating chronic disease prompting goals of care conversation. Palliative care on board.  - Appreciate palliative care recs: Recommending MOST form completion and code status change to DNR/DNI. Palliative care team has been in close communication with his appointed gaurdian. - Attempted to contact with guardian  this am without success  Right-sided pneumonia with loculated pleural effusion Continuing Augmentin.  Prognosis remains poor due to difficulty in pursuing source control of the underlying infection. Continuing to have evidence of active infection. Vitals table. Temp peak of 100.32F overnight. Wbc rising 15.2->25->25. Previous culture with staph epidermidis in single bottle - Apppreciate Palliative care recs - C/w Augmentin 875mg  BID (end-date 08/27/19) - Trend fever curve - Trend cbc - Will get repeat blood cultures today  Refeeding syndrome, Improving Severe malnutrition, Persistent  Chronic intestinal pseudo-obstruction Mag 1.8, Phos 3.2, K 3.5. Likely due to refeeding syndrome. Also has hx of chronic Ogilvie syndrome.  - Check electrolytes daily and replete as needed - Continue thiamine 100mg  daily  - RD following, appreciate recommendations: Boost, ensure, magic cup   Normocytic anemia due to chronic disease 07/28/20 hgb of 11.8 likely lab error as not consistent with prior hgbs. Status post 4 units PRBC during hospital course.  Likely due to upper GI bleed given severe esophagitis and AVM's that were coagulated this admission. Also contributing is current infection. Continue to hold aspirin. 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 Complicated by difficulty in mobilizing himself. No obvious sign of infection on current ulcers but requires close monitoring - Continue splinting at night - Wound care consult    GERD w/ severe erosive esophagitis, chronic - Encourage good fluid intake with medications - Protonix 40mg  twice daily, long-term  -  Head of bed > 30 degrees at all times  DVT prophx: lovenox Diet: Regular Code: Full  Prior to Admission Living Arrangement:  Home Anticipated Discharge Location: Hospice vs SNF Barriers to Discharge: Goals of care Dispo: Anticipated discharge in approximately 2-3 day(s).   Andres Barrio, MD 07/30/2020, 7:11 AM Pager: [phone] After 5pm on weekdays and 1pm on weekends: On Call Pager: [phone]

## 2020-07-30 NOTE — Plan of Care (Signed)

## 2020-07-30 NOTE — Progress Notes (Signed)
Orthopedic Tech Progress Note Patient Details:  Andres [name] 1981/05/01 [id number] Ordered brace Patient ID: Andres [name], male   DOB: 06/13/1981, 40 y.o.   MRN: [id number]   Andres [name] 07/30/2020, 6:53 PM

## 2020-07-30 NOTE — Progress Notes (Signed)
Occupational Therapy Treatment Patient Details Name: Andres [name] MRN: [id number] DOB: July 02, 1981 Today's Date: 07/30/2020    History of present illness 40 y.o. male presenting s/p unwhitnessed fall at home in the shower and changes from baseline including drooling. CXR (-), XR pelvis (-), and CT head/spine (-) for acute findings. EEG (-) for seizures. CT abdomen (+) SBO vs. illeus s/p  NG tube placement. Suspected fall 2/2 dehydration from diarrhea. Hosptial course complicated Pneumonia with pleural effusion, E coli bacteremia, upper GI bleed, and LUE flaccidity with concern for R MCA vs. TIA 2/2 complex medical Hx. vs. LUE/plexus/mechanical etiology. Imaging (-) for acute findings. Patient with continued LUE weakness. PMHx significant for fetal alcohol symdrome with cognitive impairment, Autism, non-verbal, and behaviors.   OT comments  Pt seen for splint fitting session as L cock up splint was delievered. Pt much more lethargic this session very difficult to arouse. Donned splint to LUE with plan to return ~ 2hr for skin integrity assessment. Will continue to follow acutely per POC. DC plan remains appropriate.   Follow Up Recommendations  SNF    Equipment Recommendations  Hospital bed;Other (comment) (air bed/matress, hoyer lift)    Recommendations for Other Services      Precautions / Restrictions Precautions Precaution Comments: High Fall Risk; seizures, Required Braces or Orthoses: Other Brace Other Brace: R resting hand splint at night, Restrictions Weight Bearing Restrictions: No       Mobility Bed Mobility                  Transfers                      Balance                                           ADL either performed or assessed with clinical judgement   ADL Overall ADL's : Needs assistance/impaired                                       General ADL Comments: session focus on implementing splint schedule  for LUE as L wrist cock up splint was delievered, pt continues to be lethargic during sessions however applied splint to pts LUE as splint trial to return in 2 hours for assess for skin break down     Vision       Perception     Praxis      Cognition Arousal/Alertness: Lethargic   Overall Cognitive Status: History of cognitive impairments - at baseline                                 General Comments: pt more lethargic this session        Exercises     Shoulder Instructions       General Comments      Pertinent Vitals/ Pain       Pain Assessment: Faces Faces Pain Scale: No hurt  Home Living                                          Prior Functioning/Environment  Frequency  Min 1X/week        Progress Toward Goals  OT Goals(current goals can now be found in the care plan section)  Progress towards OT goals: Progressing toward goals  Acute Rehab OT Goals Patient Stated Goal: Unable to state.  OT Goal Formulation: Patient unable to participate in goal setting Potential to Achieve Goals: Poor  Plan Discharge plan remains appropriate;Frequency remains appropriate    Co-evaluation                 AM-PAC OT "6 Clicks" Daily Activity     Outcome Measure   Help from another person eating meals?: Total Help from another person taking care of personal grooming?: Total Help from another person toileting, which includes using toliet, bedpan, or urinal?: Total Help from another person bathing (including washing, rinsing, drying)?: Total Help from another person to put on and taking off regular upper body clothing?: Total   6 Click Score: 5    End of Session Equipment Utilized During Treatment: Other (comment) (L cock up wrist splint)  OT Visit Diagnosis: Unsteadiness on feet (R26.81);Muscle weakness (generalized) (M62.81);Pain;Adult, failure to thrive (R62.7);Other (comment) Pain - part of body:  Leg;Ankle and joints of foot   Activity Tolerance Patient limited by lethargy   Patient Left in bed;with call bell/phone within reach;with bed alarm set   Nurse Communication Mobility status        Time: 0277-4128 OT Time Calculation (min): 9 min  Charges: OT General Charges $OT Visit: 1 Visit OT Treatments $Orthotics Fit/Training: 8-22 mins  Lenor Derrick., COTA/L Acute Rehabilitation Services [phone]    [name] 07/30/2020, 3:46 PM

## 2020-07-31 DIAGNOSIS — J168 Pneumonia due to other specified infectious organisms: Secondary | ICD-10-CM | POA: Diagnosis not present

## 2020-07-31 DIAGNOSIS — J869 Pyothorax without fistula: Secondary | ICD-10-CM | POA: Diagnosis not present

## 2020-07-31 DIAGNOSIS — J90 Pleural effusion, not elsewhere classified: Secondary | ICD-10-CM | POA: Diagnosis not present

## 2020-07-31 DIAGNOSIS — B9620 Unspecified Escherichia coli [E. coli] as the cause of diseases classified elsewhere: Secondary | ICD-10-CM | POA: Diagnosis not present

## 2020-07-31 LAB — COMPREHENSIVE METABOLIC PANEL
ALT: 47 U/L — ABNORMAL HIGH (ref 0–44)
AST: 49 U/L — ABNORMAL HIGH (ref 15–41)
Albumin: <1 g/dL — ABNORMAL LOW (ref 3.5–5.0)
Alkaline Phosphatase: 81 U/L (ref 38–126)
Anion gap: 9 (ref 5–15)
BUN: 11 mg/dL (ref 6–20)
CO2: 23 mmol/L (ref 22–32)
Calcium: 7.2 mg/dL — ABNORMAL LOW (ref 8.9–10.3)
Chloride: 102 mmol/L (ref 98–111)
Creatinine, Ser: <0.3 mg/dL — ABNORMAL LOW (ref 0.61–1.24)
Glucose, Bld: 80 mg/dL (ref 70–99)
Potassium: 3.7 mmol/L (ref 3.5–5.1)
Sodium: 134 mmol/L — ABNORMAL LOW (ref 135–145)
Total Bilirubin: 0.5 mg/dL (ref 0.3–1.2)
Total Protein: 3.9 g/dL — ABNORMAL LOW (ref 6.5–8.1)

## 2020-07-31 LAB — CBC
HCT: 19.8 % — ABNORMAL LOW (ref 39.0–52.0)
Hemoglobin: 6.3 g/dL — CL (ref 13.0–17.0)
MCH: 30.1 pg (ref 26.0–34.0)
MCHC: 31.8 g/dL (ref 30.0–36.0)
MCV: 94.7 fL (ref 80.0–100.0)
Platelets: 383 10*3/uL (ref 150–400)
RBC: 2.09 MIL/uL — ABNORMAL LOW (ref 4.22–5.81)
RDW: 13.9 % (ref 11.5–15.5)
WBC: 21.4 10*3/uL — ABNORMAL HIGH (ref 4.0–10.5)
nRBC: 0 % (ref 0.0–0.2)

## 2020-07-31 LAB — PHOSPHORUS: Phosphorus: 2.5 mg/dL (ref 2.5–4.6)

## 2020-07-31 LAB — HEMOGLOBIN AND HEMATOCRIT, BLOOD
HCT: 26.7 % — ABNORMAL LOW (ref 39.0–52.0)
Hemoglobin: 8.7 g/dL — ABNORMAL LOW (ref 13.0–17.0)

## 2020-07-31 LAB — GLUCOSE, CAPILLARY: Glucose-Capillary: 90 mg/dL (ref 70–99)

## 2020-07-31 LAB — MAGNESIUM: Magnesium: 1.4 mg/dL — ABNORMAL LOW (ref 1.7–2.4)

## 2020-07-31 LAB — PREPARE RBC (CROSSMATCH)

## 2020-07-31 MED ORDER — ENOXAPARIN SODIUM 30 MG/0.3ML ~~LOC~~ SOLN
30.0000 mg | SUBCUTANEOUS | Status: DC
  Administered 2020-07-31 – 2020-08-06 (×7): 30 mg via SUBCUTANEOUS
  Filled 2020-07-31 (×6): qty 0.3

## 2020-07-31 MED ORDER — K PHOS MONO-SOD PHOS DI & MONO 155-852-130 MG PO TABS
250.0000 mg | ORAL_TABLET | Freq: Two times a day (BID) | ORAL | Status: AC
  Administered 2020-07-31 (×2): 250 mg via ORAL
  Filled 2020-07-31 (×2): qty 1

## 2020-07-31 MED ORDER — VANCOMYCIN HCL 750 MG/150ML IV SOLN: 750.0000 mg | Freq: Two times a day (BID) | INTRAVENOUS | Status: DC

## 2020-07-31 MED ORDER — SODIUM CHLORIDE 0.9% IV SOLUTION: Freq: Once | INTRAVENOUS | Status: AC

## 2020-07-31 MED ORDER — LACTATED RINGERS IV SOLN: INTRAVENOUS | Status: AC

## 2020-07-31 MED ORDER — MAGNESIUM SULFATE 4 GM/100ML IV SOLN
4.0000 g | Freq: Once | INTRAVENOUS | Status: AC
  Administered 2020-07-31: 4 g via INTRAVENOUS
  Filled 2020-07-31: qty 100

## 2020-07-31 NOTE — Progress Notes (Addendum)
Subjective:  Andres [name] is a 40 y.o. with PMH of intellectual disability, fetal alcohol [name], autism, seizure disorder, GERD admit for fall due to dehydration/malnourishment on hospital day 29  Patient evaluated at bedside this morning. Responds verbally, seems to be tired but otherwise near his baseline.  Had prolonged conversation with Andres [name]. Patient's guardian regarding worsening prognosis with recurrent fevers, anemia, hypotension and electrolyte deficiencies. Andres [name] expressed understanding and requests additional documentation to further support DNR/DNI / MOST form completion  Objective:  Vital signs in last 24 hours: Vitals:   07/30/20 2136 07/30/20 2321 07/31/20 0349 07/31/20 0751  BP: 100/65 102/71 102/69 (!) (P) 99/59  Pulse: (!) 108 (!) 111 (!) 106 (P) 90  Resp: (!) 22 (!) 24 (!) 26 (P) 20  Temp: 98 F (36.7 C) 100.2 F (37.9 C) 98.6 F (37 C) (P) 98 F (36.7 C)  TempSrc: Oral Oral Oral (P) Oral  SpO2: 99% 100% 99% (P) 98%  Weight:      Height:       Gen: Chronically ill-appearing, not in acute distress HEENT: NCAT head, hearing intact, MMM Pulm: Breathing comfortably on room air, no cough, no distress Abd: Soft, NTND, No rebound, no guarding Extm:Stable chronic lower extremity contractures, upper extremities in orthopedic splints, prevalon boot on Skin: Dry, Warm, multiple pressure wounds covered with bandaging  Assessment/Plan:  Principal Problem:   Empyema (HCC) Active Problems:   Pressure ulcer of sacrum   Andres [name]   Protein-calorie malnutrition, severe   Refeeding [name]   Pneumonia of right lung due to infectious organism   Bacteremia   Spastic quadriparesis (HCC)   Contractures involving both knees  Andres [name] is a 40 y.o. with PMH of intellectual disability, fetal alcohol [name], autism, seizure disorder, GERD admit for fall due to dehydration/malnourishment   GOALS OF CARE Andres [name] have been hospitalized  for a month with multiple comorbidities without significant improvement. He has had prolonged antibiotic course but continues to be febrile with tenuous vitals and worsening opacity on imaging as source control for his empyema cannot be achieved. He has had persistent malnutrition with electrolyte abnormalities despite appropriate oral intake. He continues to be anemic, requiring multiple transfusions despite no obvious evidence of frank bleed. His prognosis is extremely poor and at this point he is lacking any meaningful alternatives for curative treatment. He is an appropriate candidate for transition to hospice care. He would be an appropriate candidate for DNR/DNI code status as well considering aggressive resuscitative efforts in the event of arrest would not likely to be successful in setting of his multiple comorbidities and poor prognosis.  Right-sided pneumonia with loculated pleural effusion Continuing Augmentin.  Prognosis remains poor due to difficulty in pursuing source control of the underlying infection. Worsening febrile episodes with temp peak of 102.68F. Soft bp to 90s sytolic yesterday. Wbc 25->25->21 Previous culture with staph epidermidis in single bottle. Blood culture collected yesterday NGTD. Abx broadened yesterday to vanc/cefepime - Apppreciate Palliative care recs - F/u blood cultures - C/w vanc, cefepime - Trend fever curve - Trend cbc  Refeeding [name], Improving Severe malnutrition, Persistent  Chronic intestinal pseudo-obstruction Mag 1.4, Phos 2.5, K 3.7. Persistent electrolyte abnormalities concerning despite oral intake, likely due to hx of chronic Ogilvie [name].  - Check electrolytes daily and replete as needed - Continue thiamine 100mg  daily  - RD following, appreciate recommendations: Boost, ensure, magic cup   Normocytic anemia due to chronic disease vs bleed Prior hx of upper GI bleed w/ severe  esophagitis and AVM's that have resolved. Continue to hold  aspirin. Hgb trend 8.9->8.0->6.3 this am. 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 Complicated by difficulty in mobilizing himself. No obvious sign of infection on current ulcers but requires close monitoring - Continue splinting at night - Wound care consult    GERD w/ severe erosive esophagitis, chronic - Encourage good fluid intake with medications - Protonix 40mg  twice daily, long-term  - Head of bed > 30 degrees at all times  DVT prophx: lovenox Diet: Regular Code: Full  Prior to Admission Living Arrangement: Home Anticipated Discharge Location: Hospice vs SNF Barriers to Discharge: Goals of care Dispo: Anticipated discharge in approximately 2-3 day(s).   , MD 07/31/2020, 9:53 AM Pager: [phone] After 5pm on weekdays and 1pm on weekends: On Call Pager: [phone]

## 2020-07-31 NOTE — Progress Notes (Signed)
Occupational Therapy Treatment Patient Details Name: Andres [name] MRN: [id number] DOB: 12/04/1980 Today's Date: 07/31/2020    History of present illness 40 y.o. male presenting s/p unwhitnessed fall at home in the shower and changes from baseline including drooling. CXR (-), XR pelvis (-), and CT head/spine (-) for acute findings. EEG (-) for seizures. CT abdomen (+) SBO vs. illeus s/p  NG tube placement. Suspected fall 2/2 dehydration from diarrhea. Hosptial course complicated Pneumonia with pleural effusion, E coli bacteremia, upper GI bleed, and LUE flaccidity with concern for R MCA vs. TIA 2/2 complex medical Hx. vs. LUE/plexus/mechanical etiology. Imaging (-) for acute findings. Patient with continued LUE weakness. PMHx significant for fetal alcohol symdrome with cognitive impairment, Autism, non-verbal, and behaviors.   OT comments  Pt seen for continued assessment of splint care. Pt asleep upon arrival but easily able to arouse this session. Donned bilateral resting hand splints and noted prevalon boots donned, additionally placed pillow between pts knees pt prevent further skin breakdown. Pt stated "pee" during session noting that pt had episode of urine incontinence, total A +2 to roll pt R<>L for pericare and to don new brief. Plan to return ~ 2 hours to assess skin integrity from bilateral resting hand splints.   Follow Up Recommendations  SNF    Equipment Recommendations  Hospital bed;Other (comment) (air bed/matress, hoyer lift)    Recommendations for Other Services      Precautions / Restrictions Precautions Precautions: Fall Precaution Comments: High Fall Risk; seizures, bilateral leg contractures Required Braces or Orthoses: Other Brace Other Brace: bilateral resting hand splints to be worn 8 hours during day with skin check every 3-4 hours Restrictions Weight Bearing Restrictions: No       Mobility Bed Mobility Overal bed mobility: Needs Assistance Bed Mobility:  Rolling Rolling: Total assist;+2 for physical assistance         General bed mobility comments: total A +2 to roll R<>L for pericare and to change brief  Transfers                      Balance                                           ADL either performed or assessed with clinical judgement   ADL Overall ADL's : Needs assistance/impaired             Lower Body Bathing: Total assistance;Bed level Lower Body Bathing Details (indicate cue type and reason): total A for posterior pericare     Lower Body Dressing: Total assistance;Bed level Lower Body Dressing Details (indicate cue type and reason): to don new brief via rolling from bed level     Toileting- Clothing Manipulation and Hygiene: Total assistance;Bed level Toileting - Clothing Manipulation Details (indicate cue type and reason): Total A for hygiene/clothing management at bed level.     Functional mobility during ADLs: Total assistance;+2 for physical assistance;+2 for safety/equipment General ADL Comments: donned bilateral resting hand splints and performed hand hygiene prior to donning, pt stated "pee" noted that pts brief was wet, total A +2 to roll pt for pericare and LB dressing change     Vision       Perception     Praxis      Cognition Arousal/Alertness: Lethargic;Awake/alert (initally lethargic but arouses more as session progresses) Behavior During Therapy: Flat affect Overall  Cognitive Status: History of cognitive impairments - at baseline                                          Exercises     Shoulder Instructions       General Comments RN enter to change sacral dressing as dressing noted to have stool on it, bilateral resting hand splints donned, will return in ~ 2 hours to assess skin integrity. Completed hand hygiene to bilateral hands prior to donning splints     Pertinent Vitals/ Pain       Pain Assessment: Faces Faces Pain Scale: No  hurt  Home Living                                          Prior Functioning/Environment              Frequency  Min 1X/week        Progress Toward Goals  OT Goals(current goals can now be found in the care plan section)  Progress towards OT goals: Progressing toward goals  Acute Rehab OT Goals Patient Stated Goal: Unable to state.  OT Goal Formulation: Patient unable to participate in goal setting Time For Goal Achievement: 08/07/20 Potential to Achieve Goals: Poor  Plan Discharge plan remains appropriate;Frequency remains appropriate    Co-evaluation                 AM-PAC OT "6 Clicks" Daily Activity     Outcome Measure   Help from another person eating meals?: Total Help from another person taking care of personal grooming?: Total Help from another person toileting, which includes using toliet, bedpan, or urinal?: Total Help from another person bathing (including washing, rinsing, drying)?: Total Help from another person to put on and taking off regular upper body clothing?: Total Help from another person to put on and taking off regular lower body clothing?: Total 6 Click Score: 6    End of Session    OT Visit Diagnosis: Unsteadiness on feet (R26.81);Muscle weakness (generalized) (M62.81);Pain;Adult, failure to thrive (R62.7);Other (comment) Pain - part of body: Leg;Ankle and joints of foot   Activity Tolerance Patient tolerated treatment well   Patient Left in bed;with call bell/phone within reach;with bed alarm set   Nurse Communication Mobility status        Time: 6010-9323 OT Time Calculation (min): 40 min  Charges: OT General Charges $OT Visit: 1 Visit OT Treatments $Self Care/Home Management : 23-37 mins $Orthotics Fit/Training: 8-22 mins  Lenor Derrick., COTA/L Acute Rehabilitation Services [phone]    [name] 07/31/2020, 12:19 PM

## 2020-07-31 NOTE — Progress Notes (Signed)
Occupational Therapy Treatment Patient Details Name: Andres [name] MRN: [id number] DOB: 04-20-1981 Today's Date: 07/31/2020    History of present illness 40 y.o. male presenting s/p unwhitnessed fall at home in the shower and changes from baseline including drooling. CXR (-), XR pelvis (-), and CT head/spine (-) for acute findings. EEG (-) for seizures. CT abdomen (+) SBO vs. illeus s/p  NG tube placement. Suspected fall 2/2 dehydration from diarrhea. Hosptial course complicated Pneumonia with pleural effusion, E coli bacteremia, upper GI bleed, and LUE flaccidity with concern for R MCA vs. TIA 2/2 complex medical Hx. vs. LUE/plexus/mechanical etiology. Imaging (-) for acute findings. Patient with continued LUE weakness. PMHx significant for fetal alcohol symdrome with cognitive impairment, Autism, non-verbal, and behaviors.   OT comments  Pt seen for second session to assess skin integrity after wearing bilateral resting hand splints ~ 2 hours. Noted some increased swelling on dorsal aspect of pts L hand, performed retrograde massage,adjusted strap placement, and elevated pts LUE on pillow to assist with edema mgmt. Education provided to RN on splint wearing schedule and continued skin checks from wearing splints with written scheduled placed in pts room. Additionally recommend to RN to reposition pt every 2 hours to decrease risk for further skin break down. Will continue to follow acutely per POC and assess pt for splintng and positioning needs. This OTA continues to be concerned as pt was walking PTA and now demonstrates fetal positioning and with no UE movement.    Follow Up Recommendations  SNF    Equipment Recommendations  Hospital bed;Other (comment) (air bed/matress, hoyer lift)    Recommendations for Other Services      Precautions / Restrictions Precautions Precautions: Fall Precaution Comments: High Fall Risk; seizures, bilateral leg contractures Required Braces or Orthoses:  Other Brace Other Brace: bilateral resting hand splints to be worn 8 hours during day with skin check every 3-4 hours Restrictions Weight Bearing Restrictions: No       Mobility Bed Mobility Overal bed mobility: Needs Assistance Bed Mobility: Rolling Rolling: Total assist;+2 for physical assistance         General bed mobility comments: total A +2 to roll R<>L for pericare and to change brief  Transfers                      Balance                                           ADL either performed or assessed with clinical judgement   ADL Overall ADL's : Needs assistance/impaired             Lower Body Bathing: Total assistance;Bed level Lower Body Bathing Details (indicate cue type and reason): total A for posterior pericare     Lower Body Dressing: Total assistance;Bed level Lower Body Dressing Details (indicate cue type and reason): to don new brief via rolling from bed level     Toileting- Clothing Manipulation and Hygiene: Total assistance;Bed level Toileting - Clothing Manipulation Details (indicate cue type and reason): Total A for hygiene/clothing management at bed level.     Functional mobility during ADLs: Total assistance;+2 for physical assistance;+2 for safety/equipment General ADL Comments: second session with a focus on assessing skin integrity after wearing bilateral resting hand splints ~ 2 hours.Noted some swelling on the L dorsal aspect of pts hand, performed retrograde massge, respositioned  straps and positioned LUE on pillow, posted wearing schedule in pts room and provided education to RN on splint schedule and continued skin checks.     Vision       Perception     Praxis      Cognition Arousal/Alertness: Awake/alert Behavior During Therapy: Flat affect Overall Cognitive Status: History of cognitive impairments - at baseline                                 General Comments: stating "yes" "no" and  "eat" during session        Exercises General Exercises - Upper Extremity Wrist Flexion: PROM;Right;5 reps;Supine;Both Wrist Extension: PROM;5 reps;Supine;Both Digit Composite Flexion: PROM;Both;5 reps Composite Extension: PROM;Both;5 reps Hand Exercises Forearm Supination: PROM;Both;Supine;5 reps Forearm Pronation: PROM;Both;Supine;5 reps   Shoulder Instructions       General Comments noted some increased swelling on dorsal aspect of pts L hand, adjusted straps, performed light retrograde massage and positioned LUE on pillow to assist with edema mgmt    Pertinent Vitals/ Pain       Pain Assessment: No/denies pain (pt able to state "no" when asked about pain from light ROM in BUEs) Faces Pain Scale: No hurt  Home Living                                          Prior Functioning/Environment              Frequency  Min 1X/week        Progress Toward Goals  OT Goals(current goals can now be found in the care plan section)  Progress towards OT goals: Goals drowngraded-see care plan  Acute Rehab OT Goals Patient Stated Goal: Unable to state.  OT Goal Formulation: Patient unable to participate in goal setting Time For Goal Achievement: 08/07/20 Potential to Achieve Goals: Poor  Plan Other (comment) (DC pt from OT)    Co-evaluation                 AM-PAC OT "6 Clicks" Daily Activity     Outcome Measure   Help from another person eating meals?: Total Help from another person taking care of personal grooming?: Total Help from another person toileting, which includes using toliet, bedpan, or urinal?: Total Help from another person bathing (including washing, rinsing, drying)?: Total Help from another person to put on and taking off regular upper body clothing?: Total Help from another person to put on and taking off regular lower body clothing?: Total 6 Click Score: 6    End of Session    OT Visit Diagnosis: Unsteadiness on feet  (R26.81);Muscle weakness (generalized) (M62.81);Pain;Adult, failure to thrive (R62.7);Other (comment) Pain - part of body: Leg;Ankle and joints of foot   Activity Tolerance Patient tolerated treatment well   Patient Left in bed;with call bell/phone within reach;with bed alarm set   Nurse Communication Mobility status        Time: 6387-5643 OT Time Calculation (min): 13 min  Charges: OT General Charges $OT Visit: 1 Visit OT Treatments $Self Care/Home Management : 23-37 mins $Orthotics Fit/Training: 8-22 mins $Orthotics/Prosthetics Check: 8-22 mins Lenor Derrick., COTA/L Acute Rehabilitation Services [phone]    [name] 07/31/2020, 12:33 PM

## 2020-07-31 NOTE — Progress Notes (Signed)
Pt is currently on Lovenox 40mg  for DVT prophylaxis. He weighs 46kg (BMI 16) ok to reduce lovenox to 30mg  SQ qday. MRSA PCR neg again. D/w Dr. , ok to dc vanc.   , PharmD, BCIDP, AAHIVP, CPP Infectious Disease Pharmacist 07/31/2020 10:13 AM

## 2020-07-31 NOTE — Progress Notes (Signed)
Nutrition Follow-up  DOCUMENTATION CODES:   Severe malnutrition in context of chronic illness  INTERVENTION:   Continue snacks TID  ContinueEnsure Enlive poTID, each supplement provides 350 kcal and 20 grams of protein  ContinueMagic cup TID with meals, each supplement provides 290 kcal and 9 grams of protein  ContinueMVI with minerals daily   NUTRITION DIAGNOSIS:   Severe Malnutrition related to chronic illness (fetal alcohol syndrome and autism) as evidenced by severe muscle depletion,severe fat depletion.  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 Pt found to have upper GI bleed 2/2 angiodysplasia (resolved) and acute normocytic anemia (resolved). 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 Per MD, hiatal hernia and Grade D esophageal reflux may be contributing to chronic vomiting. Follow up gastrografin follow through study showed normal passage through the colon and rectum. Pt was treated conservatively with NGT decompression and N/V have since resolved.  Pt mostly nonverbal at baseline.  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 Biopsied.  12/13- diet advanced to soft; GI signed off 12/14- diet advanced to regular 12/23- R thoracentesis due to pleural effusion  Per MD, pt has had worsening prognosis with recurrent fevers, anemia, hypotension,  and electrolyte deficiencies. Medical team continues to recommend comfort measures to pt's caregiver who requested to have additional documentation to further support DNR/DNI/MOST form completion. Per caregiver, this is still pending review by her leadership. PMT attempted to reach caregiver today for updates with no success.    Pt continues to have good po intake, 80-100% completion x last 8 recorded meals (94% average intake). Pt has been receiving Boost Breeze TID, snacks TID, and Magic Cup TID. Pt consuming supplements well per RN.   No UOP documented.  Admit wt: 46.8 kg Current wt: 46.2 kg  Labs: Na 134 (L), Mg 1.4 (L) Medications: lactaid, mvi with minerals, K Phos neutral, thiamine  Diet Order:   Diet Order            Diet regular Room service appropriate? Yes; Fluid consistency: Thin  Diet effective now                 EDUCATION NEEDS:   Not appropriate for education at this time  Skin:  Skin Assessment: Skin Integrity Issues: Skin Integrity Issues:: Stage I,Stage II,Other (Comment) Stage I: rt proximal hip Stage II: sacrum, rt proximal hip, left lateral back Other: puncture back  Last BM:  1/5 type 6  Height:   Ht Readings from Last 1 Encounters:  07/07/20 5\' 6"  (1.676 m)    Weight:   Wt Readings from Last 1 Encounters:  07/25/20 46.2 kg    Ideal Body Weight:  61.8 kg  BMI:  Body mass index is 16.44 kg/m.  Estimated Nutritional Needs:   Kcal:  1850-2050  Protein:  90-105 grams  Fluid:  > 1.8 L    07/27/20, MS, RD, LDN RD pager number and weekend/on-call pager number located in  Amion.

## 2020-08-01 DIAGNOSIS — I639 Cerebral infarction, unspecified: Secondary | ICD-10-CM | POA: Diagnosis not present

## 2020-08-01 DIAGNOSIS — B9620 Unspecified Escherichia coli [E. coli] as the cause of diseases classified elsewhere: Secondary | ICD-10-CM | POA: Diagnosis not present

## 2020-08-01 DIAGNOSIS — R4182 Altered mental status, unspecified: Secondary | ICD-10-CM | POA: Diagnosis not present

## 2020-08-01 DIAGNOSIS — J90 Pleural effusion, not elsewhere classified: Secondary | ICD-10-CM | POA: Diagnosis not present

## 2020-08-01 DIAGNOSIS — J168 Pneumonia due to other specified infectious organisms: Secondary | ICD-10-CM | POA: Diagnosis not present

## 2020-08-01 DIAGNOSIS — J869 Pyothorax without fistula: Secondary | ICD-10-CM | POA: Diagnosis not present

## 2020-08-01 DIAGNOSIS — M24561 Contracture, right knee: Secondary | ICD-10-CM | POA: Diagnosis not present

## 2020-08-01 LAB — COMPREHENSIVE METABOLIC PANEL
ALT: 55 U/L — ABNORMAL HIGH (ref 0–44)
AST: 58 U/L — ABNORMAL HIGH (ref 15–41)
Albumin: 1 g/dL — ABNORMAL LOW (ref 3.5–5.0)
Alkaline Phosphatase: 105 U/L (ref 38–126)
Anion gap: 7 (ref 5–15)
BUN: 8 mg/dL (ref 6–20)
CO2: 26 mmol/L (ref 22–32)
Calcium: 7.2 mg/dL — ABNORMAL LOW (ref 8.9–10.3)
Chloride: 100 mmol/L (ref 98–111)
Creatinine, Ser: 0.32 mg/dL — ABNORMAL LOW (ref 0.61–1.24)
GFR, Estimated: >60 mL/min (ref >60)
Glucose, Bld: 89 mg/dL (ref 70–99)
Potassium: 3.4 mmol/L — ABNORMAL LOW (ref 3.5–5.1)
Sodium: 133 mmol/L — ABNORMAL LOW (ref 135–145)
Total Bilirubin: 0.6 mg/dL (ref 0.3–1.2)
Total Protein: 4.4 g/dL — ABNORMAL LOW (ref 6.5–8.1)

## 2020-08-01 LAB — TYPE AND SCREEN
ABO/RH(D): A NEG
Antibody Screen: NEGATIVE
Unit division: 0

## 2020-08-01 LAB — BPAM RBC
Blood Product Expiration Date: 202201192359
ISSUE DATE / TIME: 202201051128
Unit Type and Rh: 600

## 2020-08-01 LAB — CBC
HCT: 26.4 % — ABNORMAL LOW (ref 39.0–52.0)
Hemoglobin: 8.6 g/dL — ABNORMAL LOW (ref 13.0–17.0)
MCH: 29.4 pg (ref 26.0–34.0)
MCHC: 32.6 g/dL (ref 30.0–36.0)
MCV: 90.1 fL (ref 80.0–100.0)
Platelets: 396 10*3/uL (ref 150–400)
RBC: 2.93 MIL/uL — ABNORMAL LOW (ref 4.22–5.81)
RDW: 14.9 % (ref 11.5–15.5)
WBC: 24.4 10*3/uL — ABNORMAL HIGH (ref 4.0–10.5)
nRBC: 0 % (ref 0.0–0.2)

## 2020-08-01 LAB — GLUCOSE, CAPILLARY: Glucose-Capillary: 86 mg/dL (ref 70–99)

## 2020-08-01 LAB — PHOSPHORUS: Phosphorus: 2.7 mg/dL (ref 2.5–4.6)

## 2020-08-01 LAB — MAGNESIUM: Magnesium: 1.8 mg/dL (ref 1.7–2.4)

## 2020-08-01 NOTE — Progress Notes (Addendum)
Subjective:   Patient sleeping comfortably upon exam this morning.   Objective:  Vital signs in last 24 hours: Vitals:   07/31/20 1427 07/31/20 1547 07/31/20 2128 08/01/20 0010  BP: 106/77 114/73 (!) 116/57 114/63  Pulse: (!) 102 (!) 108 (!) 102 99  Resp: 19 18 (!) 26 (!) 22  Temp: 99 F (37.2 C) 98.7 F (37.1 C) 99.9 F (37.7 C) 98.8 F (37.1 C)  TempSrc: Axillary Oral Oral Oral  SpO2: 100% 99% 98% 100%  Weight:      Height:       Gen: Chronically ill-appearing, not in acute distress Pulm: Breathing comfortably on room air, no cough, no distress Abd: Soft, NTND, No rebound, no guarding Extm: Stable chronic lower extremity contractures, upper extremities in orthopedic splints, prevalon boot on Skin: Dry, Warm, multiple pressure wounds covered with bandaging  Assessment/Plan:  Principal Problem:   Empyema (HCC) Active Problems:   Pressure ulcer of sacrum   Ogilvie's syndrome   Protein-calorie malnutrition, severe   Refeeding syndrome   Pneumonia of right lung due to infectious organism   Bacteremia   Spastic quadriparesis (HCC)   Contractures involving both knees  Erian Rosengren is a 40 y.o. with PMH of intellectual disability, fetal alcohol syndrome, autism, seizure disorder, GERD admit for fall due to dehydration/malnourishment   GOALS OF CARE Mr.Mcgregor have been hospitalized for a month with multiple comorbidities and complications. His prognosis is extremely poor and at this point he is lacking any meaningful alternatives for curative treatment. He is an appropriate candidate for transition to hospice care. He would be an appropriate candidate for DNR/DNI code status considering aggressive resuscitative efforts in the event of arrest would not likely to be successful nor led to meaningful recovery. Palliative care has been in contact with patient's guardian today and we are awaiting their decision in regards to treatment course, comfort versus aggressive.   #  Community Acquired Pneumonia  # Complicated Para-pneumonic Effusion  Mr. Packman has remained afebrile in the past 24 hours, however continues to have profound leukocytosis despite prolonged antibiotic course (today is Day 20). At this time, plan to continue with antibiotic treatment and follow repeat cultures (so far, NGTD). MRSA pcr negative so Vancomycin discontinued yesterday. Of note, patient is high risk for C. Diff, so will need to be vigilant for changes in bowel movements.   - Continue Cefepime - Follow blood cultures  - Trend fever curve - Trend WBC  # Refeeding syndrome, Improving # Severe malnutrition, Persistent  # Chronic intestinal pseudo-obstruction Electrolytes today have improved compared to yesterday.   - Check electrolytes daily and replete as needed - Continue thiamine 100mg  daily  - RD following, appreciate recommendations: Boost, ensure, magic cup   # Normocytic Anemia  Recent history of upper GI bleed w/ severe esophagitis and AVM's that were resolved earlier in this hospitalization. No melena to suggest re-bleed. Iron panel obtained earlier this hospitalization with evidence of anemia of chronic disease. It is possible patient is having bone marrow suppression in light of acute illness, with now difficulty recovering after GI bleed. Hemoglobin this morning is stable since 1 unit transfusion yesterday.  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   Dr. Internal Medicine PGY-2  Pager: [phone] After 5pm on weekdays and 1pm on weekends: On Call pager [phone]  08/01/2020, 6:34 AM    Internal Medicine Attending:   I saw and examined the patient. I reviewed the resident's note and I agree with the resident's findings and plan as documented in the resident's note.  Hospital day #30 for this 40 year old person living with cognitive impairment being treated with IV cefepime for a right-sided complicated pneumonia with parapneumonic effusion/empyema.  Definitive therapy with surgical drainage is not possible due to his malnutrition and terrible functional status.  Clinical status remains stable today, overall prognosis is poor.  No changes in medical management today are necessary.  His functional status continues to decline, despite 30 days of aggressive inpatient management we have made no significant improvement on his malnutrition and he is showing worsening signs of pressure ulcers and limb contractures.  Greatly appreciate palliative care assistance and the work of his guardianship team who are trying to make him as comfortable as possible.  40, MD

## 2020-08-01 NOTE — Consult Note (Signed)
WOC Nurse wound follow up Patient receiving care in [facility] [id number]. Wound type: unstageable PI to sacrum, photo of area posted 07/30/20. On admission "cracking" was documented to sacrum. On 12/15 the area was documented as an "abrasion.  On 1/4 I implemented santyl and saline moistened gauze to the area. Measurement: 4 cm x 4.5 cm x unknown depth Wound bed: 100% tan, dry, slough Drainage (amount, consistency, odor) none Periwound: intact Dressing procedure/placement/frequency:  Continue enzymatic debridement for this wound. Monitor the wound area(s) for worsening of condition such as: Signs/symptoms of infection,  Increase in size,  Development of or worsening of odor, Development of pain, or increased pain at the affected locations.  Notify the medical team if any of these develop.  Thank you for the consult.  Discussed plan of care with the bedside nurse.  WOC nurse will not follow at this time.  Please re-consult the WOC team if needed.  Helmut Muster, RN, MSN, CWOCN, CNS-BC, pager [phone] .

## 2020-08-01 NOTE — Progress Notes (Addendum)
Attempted to reach out to patient's guardian Andres [name] with [employer]). Unable to reach, appropriate voicemail left.   Chart reviewed and updates received from RN. Patient assessed and is unable to engage appropriately in discussions.  Andres [name] continues to show minimal signs of meaningful recovery and recommendations remain to focus on his comfort and symptom management during what time he does have left. This has been expressed by the Palliative to patient's guardian in addition to the the attending team.   I am hopeful guardian will return call and continue further discussions with a result of some final decisions focusing on the best interest and qualify of life of Andres [name]. I have emphasized to guardian I would be more than open to also further discuss goals of care/recommendations directly with her leadership and or all that is involved in reviewing sent documentation and final decisions.   10:30 am: Received call back from [employer] (Guardian). Extensive updates provided in addition to confirmed updates received by attending yesterday.   I discussed at length the importance of decision making focusing on Andres [name] quality of life. Education provided on current plan of care, all provided efforts and despite aggressive treatments patient's continued decline and worsening condition. Recommendations provided and encouraged to consider comfort care measures with awareness patient is not showing any meaningful improvement and prognosis remains poor. Education provided on current full code status and concerns that Andres [name] is very high risk for sudden decompensation, cardiac/pulmonary arrest leading to death.   Extensive discussion regarding what a full code would look like for patient with consideration to his current illness. Guardian verbalized understanding and the risk of causing more traumatic events for patient with a poor chance of survival if he was to encounter an emergent event.    Education also provided regarding what comfort care measures would look like for Andres [name] while hospitalized in addition to outpatient at a hospice facility if a hospitalized death was not anticipated. Mrs. Patel verbalized understanding.   She reports all information will be reviewed with her leadership. She declines to arrange further meetings with her team involving myself or other providers. She shares they have expressed they are reviewing the sent documentation and will hopefully have some insight and or decisions later today. She is planning to provide more verbal updates post our phone discussions. She is also requesting a copy of his current medications be sent to be included in their evaluation as requested by their team. Information request is being sent.   All questions answered.   Recommendations: -Continue to treat the treatable, pending Guardian team's decisions on how to proceed with care comfort vs. Aggressive.  -Medication list to be sent as requested.  -Per Andres [name], the team is reviewing all sent documentations and will hopefully have an answer today regarding comfort vs continued care.  -detailed updates/Education provided to Guardian.  -Prognosis remains poor. Recommendations remain for comfort/DNR in the setting on no meaningful recovery despite efforts, decreased quality of life.   -PMT will continue to support and follow.   Time Total: 45 min.   Visit consisted of counseling and education dealing with the complex and emotionally intense issues of symptom management and palliative care in the setting of serious and potentially life-threatening illness.Greater than 50%  of this time was spent counseling and coordinating care related to the above assessment and plan.  The above conversation was completed via telephone due to the visitor restrictions during the COVID-19 pandemic. Thorough chart review and discussion with  necessary members of the care team was completed as  part of assessment. All issues were discussed and addressed but no physical exam was performed.  Willette Alma, AGPCNP-BC Palliative Medicine Team  Phone: [phone] Pager: [phone] Amion: Thea Alken

## 2020-08-02 DIAGNOSIS — J168 Pneumonia due to other specified infectious organisms: Secondary | ICD-10-CM | POA: Diagnosis not present

## 2020-08-02 DIAGNOSIS — B9620 Unspecified Escherichia coli [E. coli] as the cause of diseases classified elsewhere: Secondary | ICD-10-CM | POA: Diagnosis not present

## 2020-08-02 DIAGNOSIS — J869 Pyothorax without fistula: Secondary | ICD-10-CM | POA: Diagnosis not present

## 2020-08-02 DIAGNOSIS — J90 Pleural effusion, not elsewhere classified: Secondary | ICD-10-CM | POA: Diagnosis not present

## 2020-08-02 LAB — COMPREHENSIVE METABOLIC PANEL
ALT: 47 U/L — ABNORMAL HIGH (ref 0–44)
AST: 46 U/L — ABNORMAL HIGH (ref 15–41)
Albumin: 1 g/dL — ABNORMAL LOW (ref 3.5–5.0)
Alkaline Phosphatase: 98 U/L (ref 38–126)
Anion gap: 6 (ref 5–15)
BUN: 8 mg/dL (ref 6–20)
CO2: 28 mmol/L (ref 22–32)
Calcium: 7.6 mg/dL — ABNORMAL LOW (ref 8.9–10.3)
Chloride: 102 mmol/L (ref 98–111)
Creatinine, Ser: 0.4 mg/dL — ABNORMAL LOW (ref 0.61–1.24)
GFR, Estimated: >60 mL/min (ref >60)
Glucose, Bld: 122 mg/dL — ABNORMAL HIGH (ref 70–99)
Potassium: 3.5 mmol/L (ref 3.5–5.1)
Sodium: 136 mmol/L (ref 135–145)
Total Bilirubin: 0.4 mg/dL (ref 0.3–1.2)
Total Protein: 5.1 g/dL — ABNORMAL LOW (ref 6.5–8.1)

## 2020-08-02 LAB — RETICULOCYTES
Immature Retic Fract: 17.2 % — ABNORMAL HIGH (ref 2.3–15.9)
RBC.: 2.98 MIL/uL — ABNORMAL LOW (ref 4.22–5.81)
Retic Count, Absolute: 45 10*3/uL (ref 19.0–186.0)
Retic Ct Pct: 1.5 % (ref 0.4–3.1)

## 2020-08-02 LAB — PHOSPHORUS: Phosphorus: 2.2 mg/dL — ABNORMAL LOW (ref 2.5–4.6)

## 2020-08-02 LAB — CBC
HCT: 26.3 % — ABNORMAL LOW (ref 39.0–52.0)
Hemoglobin: 8.9 g/dL — ABNORMAL LOW (ref 13.0–17.0)
MCH: 30.5 pg (ref 26.0–34.0)
MCHC: 33.8 g/dL (ref 30.0–36.0)
MCV: 90.1 fL (ref 80.0–100.0)
Platelets: 389 10*3/uL (ref 150–400)
RBC: 2.92 MIL/uL — ABNORMAL LOW (ref 4.22–5.81)
RDW: 14.6 % (ref 11.5–15.5)
WBC: 20.4 10*3/uL — ABNORMAL HIGH (ref 4.0–10.5)
nRBC: 0 % (ref 0.0–0.2)

## 2020-08-02 LAB — MAGNESIUM: Magnesium: 1.7 mg/dL (ref 1.7–2.4)

## 2020-08-02 LAB — GLUCOSE, CAPILLARY: Glucose-Capillary: 120 mg/dL — ABNORMAL HIGH (ref 70–99)

## 2020-08-02 MED ORDER — POTASSIUM PHOSPHATES 15 MMOLE/5ML IV SOLN
20.0000 mmol | Freq: Once | INTRAVENOUS | Status: AC
  Administered 2020-08-02: 20 mmol via INTRAVENOUS
  Filled 2020-08-02: qty 6.67

## 2020-08-02 MED ORDER — MAGNESIUM SULFATE 2 GM/50ML IV SOLN
2.0000 g | Freq: Once | INTRAVENOUS | Status: AC
  Administered 2020-08-02: 2 g via INTRAVENOUS
  Filled 2020-08-02: qty 50

## 2020-08-02 NOTE — Progress Notes (Signed)
Pharmacy Antibiotic Note  Colum Colt is a 40 y.o. male admitted on 07/01/2020 with empyema.  Pharmacy has been consulted for cefepime dosing.  Pt with complicated empyema that was drained on 12/23. He became febrile again with the suspicion of the same source and vancomycin and cefepime started; vancomycin has been d/c'd. He is not a surgical candidate. Renal function has remained stable and do not expect a change in dose. GOC discussion in progress.  Plan: Continue cefepime 1 g IV q8h Pharmacy signing off - if renal function changes, decision support software will alert pharmacy to adjust dose   Height: 5\' 6"  (167.6 cm) Weight: 46.2 kg (101 lb 13.6 oz) IBW/kg (Calculated) : 63.8  Temp (24hrs), Avg:98.8 F (37.1 C), Min:98.1 F (36.7 C), Max:99.7 F (37.6 C)  Recent Labs  Lab 07/29/20 0530 07/30/20 0455 07/31/20 0627 08/01/20 0237 08/01/20 0819 08/02/20 0212  WBC 25.0* 25.0* 21.4*  --  24.4* 20.4*  CREATININE  --   --  <0.30* 0.32*  --  0.40*    Estimated Creatinine Clearance: 80.2 mL/min (A) (by C-G formula based on SCr of 0.4 mg/dL (L)).    Allergies  Allergen Reactions  . Lactose Diarrhea    Antimicrobials this admission: 12/17 ceftriaxone >>12/20 12/20 Cefepime>> 12/27 Augmentin 12/27 >> 1/4 1/4 vanc>>1/5 1/4 cefepime>>  Dose adjustments this admission:  Microbiology results: 12/23 Pleural fluid: ngtd 12/22 Bcx negF 12/20 MRSA PCR neg 12/18 ova + parasite sent 12/17 Bcx E coli 1/4 bottles - pan sens 12/9 GI panel neg 12/8 Ucx E coli - pan sens 12/7 Bcx ngtd 12/30 blood>>likely contaminant staph epi per ID 1/4 blood>>ngtd 1/4 MRSA PCR>>neg  Thank you for involving pharmacy in this patient's care.  1/31, PharmD, BCPS Clinical Pharmacist Clinical phone for 08/02/2020 until 3p is [phone] 08/02/2020 1:11 PM  **Pharmacist phone directory can be found on [email or web address] listed under [facility]**

## 2020-08-02 NOTE — Progress Notes (Signed)
Attempted to reach out to [place] ([employer]) for follow-up on yesterday's discussion and documentation review with hopes for decisions by she and her leadership team. Unable to reach. Voicemail left.   Willette Alma, AGPCNP-BC Palliative Medicine Team  Phone: [phone]  No Charge

## 2020-08-02 NOTE — Progress Notes (Signed)
Subjective:   [name] was evaluated on bedside rounds this AM. When asked how he feels, states that he feels bad and says that he is hungry. He denies pain though. Mental status unchanged from baseline.  Objective:  Vital signs in last 24 hours: Vitals:   08/01/20 1535 08/01/20 2015 08/02/20 0011 08/02/20 0300  BP: 126/66 (!) 102/57 (!) 103/57 (!) 153/83  Pulse: (!) 104 (!) 110 (!) 108 (!) 110  Resp: 20 18 (!) 24 20  Temp: 99 F (37.2 C) 99.7 F (37.6 C) 98.3 F (36.8 C) 98.5 F (36.9 C)  TempSrc: Oral Axillary Axillary Axillary  SpO2: 100% 99% 97% 98%  Weight:      Height:       Gen: Chronically ill-appearing, not in acute distress Cardiac: Borderline tachycardia with regular rate. No murmurs.  Pulm: Breathing comfortably on room air. Crackles heard bilaterally throughout lower and upper lung fields. No wheezing.  Abd: Soft, NTND, No rebound, no guarding Extm: Stable chronic lower extremity contractures, upper extremities in orthopedic splints, prevalon boot on Skin: Dry, Warm, multiple pressure wounds covered with bandaging  Assessment/Plan:  Principal Problem:   Empyema (HCC) Active Problems:   Pressure ulcer of sacrum   Ogilvie's syndrome   Protein-calorie malnutrition, severe   Refeeding syndrome   Pneumonia of right lung due to infectious organism   Bacteremia   Spastic quadriparesis (HCC)   Contractures involving both knees  Andres [name] is a 40 y.o. with PMH of intellectual disability, fetal alcohol syndrome, autism, seizure disorder, GERD admit for fall due to dehydration/malnourishment   GOALS OF CARE Mr.Andres [name] have been hospitalized for a month with multiple comorbidities and complications. His prognosis is extremely poor and at this point he is lacking any meaningful alternatives for curative treatment. He is an appropriate candidate for transition to hospice care. He would be an appropriate candidate for DNR/DNI code status considering  aggressive resuscitative efforts in the event of arrest would not likely to be successful nor led to meaningful recovery. Palliative care has been in contact with patient's guardian and we are still awaiting their decision in regards to treatment course, comfort versus aggressive.   # Community Acquired Pneumonia  # Complicated Para-pneumonic Effusion  Day 21 of antibiotics, Day 4 of Cefepime. At this time, plan to continue with antibiotic treatment and follow repeat cultures (so far, NGTD @ 2 days). Patient has remained afebrile for 48 hours. Of note, patient is high risk for C. Diff, so will need to be vigilant for changes in bowel movements.   - Continue Cefepime - Follow blood cultures  - Trend fever curve - Trend WBC  # Refeeding syndrome, Improving # Severe malnutrition, Persistent  # Chronic intestinal pseudo-obstruction Magnesium, phosphorus and potassium all borderline low today.   - Check electrolytes daily and replete as needed - Continue thiamine 100mg  daily  - RD following, appreciate recommendations: Boost, ensure, magic cup   # Normocytic Anemia  Recent history of upper GI bleed w/ severe esophagitis and AVM's that were resolved earlier in this hospitalization. No melena to suggest re-bleed. Iron panel obtained earlier this hospitalization with evidence of anemia of chronic disease. It is possible patient is having bone marrow suppression in light of acute illness, with now difficulty recovering after GI bleed. Hemoglobin has remained stable at this time.  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   Dr. Internal Medicine PGY-2  Pager: [phone] After 5pm on weekdays and 1pm on weekends: On Call pager [phone]  08/02/2020, 6:49 AM

## 2020-08-03 DIAGNOSIS — E43 Unspecified severe protein-calorie malnutrition: Secondary | ICD-10-CM | POA: Diagnosis not present

## 2020-08-03 DIAGNOSIS — J869 Pyothorax without fistula: Secondary | ICD-10-CM | POA: Diagnosis not present

## 2020-08-03 DIAGNOSIS — R4182 Altered mental status, unspecified: Secondary | ICD-10-CM | POA: Diagnosis not present

## 2020-08-03 DIAGNOSIS — J90 Pleural effusion, not elsewhere classified: Secondary | ICD-10-CM | POA: Diagnosis not present

## 2020-08-03 LAB — COMPREHENSIVE METABOLIC PANEL
ALT: 46 U/L — ABNORMAL HIGH (ref 0–44)
AST: 47 U/L — ABNORMAL HIGH (ref 15–41)
Albumin: 1 g/dL — ABNORMAL LOW (ref 3.5–5.0)
Alkaline Phosphatase: 115 U/L (ref 38–126)
Anion gap: 9 (ref 5–15)
BUN: 9 mg/dL (ref 6–20)
CO2: 28 mmol/L (ref 22–32)
Calcium: 7.6 mg/dL — ABNORMAL LOW (ref 8.9–10.3)
Chloride: 98 mmol/L (ref 98–111)
Creatinine, Ser: 0.36 mg/dL — ABNORMAL LOW (ref 0.61–1.24)
GFR, Estimated: >60 mL/min (ref >60)
Glucose, Bld: 97 mg/dL (ref 70–99)
Potassium: 3.8 mmol/L (ref 3.5–5.1)
Sodium: 135 mmol/L (ref 135–145)
Total Bilirubin: 0.4 mg/dL (ref 0.3–1.2)
Total Protein: 5.4 g/dL — ABNORMAL LOW (ref 6.5–8.1)

## 2020-08-03 LAB — CBC
HCT: 28.6 % — ABNORMAL LOW (ref 39.0–52.0)
Hemoglobin: 9.3 g/dL — ABNORMAL LOW (ref 13.0–17.0)
MCH: 29.8 pg (ref 26.0–34.0)
MCHC: 32.5 g/dL (ref 30.0–36.0)
MCV: 91.7 fL (ref 80.0–100.0)
Platelets: 440 10*3/uL — ABNORMAL HIGH (ref 150–400)
RBC: 3.12 MIL/uL — ABNORMAL LOW (ref 4.22–5.81)
RDW: 14.5 % (ref 11.5–15.5)
WBC: 30.3 10*3/uL — ABNORMAL HIGH (ref 4.0–10.5)
nRBC: 0 % (ref 0.0–0.2)

## 2020-08-03 LAB — PHOSPHORUS: Phosphorus: 3.4 mg/dL (ref 2.5–4.6)

## 2020-08-03 LAB — MAGNESIUM: Magnesium: 1.7 mg/dL (ref 1.7–2.4)

## 2020-08-03 LAB — GLUCOSE, CAPILLARY
Glucose-Capillary: 95 mg/dL (ref 70–99)
Glucose-Capillary: 80 mg/dL (ref 70–99)

## 2020-08-03 MED ORDER — MAGNESIUM SULFATE 2 GM/50ML IV SOLN
2.0000 g | Freq: Once | INTRAVENOUS | Status: AC
  Administered 2020-08-03: 2 g via INTRAVENOUS
  Filled 2020-08-03: qty 50

## 2020-08-03 NOTE — Progress Notes (Signed)
Subjective:   Patient sleeping comfortably on examination. He wakes some but falls back asleep quickly. He does not appear in any distress.   Objective:  Vital signs in last 24 hours: Vitals:   08/02/20 1519 08/02/20 2000 08/03/20 0121 08/03/20 0420  BP: 113/67 106/62 103/64 104/68  Pulse: (!) 110 (!) 108 (!) 106 (!) 107  Resp: 20 20 20 20   Temp: 99.1 F (37.3 C) 99.7 F (37.6 C) (!) 97.5 F (36.4 C) 98 F (36.7 C)  TempSrc: Oral Oral Axillary Axillary  SpO2: 98% 100% 100% 100%  Weight:      Height:       Gen: Chronically ill-appearing, not in acute distress Cardiac: 2+ right radial pulse. Left radial pulse is 1+ and difficult to palpate. Cool left hand to touch.  Abd: Soft, NTND, No rebound, no guarding Extm: Stable chronic lower extremity contractures, upper extremities in orthopedic splints, prevalon boot on Skin: Dry, multiple pressure wounds covered with bandaging  Assessment/Plan:  Principal Problem:   Empyema (HCC) Active Problems:   Pressure ulcer of sacrum   Ogilvie's syndrome   Protein-calorie malnutrition, severe   Refeeding syndrome   Pneumonia of right lung due to infectious organism   Bacteremia   Spastic quadriparesis (HCC)   Contractures involving both knees  Andres [name] is a 40 y.o. with PMH of intellectual disability, fetal alcohol syndrome, autism, seizure disorder, GERD admit for fall due to dehydration/malnourishment   GOALS OF CARE Andres [name] have been hospitalized for a month with multiple comorbidities and complications. His prognosis is extremely poor and at this point he is lacking any meaningful alternatives for curative treatment. Andres [name] has ongoing pneumonia with complicated para-pneumonic effusion without ability to obtain source control Ultimately, this may led to rapid deterioration. He is an appropriate candidate for transition to hospice care. He would be an appropriate candidate for DNR/DNI code status considering  aggressive resuscitative efforts in the event of arrest would not likely to be successful nor led to meaningful recovery. Palliative care has been in contact with patient's guardian and we are still awaiting their decision in regards to treatment course, comfort versus aggressive.   # Community Acquired Pneumonia  # Complicated Para-pneumonic Effusion  Day 22 of antibiotics, Day 5 of Cefepime. Leukocytosis continues to worsen; today is profoundly elevated at 30. Unlikely to obtain any control of infection without source control. Patient is not a candidate for intervention for source control though. At this time, plan to continue with antibiotic treatment. Of note, patient is high risk for C. Diff, so will need to be vigilant for changes in bowel movements.   - Continue Cefepime - Follow blood cultures  - Trend fever curve - Trend WBC  # Refeeding syndrome, Improving # Severe malnutrition, Persistent  # Chronic intestinal pseudo-obstruction Magnesium low today; potassium and phosphate normalized.   - Check electrolytes daily and replete as needed - Continue thiamine 100mg  daily  - RD following, appreciate recommendations: Boost, ensure, magic cup   # Normocytic Anemia  Recent history of upper GI bleed w/ severe esophagitis and AVM's that were resolved earlier in this hospitalization. No melena to suggest re-bleed. Iron panel obtained earlier this hospitalization with evidence of anemia of chronic disease. It is possible patient is having bone marrow suppression in light of acute illness, with now difficulty recovering after GI bleed. Hemoglobin has remained stable at this time.  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 Remove splints during the daytime - Concerning exam for lack of perfusion to left  hand; monitor closely, especially with splints on.  - Appreciate wound care recommendations    # GERD w/ severe erosive esophagitis, chronic - Encourage good fluid intake with medications - Protonix 40mg  twice daily, long-term  - Head of bed > 30 degrees at all times  DVT prophx: lovenox Diet: Regular Code: Full  Prior to Admission Living Arrangement: Home Anticipated Discharge Location: Hospice vs SNF Barriers to Discharge: Goals of care Dispo: Anticipated discharge undetermined.   Dr. Internal Medicine PGY-2  Pager: [phone] After 5pm on weekdays and 1pm on weekends: On Call pager [phone]  08/03/2020, 7:01 AM

## 2020-08-04 LAB — CULTURE, BLOOD (ROUTINE X 2)
Culture: NO GROWTH
Culture: NO GROWTH
Special Requests: ADEQUATE
Special Requests: ADEQUATE

## 2020-08-04 LAB — COMPREHENSIVE METABOLIC PANEL
ALT: 42 U/L (ref 0–44)
AST: 44 U/L — ABNORMAL HIGH (ref 15–41)
Albumin: <1 g/dL — ABNORMAL LOW (ref 3.5–5.0)
Alkaline Phosphatase: 89 U/L (ref 38–126)
Anion gap: 5 (ref 5–15)
BUN: 10 mg/dL (ref 6–20)
CO2: 28 mmol/L (ref 22–32)
Calcium: 7.2 mg/dL — ABNORMAL LOW (ref 8.9–10.3)
Chloride: 103 mmol/L (ref 98–111)
Creatinine, Ser: 0.39 mg/dL — ABNORMAL LOW (ref 0.61–1.24)
GFR, Estimated: >60 mL/min (ref >60)
Glucose, Bld: 104 mg/dL — ABNORMAL HIGH (ref 70–99)
Potassium: 3.2 mmol/L — ABNORMAL LOW (ref 3.5–5.1)
Sodium: 136 mmol/L (ref 135–145)
Total Bilirubin: 0.5 mg/dL (ref 0.3–1.2)
Total Protein: 5.2 g/dL — ABNORMAL LOW (ref 6.5–8.1)

## 2020-08-04 LAB — PHOSPHORUS: Phosphorus: 2.5 mg/dL (ref 2.5–4.6)

## 2020-08-04 LAB — GLUCOSE, CAPILLARY: Glucose-Capillary: 91 mg/dL (ref 70–99)

## 2020-08-04 LAB — CBC
HCT: 24 % — ABNORMAL LOW (ref 39.0–52.0)
Hemoglobin: 8.1 g/dL — ABNORMAL LOW (ref 13.0–17.0)
MCH: 30.5 pg (ref 26.0–34.0)
MCHC: 33.8 g/dL (ref 30.0–36.0)
MCV: 90.2 fL (ref 80.0–100.0)
Platelets: 394 10*3/uL (ref 150–400)
RBC: 2.66 MIL/uL — ABNORMAL LOW (ref 4.22–5.81)
RDW: 14.4 % (ref 11.5–15.5)
WBC: 23.7 10*3/uL — ABNORMAL HIGH (ref 4.0–10.5)
nRBC: 0 % (ref 0.0–0.2)

## 2020-08-04 LAB — MAGNESIUM: Magnesium: 2 mg/dL (ref 1.7–2.4)

## 2020-08-04 MED ORDER — POTASSIUM CHLORIDE 10 MEQ/100ML IV SOLN
10.0000 meq | INTRAVENOUS | Status: AC
  Administered 2020-08-04 (×2): 10 meq via INTRAVENOUS
  Filled 2020-08-04 (×2): qty 100

## 2020-08-04 NOTE — Progress Notes (Signed)
Subjective:   Patient sleeping comfortably on examination.  Does not appear in any acute distress.  Objective:  Vital signs in last 24 hours: Vitals:   08/04/20 0034 08/04/20 0458 08/04/20 0945 08/04/20 1223  BP: 99/65 94/63 104/66 104/70  Pulse: 98 (!) 103 (!) 106 (!) 105  Resp: 20 20 (!) 22 16  Temp:  99.3 F (37.4 C) 97.7 F (36.5 C) 98.7 F (37.1 C)  TempSrc:  Oral Axillary Oral  SpO2: 100% 98% 99% 100%  Weight:      Height:       Gen: Chronically ill-appearing, not in acute distress Cardiac: Regular rate and rhythm, no murmurs.  Abd: Soft, NTND, No rebound, no guarding Extm: Stable chronic lower extremity contractures, upper extremities in orthopedic splints, prevalon boot on Skin: Dry, multiple pressure wounds covered with bandaging  Assessment/Plan:  Principal Problem:   Empyema (HCC) Active Problems:   Pressure ulcer of sacrum   Ogilvie's syndrome   Protein-calorie malnutrition, severe   Refeeding syndrome   Pneumonia of right lung due to infectious organism   Bacteremia   Spastic quadriparesis (HCC)   Contractures involving both knees  Andres [name] is a 40 y.o. with PMH of intellectual disability, fetal alcohol syndrome, autism, seizure disorder, GERD admit for fall due to dehydration/malnourishment.   GOALS OF CARE Mr.Swarthout have been hospitalized for a month with multiple comorbidities and complications. His prognosis is extremely poor and at this point he is lacking any meaningful alternatives for curative treatment. Mr. Forbush has ongoing pneumonia with complicated para-pneumonic effusion without ability to obtain source control Ultimately, this may led to rapid deterioration. He is an appropriate candidate for transition to hospice care. He would be an appropriate candidate for DNR/DNI code status considering aggressive resuscitative efforts in the event of arrest would not likely to be successful nor led to meaningful recovery. Palliative care has  been in contact with patient's guardian and we are still awaiting their decision in regards to treatment course, comfort versus aggressive.   Community Acquired Pneumonia  Complicated Para-pneumonic Effusion  Day 23 of antibiotics, Day 6 of Cefepime. Leukocytosis downtrending to 23 from 30. Unlikely to obtain any control of infection without source control. Patient is not a candidate for intervention for source control though. At this time, plan to continue with antibiotic treatment. Of note, patient is high risk for C. Diff, so will need to be vigilant for changes in bowel movements.  - Continue Cefepime - Follow blood cultures  - Trend fever curve - Trend WBC  Refeeding syndrome, Improving Severe malnutrition, Persistent  Chronic intestinal pseudo-obstruction Potassium 3.2 today.  Phosphorus and magnesium normal. - Check electrolytes daily and replete as needed - Continue thiamine 100mg  daily  - RD following, appreciate recommendations: Boost, ensure, magic cup   # Normocytic Anemia  Recent history of upper GI bleed w/ severe esophagitis and AVM's that were resolved earlier in this hospitalization. No melena to suggest re-bleed. Iron panel obtained earlier this hospitalization with evidence of anemia of chronic disease. It is possible patient is having bone marrow suppression in light of acute illness, with now difficulty recovering after GI bleed. Hemoglobin has remained stable at this time.  - Trend hemoglobin  - Transfuse if Hgb < 7.0 - Monitor for bleed - Reticulocyte count pending   Diffuse Extremity Contractures Left upper extremity flaccid paralysis Multiple pressure ulcers - Continue splinting at night. Remove splints during the daytime - Concerning exam for lack of perfusion to left hand; monitor  closely, especially with splints on.  - Appreciate wound care recommendations    GERD w/ severe erosive esophagitis, chronic - Encourage good fluid intake with medications -  Protonix 40mg  twice daily, long-term  - Head of bed > 30 degrees at all times  DVT prophx: lovenox Diet: Regular Code: Full  Prior to Admission Living Arrangement: Home Anticipated Discharge Location: Hospice vs SNF Barriers to Discharge: Goals of care Dispo: Anticipated discharge undetermined.   , DO Internal Medicine PGY-2  Pager: [phone] After 5pm on weekdays and 1pm on weekends: On Call pager [phone]  08/04/2020, 1:06 PM

## 2020-08-05 ENCOUNTER — Inpatient Hospital Stay (HOSPITAL_COMMUNITY): Payer: Medicare Other

## 2020-08-05 DIAGNOSIS — R509 Fever, unspecified: Secondary | ICD-10-CM

## 2020-08-05 DIAGNOSIS — K5981 Ogilvie syndrome: Secondary | ICD-10-CM | POA: Diagnosis not present

## 2020-08-05 DIAGNOSIS — J90 Pleural effusion, not elsewhere classified: Secondary | ICD-10-CM | POA: Diagnosis not present

## 2020-08-05 DIAGNOSIS — J869 Pyothorax without fistula: Secondary | ICD-10-CM | POA: Diagnosis not present

## 2020-08-05 DIAGNOSIS — R609 Edema, unspecified: Secondary | ICD-10-CM

## 2020-08-05 DIAGNOSIS — Z7189 Other specified counseling: Secondary | ICD-10-CM

## 2020-08-05 LAB — COMPREHENSIVE METABOLIC PANEL
ALT: 41 U/L (ref 0–44)
AST: 41 U/L (ref 15–41)
Albumin: 1 g/dL — ABNORMAL LOW (ref 3.5–5.0)
Alkaline Phosphatase: 121 U/L (ref 38–126)
Anion gap: 7 (ref 5–15)
BUN: 11 mg/dL (ref 6–20)
CO2: 28 mmol/L (ref 22–32)
Calcium: 7.5 mg/dL — ABNORMAL LOW (ref 8.9–10.3)
Chloride: 100 mmol/L (ref 98–111)
Creatinine, Ser: 0.39 mg/dL — ABNORMAL LOW (ref 0.61–1.24)
GFR, Estimated: >60 mL/min (ref >60)
Glucose, Bld: 124 mg/dL — ABNORMAL HIGH (ref 70–99)
Potassium: 3.2 mmol/L — ABNORMAL LOW (ref 3.5–5.1)
Sodium: 135 mmol/L (ref 135–145)
Total Bilirubin: 0.2 mg/dL — ABNORMAL LOW (ref 0.3–1.2)
Total Protein: 5.5 g/dL — ABNORMAL LOW (ref 6.5–8.1)

## 2020-08-05 LAB — CBC
HCT: 26.1 % — ABNORMAL LOW (ref 39.0–52.0)
Hemoglobin: 8.4 g/dL — ABNORMAL LOW (ref 13.0–17.0)
MCH: 29.6 pg (ref 26.0–34.0)
MCHC: 32.2 g/dL (ref 30.0–36.0)
MCV: 91.9 fL (ref 80.0–100.0)
Platelets: 415 10*3/uL — ABNORMAL HIGH (ref 150–400)
RBC: 2.84 MIL/uL — ABNORMAL LOW (ref 4.22–5.81)
RDW: 14.2 % (ref 11.5–15.5)
WBC: 22.8 10*3/uL — ABNORMAL HIGH (ref 4.0–10.5)
nRBC: 0 % (ref 0.0–0.2)

## 2020-08-05 LAB — GLUCOSE, CAPILLARY: Glucose-Capillary: 124 mg/dL — ABNORMAL HIGH (ref 70–99)

## 2020-08-05 LAB — PHOSPHORUS: Phosphorus: 2.3 mg/dL — ABNORMAL LOW (ref 2.5–4.6)

## 2020-08-05 LAB — MAGNESIUM: Magnesium: 1.7 mg/dL (ref 1.7–2.4)

## 2020-08-05 MED ORDER — POTASSIUM PHOSPHATES 15 MMOLE/5ML IV SOLN
20.0000 mmol | Freq: Once | INTRAVENOUS | Status: AC
  Administered 2020-08-05: 20 mmol via INTRAVENOUS
  Filled 2020-08-05 (×2): qty 6.67

## 2020-08-05 MED ORDER — MAGNESIUM SULFATE 2 GM/50ML IV SOLN
2.0000 g | Freq: Once | INTRAVENOUS | Status: AC
  Administered 2020-08-05: 2 g via INTRAVENOUS
  Filled 2020-08-05: qty 50

## 2020-08-05 MED ORDER — POTASSIUM CHLORIDE 10 MEQ/100ML IV SOLN: 10.0000 meq | INTRAVENOUS | Status: DC

## 2020-08-05 MED ORDER — POTASSIUM CHLORIDE CRYS ER 20 MEQ PO TBCR
40.0000 meq | EXTENDED_RELEASE_TABLET | Freq: Once | ORAL | Status: AC
  Administered 2020-08-05: 40 meq via ORAL
  Filled 2020-08-05: qty 2

## 2020-08-05 NOTE — Progress Notes (Signed)
Voicemail left and text message sent to Lauris Poag [facility]) requesting follow-up with any decisions and questions.   Willette Alma, AGPCNP-BC Palliative Medicine Team  Phone: [phone]

## 2020-08-05 NOTE — Progress Notes (Signed)
Spoke with [name] (patient's Guardian) this afternoon who states she needs more documentation to provide her boss. Informed her our recommendations have not changed since two weeks prior and that a letter was sent at that time with two signatures. She states Dr. Marigene Ehlers documentation was helpful and asked for a letter to include those details. Explained that the documentation he provided is a progress note which entails the details of everything we are managing daily and that the letter is a great summary of that. She states she understands and will talk to her team today and give [place] a call back this afternoon. Will wait to hear back.  Dr. Lerry Liner PGY-2 IMTS

## 2020-08-05 NOTE — Progress Notes (Addendum)
Subjective:   Overnight, patient had fever to 100.4.  This morning, patient denies being in any pain but reports that he is sad. He repeatedly states, "eat" and "leave me alone." He does not endorse any other specific complaints.  Objective:  Vital signs in last 24 hours: Vitals:   08/04/20 2009 08/05/20 0023 08/05/20 0456 08/05/20 0742  BP: 106/73 109/70 117/73 115/78  Pulse: (!) 110 (!) 108 (!) 109 (!) 109  Resp: 20 20 20 16   Temp: (!) 100.4 F (38 C) 99.5 F (37.5 C) 97.9 F (36.6 C) 98 F (36.7 C)  TempSrc: Oral Oral Oral   SpO2: 98% 96% 94% 98%  Weight:      Height:      SpO2: 98 % O2 Flow Rate (L/min): 2 L/min  Intake/Output Summary (Last 24 hours) at 08/05/2020 0810 Last data filed at 08/05/2020 0538 Gross per 24 hour  Intake 640 ml  Output 1900 ml  Net -1260 ml   Filed Weights   07/18/20 0313 07/24/20 0331 07/25/20 0300  Weight: 45.3 kg 46.5 kg 46.2 kg   Physical Exam Vitals reviewed.  Constitutional:      General: He is not in acute distress.    Appearance: He is ill-appearing.  Cardiovascular:     Rate and Rhythm: Regular rhythm. Tachycardia present.     Pulses: Normal pulses.     Heart sounds: Normal heart sounds.  Pulmonary:     Effort: Pulmonary effort is normal. No respiratory distress.  Abdominal:     General: Abdomen is flat. Bowel sounds are normal.     Tenderness: There is no abdominal tenderness.  Musculoskeletal:        General: Deformity present. Normal range of motion.     Comments: Significant edema of the dorsal aspect of the left hand.  Skin:    General: Skin is warm and dry.  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  Psychiatric:     Comments: Tearful throughout examination    LABS: Glucose 24h - 124  CBC Latest Ref Rng & Units 08/05/2020 08/04/2020 08/03/2020  WBC 4.0 - 10.5 K/uL 22.8(H) 23.7(H) 30.3(H)  Hemoglobin 13.0 - 17.0 g/dL 10/01/2020) 8.1(L) 9.3(L)  Hematocrit 39.0 - 52.0 % 26.1(L) 24.0(L)  28.6(L)  Platelets 150 - 400 K/uL 415(H) 394 440(H)   CMP Latest Ref Rng & Units 08/05/2020 08/04/2020 08/03/2020  Glucose 70 - 99 mg/dL 10/01/2020) 017(C) 97  BUN 6 - 20 mg/dL 11 10 9   Creatinine 0.61 - 1.24 mg/dL 944(H) ) 6.75(F)  Sodium 135 - 145 mmol/L 135 136 135  Potassium 3.5 - 5.1 mmol/L 3.2(L) 3.2(L) 3.8  Chloride 98 - 111 mmol/L 100 103 98  CO2 22 - 32 mmol/L 28 28 28   Calcium 8.9 - 10.3 mg/dL 7.5(L) 7.2(L) 7.6(L)  Total Protein 6.5 - 8.1 g/dL 1.63(W) 5.2(L) 5.4(L)  Total Bilirubin 0.3 - 1.2 mg/dL 4.66(Z) 0.5 0.4  Alkaline Phos 38 - 126 U/L 121 89 115  AST 15 - 41 U/L 41 44(H) 47(H)  ALT 0 - 44 U/L 41 42 46(H)  Phos - 2.3 Mg - 1.7  IMAGING: No results found.  Assessment/Plan:  Principal Problem:   Empyema (HCC) Active Problems:   Pressure ulcer of sacrum   Ogilvie's syndrome   Protein-calorie malnutrition, severe   Refeeding syndrome   Pneumonia of right lung due to infectious organism   Bacteremia   Spastic quadriparesis (HCC)   Contractures involving both knees  Andres [name] is a 40 year old male with past medical history significant for intellectual disability, fetal alcohol syndrome, autism, seizure disorder and GERD who was admitted on 12/07 following a fall found to have severe malnutrition and dehydration with hospital course complicated by pneumonia with parapneumonic effusion/empyema without ability to obtain source control (on cefepime), goals of care conversations with patient's guardian and palliative care, refeeding syndrome and anemia.  #Goals of care, ongoing [name] have been hospitalized for a month with multiple comorbidities and complications. His prognosis is extremely poor and at this point he is lacking any meaningful alternatives for curative treatment. Andres [name] has ongoing pneumonia with complicated para-pneumonic effusion without ability to obtain source control. Ultimately, this may led to rapid deterioration. He is an appropriate  candidate for transition to hospice care. He would be an appropriate candidate for DNR/DNI code status considering aggressive resuscitative efforts in the event of arrest would not likely to be successful nor led to meaningful recovery. Palliative care has been repeatedly attempting to contact patient's guardian with minimal success. Our team is still awaiting their decision in regards to treatment course, comfort versus aggressive care.  #Community acquired pneumonia with complicated para-pneumonic effusion, active Patient is on day 24 of antibiotics, now on cefepime. Patient continues to have fevers (most recent 100.4) and leukocytosis. Unlikely to obtain any control of infection without source control, however patient is not a surgical candidate. Of note, patient is high risk for C. Diff, so will need to be vigilant for changes in bowel movements.  -Continue Cefepime -Trend fever curve -Daily CBC  #Left hand edema, active Patient noted to have significant edema of the dorsal aspect of his left hand on examination today. Although his albumin is less than 1, this unilateral swelling is concerning for a possible DVT. RN assessed double lumen PICC of left arm which is flushing well.  -Obtain left upper extremity venous duplex ultrasound -Continue enoxaparin 30mg  daily   #Refeeding syndrome, improving #Severe malnutrition, persistent  #Chronic intestinal pseudo-obstruction Potassium 3.2, phosphorous of 2.3, and magnesium of 1.7. -Daily BMP, Mg and Phos -Continue thiamine 100mg  daily  -RD following, appreciate recommendations: Boost, ensure, magic cup   #Normocytic anemia, chronic Hemoglobin stable at 8.4 most recently. No signs of active bleeding. -Daily CBC -Transfuse if Hgb < 7.0 -Monitor for bleed   #Diffuse Extremity Contractures, active #Left upper extremity flaccid paralysis, active #Multiple pressure ulcers, active - Continue splinting at night. Remove splints during the daytime -  Appreciate wound care recommendations    #GERD w/ severe erosive esophagitis, chronic - Encourage good fluid intake with medications - Protonix 40mg  twice daily, long-term  - Head of bed > 30 degrees at all times  #VTE ppx: enoxaparin 30mg  daily #Diet: Regular #Code status: Full (refer to goals of care problem) #Bowel regimen: None #PT/OT recs: SNF  Prior to Admission Living Arrangement: Home Anticipated Discharge Location: Hospice vs SNF Barriers to Discharge: Goals of care Dispo: Anticipated discharge undetermined.   , MD Internal Medicine PGY-1 Pager: [phone] After 5pm on weekdays and 1pm on weekends: On Call pager [phone]  08/05/2020, 8:10 AM

## 2020-08-05 NOTE — Progress Notes (Signed)
Upper extremity venous has been completed.   Preliminary results in CV Proc.   Blanch Media 08/05/2020 2:55 PM

## 2020-08-06 DIAGNOSIS — R509 Fever, unspecified: Secondary | ICD-10-CM

## 2020-08-06 DIAGNOSIS — J90 Pleural effusion, not elsewhere classified: Secondary | ICD-10-CM | POA: Diagnosis not present

## 2020-08-06 DIAGNOSIS — J869 Pyothorax without fistula: Secondary | ICD-10-CM | POA: Diagnosis not present

## 2020-08-06 DIAGNOSIS — R600 Localized edema: Secondary | ICD-10-CM

## 2020-08-06 DIAGNOSIS — K5981 Ogilvie syndrome: Secondary | ICD-10-CM | POA: Diagnosis not present

## 2020-08-06 DIAGNOSIS — J168 Pneumonia due to other specified infectious organisms: Secondary | ICD-10-CM | POA: Diagnosis not present

## 2020-08-06 DIAGNOSIS — B9620 Unspecified Escherichia coli [E. coli] as the cause of diseases classified elsewhere: Secondary | ICD-10-CM | POA: Diagnosis not present

## 2020-08-06 LAB — COMPREHENSIVE METABOLIC PANEL
ALT: 41 U/L (ref 0–44)
AST: 41 U/L (ref 15–41)
Albumin: 1.1 g/dL — ABNORMAL LOW (ref 3.5–5.0)
Alkaline Phosphatase: 123 U/L (ref 38–126)
Anion gap: 7 (ref 5–15)
BUN: 10 mg/dL (ref 6–20)
CO2: 28 mmol/L (ref 22–32)
Calcium: 7.8 mg/dL — ABNORMAL LOW (ref 8.9–10.3)
Chloride: 100 mmol/L (ref 98–111)
Creatinine, Ser: 0.44 mg/dL — ABNORMAL LOW (ref 0.61–1.24)
GFR, Estimated: >60 mL/min (ref >60)
Glucose, Bld: 111 mg/dL — ABNORMAL HIGH (ref 70–99)
Potassium: 3.7 mmol/L (ref 3.5–5.1)
Sodium: 135 mmol/L (ref 135–145)
Total Bilirubin: 0.4 mg/dL (ref 0.3–1.2)
Total Protein: 5.5 g/dL — ABNORMAL LOW (ref 6.5–8.1)

## 2020-08-06 LAB — CBC
HCT: 26.9 % — ABNORMAL LOW (ref 39.0–52.0)
Hemoglobin: 8.6 g/dL — ABNORMAL LOW (ref 13.0–17.0)
MCH: 29.4 pg (ref 26.0–34.0)
MCHC: 32 g/dL (ref 30.0–36.0)
MCV: 91.8 fL (ref 80.0–100.0)
Platelets: 375 10*3/uL (ref 150–400)
RBC: 2.93 MIL/uL — ABNORMAL LOW (ref 4.22–5.81)
RDW: 14.2 % (ref 11.5–15.5)
WBC: 22.5 10*3/uL — ABNORMAL HIGH (ref 4.0–10.5)
nRBC: 0 % (ref 0.0–0.2)

## 2020-08-06 LAB — GLUCOSE, CAPILLARY: Glucose-Capillary: 89 mg/dL (ref 70–99)

## 2020-08-06 LAB — PHOSPHORUS: Phosphorus: 2.9 mg/dL (ref 2.5–4.6)

## 2020-08-06 LAB — MAGNESIUM: Magnesium: 1.6 mg/dL — ABNORMAL LOW (ref 1.7–2.4)

## 2020-08-06 MED ORDER — MAGNESIUM SULFATE 2 GM/50ML IV SOLN
2.0000 g | Freq: Once | INTRAVENOUS | Status: AC
  Administered 2020-08-06: 2 g via INTRAVENOUS
  Filled 2020-08-06: qty 50

## 2020-08-06 MED ORDER — SODIUM CHLORIDE 0.9% FLUSH: 10.0000 mL | INTRAVENOUS | Status: DC | PRN

## 2020-08-06 MED ORDER — POTASSIUM CHLORIDE CRYS ER 20 MEQ PO TBCR
40.0000 meq | EXTENDED_RELEASE_TABLET | Freq: Every day | ORAL | Status: DC
  Administered 2020-08-06 – 2020-08-07 (×2): 40 meq via ORAL
  Filled 2020-08-06: qty 2
  Filled 2020-08-06: qty 4

## 2020-08-06 NOTE — Progress Notes (Signed)
   Spoke with guardian, [name] and provided updates on patient's continued guarded state and poor prognosis.   Amelia requested DNR form completed to support her team's decisions for patient. Form completed with co-signature by Dr. Phillips Odor. Attending team also completed form and both were returned.   Lauris Poag confirms the [facility] [place] is consenting for DNR/DNI status. We discussed what this would look like for patient.   Education provided along with recommendations for transitioning care to focus on full comfort given Mr. Farrel poor prognosis and poor quality of life. Amelia verbalized understanding and request addition discussions with her team.   MOST form sent to Mrs. Patel and education provided on each section including recommendations. She verbalized understanding and will forward MOST form to her leader.   All questions answered and support provided.   Time Total: 40 min.   Visit consisted of counseling and education dealing with the complex and emotionally intense issues of symptom management and palliative care in the setting of serious and potentially life-threatening illness.Greater than 50%  of this time was spent counseling and coordinating care related to the above assessment and plan.  Willette Alma, AGPCNP-BC  Palliative Medicine Team [phone]    Willette Alma, AGPCNP-BC Palliative Medicine Team  Phone: [phone] Pager: [phone] Amion: N. Cousar   NO CHARGE

## 2020-08-06 NOTE — Progress Notes (Signed)
Occupational Therapy Treatment Patient Details Name: Mabry Tift MRN: [id number] DOB: [birth year] Today's Date: 08/06/2020    History of present illness Patient admitted on 07/01/20 after unwhitnessed fall. Extended hospital stay complicated by pneumonia with pleural effusion, E coli bacteremia, upper GI bleed, and LUE flaccidity with concern for R MCA vs. TIA 2/2 complex medical Hx. vs. LUE/plexus/mechanical etiology of LUE weakness. Imaging (-) for acute findings. Patient with continued LUE weakness. PMHx significant for fetal alcohol symdrome with cognitive impairment, Autism, non-verbal, and behaviors.   OT comments  Patient met lying supine in bed. Continued concern for skin integrity noting edematous RUE and RLE. Upon further assessment of skin, patient found to have deformity and hyperextension of 1st digit of L foot, erythema, swelling, and drainage noted on bed sheets. Patient with facial grimacing during passive movement. Session terminated and RN notified via secure chat as OT unable to locate nurse of floor. Patient continues to be limited by increased severity in BUE/BLE extremity contractures with edema, LUE flaccidity, and multiple wounds with poor prognosis. OT will continue to follow acutely until splinting and positioning goals are met.     Follow Up Recommendations  Other (comment) (LTC)    Equipment Recommendations  Hospital bed;Other (comment) (air bed/matress, hoyer lift)    Recommendations for Other Services      Precautions / Restrictions Precautions Precautions: Fall Precaution Comments: High Fall Risk; seizures, worsening bilateral leg contractures, spasticity in RUE/RLE/LLE. Flaccidity in LUE (not present upon admission), poor skin integrity Required Braces or Orthoses: Other Brace Other Brace: bilateral resting hand splints to be worn 8 hours during day with skin check every 3-4 hours Restrictions Weight Bearing Restrictions: No       Mobility Bed  Mobility Overal bed mobility: Needs Assistance                Transfers                 General transfer comment: Bed level activity only this date for splint check.    Balance                                           ADL either performed or assessed with clinical judgement   ADL                                               Vision       Perception     Praxis      Cognition Arousal/Alertness: Awake/alert Behavior During Therapy: Flat affect Overall Cognitive Status: History of cognitive impairments - at baseline                                 General Comments: Patient responds appropriately to OT's greeting upon entry.        Exercises     Shoulder Instructions       General Comments 1st digit of LLE appears to be hyperextended with dark red appearance. Discharge from toe seeping onto sheets. RN notified immediately and treatment session terminated.    Pertinent Vitals/ Pain       Pain Assessment: Faces Faces Pain Scale: Hurts even more Pain Location: Generalized discomfort with  movement Pain Descriptors / Indicators: Discomfort;Grimacing Pain Intervention(s): Monitored during session  Home Living                                          Prior Functioning/Environment              Frequency  Min 1X/week        Progress Toward Goals  OT Goals(current goals can now be found in the care plan section)  Progress towards OT goals: Not progressing toward goals - comment (Little carryover with splint schedule or positioning to decrease risk of furthter contractures or skin breakdown.)  Acute Rehab OT Goals Patient Stated Goal: Unable to state.  OT Goal Formulation: Patient unable to participate in goal setting Time For Goal Achievement: 08/20/20 Potential to Achieve Goals: Poor ADL Goals Pt Will Perform Eating: sitting;with mod assist Pt Will Perform Grooming:  sitting;with mod assist Additional ADL Goal #1: Staff will perform PROM to BUE/BLE to prevent further contractures and decrease risk of skin breakdown. Additional ADL Goal #2: Staff will reposition patient every 2 hours to maintain skin integrity and prevent further skin breakdwon. Additional ADL Goal #3: Nursing staff with apply bilateral resting hand splints daily for 8hrs with skin checks every 2-3hrs to assess skin integrity.  Plan Discharge plan needs to be updated;Frequency remains appropriate    Co-evaluation                 AM-PAC OT "6 Clicks" Daily Activity     Outcome Measure   Help from another person eating meals?: Total Help from another person taking care of personal grooming?: Total Help from another person toileting, which includes using toliet, bedpan, or urinal?: Total Help from another person bathing (including washing, rinsing, drying)?: Total Help from another person to put on and taking off regular upper body clothing?: Total Help from another person to put on and taking off regular lower body clothing?: Total 6 Click Score: 6    End of Session    OT Visit Diagnosis: Unsteadiness on feet (R26.81);Muscle weakness (generalized) (M62.81);Pain;Adult, failure to thrive (R62.7);Other (comment) Pain - Right/Left: Left Pain - part of body: Ankle and joints of foot   Activity Tolerance     Patient Left in bed;with call bell/phone within reach;with bed alarm set   Nurse Communication Other (comment) (Positioning needs, splinting schedule, and appearance of 1st digit of L foot.)        Time: 7989-2119 OT Time Calculation (min): 11 min  Charges: OT General Charges $OT Visit: 1 Visit OT Treatments $Therapeutic Activity: 8-22 mins  Matalynn Graff H. OTR/L Supplemental OT, Department of rehab services [phone]   Marty Uy R H. 08/06/2020, 12:40 PM

## 2020-08-06 NOTE — Progress Notes (Signed)
Subjective:  Overnight, no acute events.  This morning, patient responds to "how are you doing?" with "eat." He denies pain or any new complaints.  Objective:  Vital signs in last 24 hours: Vitals:   08/05/20 2025 08/05/20 2316 08/06/20 0350 08/06/20 0917  BP: 110/75 110/75 119/82 115/71  Pulse: (!) 101 (!) 109 (!) 111 (!) 109  Resp: 18 17 18 18   Temp: 98.8 F (37.1 C) 98.3 F (36.8 C) 97.8 F (36.6 C) 98 F (36.7 C)  TempSrc: Oral  Oral Oral  SpO2: 96% 96% 98% 99%  Weight:      Height:      SpO2: 99 % O2 Flow Rate (L/min): 2 L/min  Intake/Output Summary (Last 24 hours) at 08/06/2020 1242 Last data filed at 08/06/2020 0930 Gross per 24 hour  Intake 610 ml  Output --  Net 610 ml   Filed Weights   07/18/20 0313 07/24/20 0331 07/25/20 0300  Weight: 45.3 kg 46.5 kg 46.2 kg   Physical Exam Vitals reviewed.  Constitutional:      General: He is not in acute distress.    Appearance: He is ill-appearing.  Cardiovascular:     Rate and Rhythm: Regular rhythm. Tachycardia present.     Pulses: Normal pulses.     Heart sounds: Normal heart sounds.  Pulmonary:     Effort: Pulmonary effort is normal. No respiratory distress.  Abdominal:     General: Abdomen is flat. Bowel sounds are normal.     Tenderness: There is no abdominal tenderness.  Musculoskeletal:        General: Deformity present. Normal range of motion.     Comments: Significant edema of the dorsal aspect of the left hand.  Skin:    General: Skin is warm and dry.  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    LABS: CBC Latest Ref Rng & Units 08/06/2020 08/05/2020 08/04/2020  WBC 4.0 - 10.5 K/uL 22.5(H) 22.8(H) 23.7(H)  Hemoglobin 13.0 - 17.0 g/dL 10/02/2020) 7.8(E) 8.1(L)  Hematocrit 39.0 - 52.0 % 26.9(L) 26.1(L) 24.0(L)  Platelets 150 - 400 K/uL 375 415(H) 394   CMP Latest Ref Rng & Units 08/06/2020 08/05/2020 08/04/2020  Glucose  70 - 99 mg/dL 10/02/2020) 536(R) 443(X)  BUN 6 - 20 mg/dL 10 11 10   Creatinine 0.61 - 1.24 mg/dL 540(G) ) 8.67(Y)  Sodium 135 - 145 mmol/L 135 135 136  Potassium 3.5 - 5.1 mmol/L 3.7 3.2(L) 3.2(L)  Chloride 98 - 111 mmol/L 100 100 103  CO2 22 - 32 mmol/L 28 28 28   Calcium 8.9 - 10.3 mg/dL 7.8(L) 7.5(L) 7.2(L)  Total Protein 6.5 - 8.1 g/dL 1.95(K) 9.32(I) 5.2(L)  Total Bilirubin 0.3 - 1.2 mg/dL 0.4 ) 0.5  Alkaline Phos 38 - 126 U/L 123 121 89  AST 15 - 41 U/L 41 41 44(H)  ALT 0 - 44 U/L 41 41 42  Phos - 2.9 Mg - 1.6 Glucose 24h - 89  IMAGING: VAS 7.1(I UPPER EXTREMITY VENOUS DUPLEX  Result Date: 08/05/2020 Summary:  Left: No evidence of deep vein thrombosis in the upper extremity. No evidence of superficial vein thrombosis in the upper extremity. No evidence of thrombosis in the subclavian.  *See table(s) above for measurements and observations.  Diagnosing physician: 0.9(X Electronically signed by [place] on 08/05/2020 at 7:19:49 PM.    Final    Assessment/Plan:  Principal Problem:   Empyema (HCC) Active Problems:   Pressure ulcer of sacrum   Ogilvie's syndrome   Protein-calorie malnutrition, severe   Refeeding syndrome   Pneumonia of right lung due to infectious organism   Bacteremia   Spastic quadriparesis (HCC)   Contractures involving both knees   Fever   Goals of care, counseling/discussion  Andres [name] is a 40 year old male with past medical history significant for intellectual disability, fetal alcohol syndrome, autism, seizure disorder and GERD who was admitted on 12/07 following a fall found to have severe malnutrition and dehydration with hospital course complicated by pneumonia with parapneumonic effusion/empyema without ability to obtain source control (on cefepime), goals of care conversations with patient's guardian and palliative care, refeeding syndrome and anemia.  #Goals of care, ongoing Andres [name] have been hospitalized for thirty five  days with multiple comorbidities and complications. His prognosis is extremely poor and at this point he is lacking any meaningful alternatives for curative treatment. Andres [name] has ongoing pneumonia with complicated para-pneumonic effusion without ability to obtain source control. Ultimately, this may led to rapid deterioration. He is an appropriate candidate for transition to hospice care. He would be an appropriate candidate for DNR/DNI code status considering aggressive resuscitative efforts in the event of arrest would not likely to be successful nor led to meaningful recovery. Our team and palliative care have been in contact with patient's state appointed guardian who is requesting additional documentation to support DNR/DNI status. -Complete guardian requested DNR paperwork  #Community acquired pneumonia with complicated para-pneumonic effusion, active Patient is on day 26 of antibiotics, cefepime most recently. No fevers overnight. Unlikely to obtain any control of infection without source control, however patient is not a surgical candidate. Of note, patient is high risk for C. Diff, so will need to be vigilant for changes in bowel movements.  -Continue Cefepime -Trend fever curve -Daily CBC  #Left hand edema, active Patient continues to have significant edema of the left hand. RN assessed double lumen PICC of left arm which is flushing well. Left upper extremity venous duplex ultrasound unremarkable. -Continue to monitor  #Refeeding syndrome, improving #Severe malnutrition, persistent  #Chronic intestinal pseudo-obstruction Potassium 3.7, phosphorous of 2.9, and magnesium of 1.6. -Daily BMP, Mg and Phos -Continue thiamine 100mg  daily  -RD following, appreciate recommendations: Boost, ensure, magic cup   #Normocytic anemia, chronic Hemoglobin stable at 8.6 most recently. No signs of active bleeding. -Daily CBC -Transfuse if Hgb < 7.0 -Monitor for bleed  #GERD w/ severe erosive  esophagitis, chronic - Encourage good fluid intake with medications - Protonix 40mg  twice daily, long-term  - Head of bed > 30 degrees at all times  #VTE ppx: enoxaparin 30mg  daily #Diet: Regular #Code status: Full (refer to goals of care problem) #Bowel regimen: None #PT/OT recs: SNF  Prior to Admission Living Arrangement: Home Anticipated Discharge Location: Hospice vs SNF Barriers to Discharge: Goals of care Dispo: Anticipated discharge undetermined.   , MD Internal Medicine PGY-1 Pager: [phone] After 5pm on weekdays and 1pm on weekends: On Call pager [phone]  08/06/2020, 12:42 PM

## 2020-08-06 NOTE — Progress Notes (Signed)
Nutrition Follow-up  DOCUMENTATION CODES:   Severe malnutrition in context of chronic illness  INTERVENTION:   Continue snacks TID  ContinueEnsure Enlive poTID, each supplement provides 350 kcal and 20 grams of protein  ContinueMagic cup TID with meals, each supplement provides 290 kcal and 9 grams of protein  ContinueMVI with minerals daily   NUTRITION DIAGNOSIS:   Severe Malnutrition related to chronic illness (fetal alcohol syndrome and autism) as evidenced by severe muscle depletion,severe fat depletion.  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 Pt found to have upper GI bleed 2/2 angiodysplasia (resolved) and acute normocytic anemia (resolved). 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 Per MD, hiatal hernia and Grade D esophageal reflux may be contributing to chronic vomiting. Follow up gastrografin follow through study showed normal passage through the colon and rectum. Pt was treated conservatively with NGT decompression and N/V have since resolved.   Pt mostly nonverbal at baseline.  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 Biopsied.  12/13- diet advanced to soft; GI signed off 12/14- diet advanced to regular 12/23- R thoracentesis due to pleural effusion  Pt with CAP w/ complicated para-pneumonic effusion and has been intermittently  febrile. Pt afebrile overnight and today. Pt continues to have significant edema of the L hand.  Medical team has been attempting to communicate continued recommendation for transition to DNR/comfort measures, but they have had significant difficulty reaching the care giver. MD noted that care giver has once again requested to have additional documentation to further support transition of pt's status/care plan. Care giver was supposed to provide her team's response to MD recommendations yesterday, but it does not appear that this occurred. Will continue to monitor for results of GOC discussions.  Pt continues to have good po intake, 85-100% completion x last 7 recorded meals (96% average intake). Per RN, pt also continues to have good supplement consumption. Will continue current nutrition plan of care.  No UOP documented.  Admit wt: 46.8 kg Current wt: 46.2 kg (07/25/20)  Labs: Mg 1.6 (L) Medications: lactaid, mvi with minerals, klor-con, thiamine  Diet Order:   Diet Order            Diet regular Room service appropriate? Yes; Fluid consistency: Thin  Diet effective now                 EDUCATION NEEDS:   Not appropriate for education at this time  Skin:  Skin Assessment: Skin Integrity Issues: Skin Integrity Issues:: Stage I,Stage II,Other (Comment) Stage I: rt proximal hip Stage II: sacrum, rt proximal hip, left lateral back Other: puncture back  Last BM:  1/11 type 4  Height:   Ht Readings from Last 1 Encounters:  07/07/20 5\' 6"  (1.676 m)    Weight:   Wt Readings from Last 1 Encounters:  07/25/20 46.2 kg    Ideal Body Weight:  61.8 kg  BMI:  Body mass index is 16.44 kg/m.  Estimated Nutritional Needs:   Kcal:  1850-2050  Protein:  90-105 grams  Fluid:  > 1.8 L    Eugene Gavia, MS, RD, LDN RD pager number and weekend/on-call pager number located in [place].

## 2020-08-06 NOTE — Plan of Care (Signed)
Pt code status changed today. No significant changes from baseline. 1 large void this am. 500cc UO this afternoon. PICC dressing changed by IV team. Magnesium supplemented IV. 1 medium BM. CHG bath given. No calls/visitors to RN today.    Problem: Nutrition: Goal: Adequate nutrition will be maintained Outcome: Progressing   Problem: Coping: Goal: Level of anxiety will decrease Outcome: Progressing   Problem: Elimination: Goal: Will not experience complications related to bowel motility Outcome: Progressing Goal: Will not experience complications related to urinary retention Outcome: Progressing   Problem: Pain Managment: Goal: General experience of comfort will improve Outcome: Progressing   Problem: Safety: Goal: Ability to remain free from injury will improve Outcome: Progressing

## 2020-08-06 NOTE — Progress Notes (Signed)
Received documentation from patient's guardian [name] this afternoon stating that [facility] gives consent for DNR status.   Per her email, "[facility] is giving consent for A DNR to be put in place for Andres [name], [name]."  Code status is changed to DNR at this time.  Dr. Lerry Liner PGY-2 IMTS  08/06/2020 4:16 pm

## 2020-08-07 DIAGNOSIS — J168 Pneumonia due to other specified infectious organisms: Secondary | ICD-10-CM | POA: Diagnosis not present

## 2020-08-07 DIAGNOSIS — B9620 Unspecified Escherichia coli [E. coli] as the cause of diseases classified elsewhere: Secondary | ICD-10-CM | POA: Diagnosis not present

## 2020-08-07 DIAGNOSIS — R4182 Altered mental status, unspecified: Secondary | ICD-10-CM | POA: Diagnosis not present

## 2020-08-07 DIAGNOSIS — J90 Pleural effusion, not elsewhere classified: Secondary | ICD-10-CM | POA: Diagnosis not present

## 2020-08-07 DIAGNOSIS — M24561 Contracture, right knee: Secondary | ICD-10-CM | POA: Diagnosis not present

## 2020-08-07 DIAGNOSIS — J869 Pyothorax without fistula: Secondary | ICD-10-CM | POA: Diagnosis not present

## 2020-08-07 DIAGNOSIS — I639 Cerebral infarction, unspecified: Secondary | ICD-10-CM | POA: Diagnosis not present

## 2020-08-07 LAB — COMPREHENSIVE METABOLIC PANEL
ALT: 41 U/L (ref 0–44)
AST: 49 U/L — ABNORMAL HIGH (ref 15–41)
Albumin: <1 g/dL — ABNORMAL LOW (ref 3.5–5.0)
Alkaline Phosphatase: 119 U/L (ref 38–126)
Anion gap: 7 (ref 5–15)
BUN: 13 mg/dL (ref 6–20)
CO2: 28 mmol/L (ref 22–32)
Calcium: 7.4 mg/dL — ABNORMAL LOW (ref 8.9–10.3)
Chloride: 100 mmol/L (ref 98–111)
Creatinine, Ser: 0.42 mg/dL — ABNORMAL LOW (ref 0.61–1.24)
GFR, Estimated: >60 mL/min (ref >60)
Glucose, Bld: 115 mg/dL — ABNORMAL HIGH (ref 70–99)
Potassium: 3.9 mmol/L (ref 3.5–5.1)
Sodium: 135 mmol/L (ref 135–145)
Total Bilirubin: <0.1 mg/dL — ABNORMAL LOW (ref 0.3–1.2)
Total Protein: 5.1 g/dL — ABNORMAL LOW (ref 6.5–8.1)

## 2020-08-07 LAB — CBC
HCT: 24.1 % — ABNORMAL LOW (ref 39.0–52.0)
Hemoglobin: 7.9 g/dL — ABNORMAL LOW (ref 13.0–17.0)
MCH: 29.8 pg (ref 26.0–34.0)
MCHC: 32.8 g/dL (ref 30.0–36.0)
MCV: 90.9 fL (ref 80.0–100.0)
Platelets: 306 10*3/uL (ref 150–400)
RBC: 2.65 MIL/uL — ABNORMAL LOW (ref 4.22–5.81)
RDW: 14.4 % (ref 11.5–15.5)
WBC: 17.7 10*3/uL — ABNORMAL HIGH (ref 4.0–10.5)
nRBC: 0 % (ref 0.0–0.2)

## 2020-08-07 LAB — PHOSPHORUS: Phosphorus: 3.1 mg/dL (ref 2.5–4.6)

## 2020-08-07 LAB — GLUCOSE, CAPILLARY: Glucose-Capillary: 93 mg/dL (ref 70–99)

## 2020-08-07 LAB — MAGNESIUM: Magnesium: 1.8 mg/dL (ref 1.7–2.4)

## 2020-08-07 MED ORDER — POLYVINYL ALCOHOL 1.4 % OP SOLN
1.0000 [drp] | Freq: Four times a day (QID) | OPHTHALMIC | Status: DC | PRN
  Filled 2020-08-07: qty 15

## 2020-08-07 MED ORDER — GLYCOPYRROLATE 0.2 MG/ML IJ SOLN
0.2000 mg | INTRAMUSCULAR | Status: DC | PRN
  Administered 2020-08-09 – 2020-08-19 (×5): 0.2 mg via INTRAVENOUS
  Filled 2020-08-07 (×5): qty 1

## 2020-08-07 MED ORDER — MORPHINE SULFATE (PF) 2 MG/ML IV SOLN
1.0000 mg | INTRAVENOUS | Status: DC | PRN
Start: 2020-08-07 — End: 2020-08-21
  Administered 2020-08-08 – 2020-08-18 (×7): 1 mg via INTRAVENOUS
  Filled 2020-08-07 (×8): qty 1

## 2020-08-07 MED ORDER — ONDANSETRON HCL 4 MG/2ML IJ SOLN
4.0000 mg | Freq: Four times a day (QID) | INTRAMUSCULAR | Status: DC | PRN
  Administered 2020-08-09 – 2020-08-11 (×2): 4 mg via INTRAVENOUS
  Filled 2020-08-07 (×2): qty 2

## 2020-08-07 MED ORDER — HALOPERIDOL LACTATE 5 MG/ML IJ SOLN: 0.5000 mg | INTRAMUSCULAR | Status: DC | PRN

## 2020-08-07 MED ORDER — MORPHINE SULFATE (CONCENTRATE) 10 MG/0.5ML PO SOLN
5.0000 mg | ORAL | Status: DC | PRN
  Administered 2020-08-14 – 2020-08-15 (×2): 5 mg via SUBLINGUAL
  Filled 2020-08-07 (×2): qty 0.5

## 2020-08-07 MED ORDER — MORPHINE SULFATE (CONCENTRATE) 10 MG/0.5ML PO SOLN
5.0000 mg | ORAL | Status: DC | PRN
  Administered 2020-08-09 – 2020-08-18 (×3): 5 mg via ORAL
  Filled 2020-08-07 (×3): qty 0.5

## 2020-08-07 NOTE — Progress Notes (Signed)
Subjective:  Overnight, no acute events.  This morning, patient initially sleeping on our arrival. Patient opened eyes after encouragement and looking around the room.Does not clearly endorse any acute complaints.  Objective:  Vital signs in last 24 hours: Vitals:   08/07/20 0000 08/07/20 0433 08/07/20 0746 08/07/20 1230  BP: 96/62 92/60 95/65  103/67  Pulse: (!) 107 (!) 106 (!) 102 (!) 108  Resp: 19 18 18 18   Temp: (!) 100.7 F (38.2 C) (!) 97.5 F (36.4 C) 98.7 F (37.1 C) 97.6 F (36.4 C)  TempSrc: Axillary Oral Oral Oral  SpO2: 96% 94% 91% 94%  Weight:      Height:      SpO2: 94 % O2 Flow Rate (L/min): 2 L/min  Intake/Output Summary (Last 24 hours) at 08/07/2020 1259 Last data filed at 08/07/2020 1235 Gross per 24 hour  Intake 480 ml  Output 1300 ml  Net -820 ml   Filed Weights   07/18/20 0313 07/24/20 0331 07/25/20 0300  Weight: 45.3 kg 46.5 kg 46.2 kg   Physical Exam Vitals reviewed.  Constitutional:      General: He is not in acute distress.    Appearance: He is ill-appearing.  Cardiovascular:     Rate and Rhythm: Regular rhythm. Tachycardia present.     Pulses: Normal pulses.     Heart sounds: Normal heart sounds.  Pulmonary:     Effort: Pulmonary effort is normal. No respiratory distress.  Abdominal:     General: Abdomen is flat. Bowel sounds are normal.     Tenderness: There is no abdominal tenderness.  Musculoskeletal:        General: Deformity present. Normal range of motion.     Comments: Significant edema of the dorsal aspect of the left hand.  Skin:    General: Skin is warm and dry.  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    LABS: CBC Latest Ref Rng & Units 08/07/2020 08/06/2020 08/05/2020  WBC 4.0 - 10.5 K/uL 17.7(H) 22.5(H) 22.8(H)  Hemoglobin 13.0 - 17.0 g/dL 7.9(L) 8.6(L) 8.4(L)  Hematocrit 39.0 - 52.0 % 24.1(L) 26.9(L) 26.1(L)  Platelets 150 - 400  K/uL 306 375 415(H)   CMP Latest Ref Rng & Units 08/07/2020 08/06/2020 08/05/2020  Glucose 70 - 99 mg/dL 10/04/2020) 10/03/2020) 923(R)  BUN 6 - 20 mg/dL 13 10 11   Creatinine 0.61 - 1.24 mg/dL 007(M) 226(J) )  Sodium 135 - 145 mmol/L 135 135 135  Potassium 3.5 - 5.1 mmol/L 3.9 3.7 3.2(L)  Chloride 98 - 111 mmol/L 100 100 100  CO2 22 - 32 mmol/L 28 28 28   Calcium 8.9 - 10.3 mg/dL 7.4(L) 7.8(L) 7.5(L)  Total Protein 6.5 - 8.1 g/dL 5.1(L) 5.5(L) 5.5(L)  Total Bilirubin 0.3 - 1.2 mg/dL 3.35(K) 0.4 5.62(B)  Alkaline Phos 38 - 126 U/L 119 123 121  AST 15 - 41 U/L 49(H) 41 41  ALT 0 - 44 U/L 41 41 41  Phos - 3.1 Mg - 1.8 Glucose 24h - 89  IMAGING: No results found.  Assessment/Plan:  Principal Problem:   Empyema (HCC) Active Problems:   Pressure ulcer of sacrum   Ogilvie's syndrome   Protein-calorie malnutrition, severe   Refeeding syndrome   Pneumonia of right lung due to infectious organism   Bacteremia   Spastic quadriparesis (HCC)   Contractures involving both knees   Fever   Goals  of care, counseling/discussion  Andres [name] is a 40 year old male with past medical history significant for intellectual disability, fetal alcohol syndrome, autism, seizure disorder and GERD who was admitted on 12/07 following a fall found to have severe malnutrition and dehydration with hospital course complicated by pneumonia with parapneumonic effusion/empyema without ability to obtain source control (on cefepime), goals of care conversations with patient's guardian and palliative care, refeeding syndrome and anemia.  #Comfort care Andres [name] is currently on day 64 of hospitalization. IMTS and palliative care have had extensive discussions over the past several weeks with patient's caregiver, Andres [name], and caregiver's team who have come to the agreement that patient would benefit from comfort measures given the severity of his condition, lack of meaningful recovery and overall poor prognosis.  Patient to be transitioned to comfort care today. Palliative care team following, appreciate recommendations. -Appreciate palliative care team recommendations -Transition to comfort care  #Community acquired pneumonia with complicated para-pneumonic effusion, active Patient has been on IV antibiotics for over 25 days with continued fevers, leukocytosis and cough. IR has attempted chest tube placement and thoracentesis without improvement of his infection. CT surgery determined patient to not be a surgical candidate. IV antibiotics will be discontinued today as patient transitioning to comfort measures. -Discontinue IV antibiotics  Diet: Regular Code status: Comfort care, DNR Prior to Admission Living Arrangement: Home Anticipated Discharge Location: Hospice Barriers to Discharge: Placement   Andres December, MD Internal Medicine PGY-1 Pager: [phone] After 5pm on weekdays and 1pm on weekends: On Call pager [phone]  08/07/2020, 12:59 PM

## 2020-08-07 NOTE — Progress Notes (Signed)
Daily Progress Note   Patient Name: Andres [name]       Date: 08/07/2020 DOB: 1980-09-28  Age: 40 y.o. MRN#: [id number] Attending Physician: Axel Filler, * Primary Care Physician: Lin Landsman, MD Admit Date: 07/01/2020  Reason for Consultation/Follow-up: Establishing goals of care, Non pain symptom management, Pain control and Psychosocial/spiritual support  Chart reviewed and Updates received.   Prognosis remains poor. No response to IV antibiotics in the setting of severe pneumonia. Extremely deconditioned with bilateral lower extremity contractures and severe protein calorie malnutrition. Patient is non-verbal at baseline.   Phone conference with patient's guardian Caryl Pina). Updates provided with continued recommendations for comfort care measures. Clyde Canterbury confirms teams wishes for DNR/DNI and transitioning all care focus on [name]' comfort.   Extensive education provided on what comfort care measures will look like for patient. Education provided on hospice and their philosophy of care. Clyde Canterbury is aware patient may continue to receive comfort care at a nursing facility or residential hospice facility. She verbalized understanding.   We reviewed the MOST document in detail.  I completed a MOST form today with Amelia. Form signed by myself and guardian. The guardian outlined their wishes for the following treatment decisions:  Cardiopulmonary Resuscitation: Do Not Attempt Resuscitation (DNR/No CPR)  Medical Interventions: Comfort Measures: Keep clean, warm, and dry. Use medication by any route, positioning, wound care, and other measures to relieve pain and suffering. Use oxygen, suction and manual treatment of airway obstruction as needed for comfort. Do not transfer to the hospital unless comfort needs cannot be met in current location.  Antibiotics: No antibiotics (use other measures to relieve symptoms)  IV Fluids: No IV fluids (provide other measures to ensure  comfort)  Feeding Tube: No feeding tube    Length of Stay: 36 days  Vital Signs: BP 103/67 (BP Location: Right Arm)   Pulse (!) 108   Temp 97.6 F (36.4 C) (Oral)   Resp 18   Ht '5\' 6"'  (1.676 m)   Wt 46.2 kg   SpO2 94%   BMI 16.44 kg/m  SpO2: SpO2: 94 % O2 Device: O2 Device: Room Air O2 Flow Rate: O2 Flow Rate (L/min): 2 L/min           Palliative Care Assessment & Plan    Code Status:  DNR  Goals of Care/Recommendations:  Transition all care to focus on comfort as confirmed and requested by guardian, Clyde Canterbury.   Will d/c all orders not focused on comfort.   Referral for residential hospice vs. SNF with hospice support.  Morphine PRN for pain/air hunger/comfort Robinul PRN for excessive secretions Ativan PRN for agitation/anxiety Zofran PRN for nausea Liquifilm tears PRN for dry eyes Haldol PRN for agitation/anxiety May have comfort feeding Unrestricted visitations in the setting of EOL (per policy) Oxygen PRN 2L or less for comfort. No escalation.   PMT will continue to support and follow as needed.   Prognosis:POOR (days-weeks)   Discharge Planning: [facility] with Hospice vs hospice home.   Thank you for allowing the Palliative Medicine Team to assist in the care of this patient.  Time Total: 50 min.   Visit consisted of counseling and education dealing with the complex and emotionally intense issues of symptom management and palliative care in the setting of serious and potentially life-threatening illness.Greater than 50%  of this time was spent counseling and coordinating care related to the above assessment and plan.  Alda Lea, AGPCNP-BC  Palliative Medicine Team [phone]

## 2020-08-08 DIAGNOSIS — J869 Pyothorax without fistula: Secondary | ICD-10-CM | POA: Diagnosis not present

## 2020-08-08 DIAGNOSIS — Z66 Do not resuscitate: Secondary | ICD-10-CM

## 2020-08-08 DIAGNOSIS — D62 Acute posthemorrhagic anemia: Secondary | ICD-10-CM | POA: Diagnosis not present

## 2020-08-08 DIAGNOSIS — I639 Cerebral infarction, unspecified: Secondary | ICD-10-CM | POA: Diagnosis not present

## 2020-08-08 DIAGNOSIS — R4182 Altered mental status, unspecified: Secondary | ICD-10-CM | POA: Diagnosis not present

## 2020-08-08 NOTE — Progress Notes (Signed)
[employer] engineer, contracting [facility]) Hospital Liaison note.    Received request from [facility] manager for family interest in [facility]. Spoke with legal guardian Andres [name] to confirm interest and to discuss possibility of hospice support in a long term care facility should the patient be deemed not yet eligible for residential hospice.  Ms Allena Katz verbalized understanding. Chart and pt information under review by [facility] physician.  Hospice eligibility pending at this time.  [facility] is unable to offer a room today. Hospital Liaison will follow up tomorrow or sooner if a room becomes available. Please do not hesitate to call with questions.    Thank you for the opportunity to participate in this patient's care.  Gillian Scarce, BSN, RN [facility] Liaison (listed on [place] under Hospice/[facility])    [phone] (24h on call)

## 2020-08-08 NOTE — Progress Notes (Addendum)
   Chart reviewed and updates received. Comfort care. Per RN some dyspnea this morning. Resolved with PRN medications and 2L oxygen support for comfort.   Updates provided to guardian, Lauris Poag. She verbalized understanding.   Education provided regarding continued support and care focusing on symptom management.   All questions answered.   Recommendations -Continue Comfort measures only -Symptom management with PRN medications -no escalation of oxygen. 2L or less for comfort.  -TOC referral placed on 08/07/20 for hospice home placement vs SNF with hospice.  -PMT will continue to support and follow.    Time Total: 20 min.   Visit consisted of counseling and education dealing with the complex and emotionally intense issues of symptom management and palliative care in the setting of serious and potentially life-threatening illness.Greater than 50%  of this time was spent counseling and coordinating care related to the above assessment and plan.  Willette Alma, AGPCNP-BC  Palliative Medicine Team [phone]

## 2020-08-08 NOTE — Plan of Care (Signed)
  Problem: Education: Goal: Knowledge of General Education information will improve Description: Including pain rating scale, medication(s)/side effects and non-pharmacologic comfort measures Outcome: Not Progressing   Problem: Health Behavior/Discharge Planning: Goal: Ability to manage health-related needs will improve Outcome: Not Progressing   Problem: Clinical Measurements: Goal: Diagnostic test results will improve Outcome: Not Progressing Goal: Respiratory complications will improve Outcome: Not Progressing   Problem: Coping: Goal: Level of anxiety will decrease Outcome: Not Progressing

## 2020-08-08 NOTE — Progress Notes (Signed)
Nutrition Brief Note  Chart reviewed. Pt now transitioning to comfort care.  No further nutrition interventions warranted at this time.  Please re-consult as needed.   Davonne Baby, MS, RD, LDN RD pager number and weekend/on-call pager number located in Am[facility].    

## 2020-08-08 NOTE — Progress Notes (Signed)
   Subjective:  Overnight, no acute events.  This morning, patient looked uncomfortable and breathing faster. He did not answer to any questions on evaluation.   Objective:  Vital signs in last 24 hours: Vitals:   08/07/20 0746 08/07/20 1230 08/07/20 1951 08/08/20 0000  BP: 95/65 103/67 109/84 100/67  Pulse: (!) 102 (!) 108 (!) 117 (!) 111  Resp: 18 18    Temp: 98.7 F (37.1 C) 97.6 F (36.4 C)  98.1 F (36.7 C)  TempSrc: Oral Oral  Oral  SpO2: 91% 94%  92%  Weight:      Height:      SpO2: 92 % O2 Flow Rate (L/min): 2 L/min  Intake/Output Summary (Last 24 hours) at 08/08/2020 0644 Last data filed at 08/07/2020 1235 Gross per 24 hour  Intake -  Output 800 ml  Net -800 ml   Filed Weights   07/18/20 0313 07/24/20 0331 07/25/20 0300  Weight: 45.3 kg 46.5 kg 46.2 kg   Physical Exam Vitals reviewed.  Constitutional:      General: He is not in acute distress.    Appearance: He is ill-appearing.  Cardiovascular:     Rate and Rhythm: Regular rhythm. Tachycardia present.     Pulses: Normal pulses.     Heart sounds: Normal heart sounds.  Pulmonary:     Effort: Tachypnea present.  Abdominal:     General: Abdomen is flat. Bowel sounds are normal.     Tenderness: There is no abdominal tenderness.  Musculoskeletal:        General: Deformity present. Normal range of motion.     Comments: Significant edema of the dorsal aspect of the left hand.  Skin:    General: Skin is warm and dry.  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    Assessment/Plan:  Principal Problem:   Empyema (HCC) Active Problems:   Pressure ulcer of sacrum   Ogilvie's syndrome   Protein-calorie malnutrition, severe   Refeeding syndrome   Pneumonia of right lung due to infectious organism   Bacteremia   Spastic quadriparesis (HCC)   Contractures involving both knees   Fever   Goals of care,  counseling/discussion  Andres [name] is a 40 year old male with past medical history significant for intellectual disability, fetal alcohol syndrome, autism, seizure disorder and GERD who was admitted on 12/07 following a fall found to have severe malnutrition and dehydration with hospital course complicated by pneumonia with parapneumonic effusion/empyema without ability to obtain source control (on cefepime), goals of care conversations with patient's guardian and palliative care, refeeding syndrome and anemia.  #Comfort care Andres [name] is currently on day 62 of hospitalization. IMTS and palliative care have had extensive discussions over the past several weeks with patient's caregiver, [name], and caregiver's team who have come to the agreement that patient would benefit from comfort measures given the severity of his condition, lack of meaningful recovery and overall poor prognosis. Patient to be transitioned to comfort care on 08/07/20. Palliative care team following, appreciate recommendations. -Appreciate palliative care team recommendations -Transition to comfort care  Diet: Regular Code status: Comfort care, DNR Prior to Admission Living Arrangement: Home Anticipated Discharge Location: Hospice Barriers to Discharge: Placement  Jasmine December, MD Internal Medicine PGY-1 Pager: [phone] After 5pm on weekdays and 1pm on weekends: On Call pager [phone]  08/08/2020, 6:44 AM

## 2020-08-09 DIAGNOSIS — J869 Pyothorax without fistula: Secondary | ICD-10-CM | POA: Diagnosis not present

## 2020-08-09 DIAGNOSIS — J90 Pleural effusion, not elsewhere classified: Secondary | ICD-10-CM | POA: Diagnosis not present

## 2020-08-09 DIAGNOSIS — B9620 Unspecified Escherichia coli [E. coli] as the cause of diseases classified elsewhere: Secondary | ICD-10-CM | POA: Diagnosis not present

## 2020-08-09 DIAGNOSIS — J168 Pneumonia due to other specified infectious organisms: Secondary | ICD-10-CM | POA: Diagnosis not present

## 2020-08-09 MED ORDER — HEPARIN SOD (PORK) LOCK FLUSH 100 UNIT/ML IV SOLN
300.0000 [IU] | Freq: Four times a day (QID) | INTRAVENOUS | Status: DC | PRN
  Administered 2020-08-09: 300 [IU] via INTRAVENOUS
  Filled 2020-08-09 (×2): qty 3

## 2020-08-09 NOTE — TOC Progression Note (Signed)
Transition of Care [facility]) - Progression Note    Patient Details  Name: Andres [name] MRN: [id number] Date of Birth: [birth year]  Transition of Care [facility]) CM/SW Contact  Baldemar Lenis, [place] Phone Number: 08/09/2020, 10:33 AM  Clinical Narrative:   CSW acknowledging consult for hospice placement. CSW contacted [facility] and spoke with Chrislyn, provided referral for residential hospice. CSW discussed how patient may not meet full criteria for residential, might need SNF with hospice, but asked for medical review to determine appropriate placement. No beds available today, anyway. [facility] to review referral and get back with CSW after. CSW to follow.    Expected Discharge Plan: Hospice Medical Facility Barriers to Discharge: Continued Medical Work up,Hospice Bed not available  Expected Discharge Plan and Services Expected Discharge Plan: Hospice Medical Facility   Discharge Planning Services: CM Consult Post Acute Care Choice: Hospice Living arrangements for the past 2 months: Single Family Home                                       Social Determinants of Health (SDOH) Interventions    Readmission Risk Interventions No flowsheet data found.

## 2020-08-09 NOTE — Progress Notes (Signed)
[facility])  Chart and pt information under review by [facility] physician.  Hospice eligibility pending at this time.  [facility] is unable to offer a room today. Hospital Liaison will follow up tomorrow or sooner if a room becomes available. Please do not hesitate to call with questions.    Thank you for the opportunity to participate in this patient's care.  Gillian Scarce, BSN, RN [facility] Liaison (listed on [place] under Hospice/[facility])    [phone] (24h on call)

## 2020-08-09 NOTE — Plan of Care (Signed)
  Problem: Safety: Goal: Ability to remain free from injury will improve Outcome: Progressing   Problem: Clinical Measurements: Goal: Ability to maintain clinical measurements within normal limits will improve Outcome: Progressing Goal: Will remain free from infection Outcome: Progressing Goal: Diagnostic test results will improve Outcome: Progressing Goal: Respiratory complications will improve Outcome: Progressing Goal: Cardiovascular complication will be avoided Outcome: Progressing   

## 2020-08-09 NOTE — Progress Notes (Signed)
   Subjective:   Overnight, patient received two doses of morphine and ondansetron.  This morning, patient unable to provide interval history.   Objective:  Vital signs in last 24 hours: Vitals:   08/08/20 0936 08/08/20 2048 08/09/20 0008 08/09/20 0300  BP: 108/70 101/67 100/62 100/64  Pulse: (!) 129 (!) 115 (!) 110 (!) 110  Resp: (!) 48 (!) 22 20 (!) 21  Temp: 100.1 F (37.8 C) 100.3 F (37.9 C) 99.4 F (37.4 C) 99.3 F (37.4 C)  TempSrc: Axillary Axillary Axillary Axillary  SpO2: 95% 100% 98% 100%  Weight:      Height:      SpO2: 100 % O2 Flow Rate (L/min): 2 L/min  Intake/Output Summary (Last 24 hours) at 08/09/2020 0711 Last data filed at 08/09/2020 0300 Gross per 24 hour  Intake 460 ml  Output 950 ml  Net -490 ml   Physical Exam Vitals reviewed.  Constitutional:      General: He is not in acute distress.    Appearance: He is ill-appearing.  Cardiovascular:     Rate and Rhythm: Regular rhythm. Tachycardia present.     Pulses: Normal pulses.     Heart sounds: Normal heart sounds.  Pulmonary:     Effort: Pulmonary effort is normal. No respiratory distress.  Abdominal:     General: Abdomen is flat.     Palpations: Abdomen is soft.   Labs in past 24 hours: None  Imaging in past 24 hours: No results found.  Assessment/Plan:  Principal Problem:   Empyema (HCC) Active Problems:   Pressure ulcer of sacrum   Ogilvie's syndrome   Protein-calorie malnutrition, severe   Refeeding syndrome   Pneumonia of right lung due to infectious organism   Bacteremia   Spastic quadriparesis (HCC)   Contractures involving both knees   Fever   Goals of care, counseling/discussion  Andres [name] is a 40 year old male with past medical history significant for intellectual disability, fetal alcohol syndrome, autism, seizure disorder and GERD who was admitted on 12/07 following a fall found to have severe malnutrition and dehydration with hospital course complicated by  pneumonia with parapneumonic effusion/empyema without ability to obtain source control (on cefepime), goals of care conversations with patient's guardian and palliative care, refeeding syndrome and anemia.  #Comfort care Andres [name] is currently on day 57 of hospitalization. IMTS and palliative care have had extensive discussions over the past several weeks with patient's guardian, Andres [name], and guardian's team who have come to the agreement that patient would benefit from comfort measures given the severity of his condition, lack of meaningful recovery and overall poor prognosis. Patient is on comfort measures, currently awaiting placement at [facility]. -Palliative care following, appreciate recommendations -Continue comfort measures -Pending hospice  Jasmine December, MD Internal Medicine PGY-1 Pager: [phone] After 5pm on weekdays and 1pm on weekends: On Call pager [phone]  08/09/2020, 7:11 AM

## 2020-08-09 NOTE — Plan of Care (Signed)
No concerns, PRN pain med given, and dsgs changed. Problem: Education: Goal: Knowledge of General Education information will improve Description: Including pain rating scale, medication(s)/side effects and non-pharmacologic comfort measures Outcome: Progressing   Problem: Health Behavior/Discharge Planning: Goal: Ability to manage health-related needs will improve Outcome: Progressing   Problem: Clinical Measurements: Goal: Ability to maintain clinical measurements within normal limits will improve Outcome: Progressing Goal: Will remain free from infection Outcome: Progressing Goal: Diagnostic test results will improve Outcome: Progressing Goal: Respiratory complications will improve Outcome: Progressing Goal: Cardiovascular complication will be avoided Outcome: Progressing   Problem: Activity: Goal: Risk for activity intolerance will decrease Outcome: Progressing   Problem: Nutrition: Goal: Adequate nutrition will be maintained Outcome: Progressing   Problem: Coping: Goal: Level of anxiety will decrease Outcome: Progressing   Problem: Elimination: Goal: Will not experience complications related to bowel motility Outcome: Progressing Goal: Will not experience complications related to urinary retention Outcome: Progressing   Problem: Pain Managment: Goal: General experience of comfort will improve Outcome: Progressing   Problem: Safety: Goal: Ability to remain free from injury will improve Outcome: Progressing   Problem: Skin Integrity: Goal: Risk for impaired skin integrity will decrease Outcome: Progressing   Problem: Safety: Goal: Non-violent Restraint(s) Outcome: Progressing

## 2020-08-10 DIAGNOSIS — J869 Pyothorax without fistula: Secondary | ICD-10-CM | POA: Diagnosis not present

## 2020-08-10 NOTE — Progress Notes (Signed)
   Subjective:   Overnight, patient received glycopyrrolate and morphine.  This morning, patient not responding verbally to questions.  Objective:  Vital signs in last 24 hours: Vitals:   08/09/20 1728 08/09/20 1900 08/10/20 0005 08/10/20 0403  BP: 102/64 109/60 (!) 100/56 (!) 100/58  Pulse: (!) 109 98 98 (!) 102  Resp: 18 19 20 18   Temp: 98.3 F (36.8 C) 100 F (37.8 C) (!) 100.6 F (38.1 C) 100.2 F (37.9 C)  TempSrc: Axillary Axillary Axillary Axillary  SpO2: 100% 96% 99% 96%  Weight:      Height:      SpO2: 96 % O2 Flow Rate (L/min): 2 L/min  Intake/Output Summary (Last 24 hours) at 08/10/2020 0647 Last data filed at 08/10/2020 0100 Gross per 24 hour  Intake 970 ml  Output 800 ml  Net 170 ml   Physical Exam Vitals reviewed.  Constitutional:      General: He is not in acute distress.    Appearance: He is ill-appearing.  Cardiovascular:     Rate and Rhythm: Regular rhythm. Tachycardia present.     Pulses: Normal pulses.     Heart sounds: Normal heart sounds.  Pulmonary:     Effort: Pulmonary effort is normal. No respiratory distress.  Abdominal:     General: Abdomen is flat.     Palpations: Abdomen is soft.   Labs in past 24 hours: None  Imaging in past 24 hours: No results found.  Assessment/Plan:  Principal Problem:   Empyema (HCC) Active Problems:   Pressure ulcer of sacrum   Ogilvie's syndrome   Protein-calorie malnutrition, severe   Refeeding syndrome   Pneumonia of right lung due to infectious organism   Bacteremia   Spastic quadriparesis (HCC)   Contractures involving both knees   Fever   Goals of care, counseling/discussion  Pavan Bring is a 40 year old male with past medical history significant for intellectual disability, fetal alcohol syndrome, autism, seizure disorder and GERD who was admitted on 12/07 following a fall found to have severe malnutrition and dehydration with hospital course complicated by pneumonia with  parapneumonic effusion/empyema without ability to obtain source control (on cefepime), goals of care conversations with patient's guardian and palliative care, refeeding syndrome and anemia.  #Comfort care Mr. Tindol is currently on day 63 of hospitalization. IMTS and palliative care have had extensive discussions over the past several weeks with patient's guardian, 24, and guardian's team who have come to the agreement that patient would benefit from comfort measures given the severity of his condition, lack of meaningful recovery and overall poor prognosis. Patient is on comfort measures, currently awaiting placement at [facility] upon receiving bed availability. -Palliative care following, appreciate recommendations -Continue comfort measures -Pending hospice  [facility], MD Internal Medicine PGY-1 Pager: [phone] After 5pm on weekdays and 1pm on weekends: On Call pager [phone]  08/10/2020, 6:47 AM

## 2020-08-10 NOTE — Progress Notes (Signed)
Daily Progress Note   Patient Name: Andres [name]       Date: 08/10/2020 DOB: 14-Jan-1981  Age: 40 y.o. MRN#: [id number] Attending Physician: Tyson Alias, * Primary Care Physician: Leilani Able, MD Admit Date: 07/01/2020  Reason for Consultation/Follow-up: Establishing goals of care  Subjective: Patient nonverbal. When asked if he is uncomfortable, he shakes his head no.   Length of Stay: 39  Current Medications: Scheduled Meds:  . ARIPiprazole  10 mg Oral Daily  . collagenase   Topical Daily  . lactase  3,000 Units Oral TID WC  . pantoprazole  40 mg Oral BID  . sodium chloride flush  10-40 mL Intracatheter Q12H  . traZODone  100 mg Oral QPM    Continuous Infusions:   PRN Meds: acetaminophen, glycopyrrolate, haloperidol lactate, heparin lock flush, morphine injection, morphine CONCENTRATE **OR** morphine CONCENTRATE, ondansetron (ZOFRAN) IV, polyvinyl alcohol, sodium chloride flush, sodium chloride flush  Physical Exam Constitutional:      General: He is not in acute distress. Pulmonary:     Comments: Slight tachypnea Neurological:     Mental Status: He is alert.     Comments: nonverbal             Vital Signs: BP (!) 100/58 (BP Location: Right Arm)   Pulse (!) 102   Temp 100.2 F (37.9 C) (Axillary)   Resp 18   Ht 5\' 6"  (1.676 m)   Wt 46.2 kg   SpO2 96%   BMI 16.44 kg/m  SpO2: SpO2: 96 % O2 Device: O2 Device: Room Air O2 Flow Rate: O2 Flow Rate (L/min): 2 L/min  Intake/output summary:   Intake/Output Summary (Last 24 hours) at 08/10/2020 0752 Last data filed at 08/10/2020 0100 Gross per 24 hour  Intake 970 ml  Output 800 ml  Net 170 ml   LBM: Last BM Date: 08/09/20 Baseline Weight: Weight: 46.8 kg Most recent weight: Weight: 46.2 kg       Palliative  Assessment/Data: PPS 30%    Flowsheet Rows   Flowsheet Row Most Recent Value  Intake Tab   Referral Department --  [imts]  Unit at Time of Referral Intermediate Care Unit  Palliative Care Primary Diagnosis Neurology  Date Notified 07/23/20  Palliative Care Type New Palliative care  Reason for referral Clarify Goals of Care  Date of Admission 07/11/20  Date first seen by Palliative Care 07/24/20  # of days Palliative referral response time 1 Day(s)  # of days IP prior to Palliative referral 12  Clinical Assessment   Psychosocial & Spiritual Assessment   Palliative Care Outcomes       Patient Active Problem List   Diagnosis Date Noted  . Fever   . Goals of care, counseling/discussion   . Spastic quadriparesis (HCC)   . Contractures involving both knees   . Empyema (HCC)   . Bacteremia   . Pneumonia of right lung due to infectious organism   . Refeeding syndrome   . Protein-calorie malnutrition, severe 07/05/2020  . Ogilvie's syndrome   . Pressure ulcer of sacrum 07/03/2020  . BRBPR (bright red blood per rectum) 10/28/2019  . Hemorrhoids 10/28/2019    Palliative Care Assessment & Plan  HPI: Per intake H&P -->  Mr. 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 Patient's hospital course has been complicated by upper GI bleed s/p EGD with severe grade D erosive esophagitis and angiodysplasias treated with APC, refeeding syndrome, right sided empyema, E. 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  Palliative care was asked to aid in goals of care conversations.  Assessment: Patient appears  mostly comfortable, denies discomfort. Slight tachypnea noted on exam. Discussed plan for symptom management with RN.  Reviewed medications - orders remain appropriate.   Recommendations/Plan:  Continue comfort measures and symptom management with PRN medications  No escalation of care  To hospice home vs LTC with hospice   Goals of Care and Additional Recommendations:  Limitations on Scope of Treatment: Full Comfort Care  Code Status:  DNR   Care plan was discussed with RN  Thank you for allowing the Palliative Medicine Team to assist in the care of this patient.   Total Time 15 minutes Prolonged Time Billed  no       Greater than 50%  of this time was spent counseling and coordinating care related to the above assessment and plan.  Gerlean Ren, DNP, [facility] Palliative Medicine Team Team Phone # [phone]  Pager [phone]

## 2020-08-10 NOTE — Discharge Summary (Addendum)
Name: Andres [name] MRN: [id number] DOB: 1980/08/20 40 y.o. PCP: Leilani Able, MD  Date of Admission: 07/01/2020 10:48 PM Date of Discharge: 08/20/2020 Attending Physician: Anne Shutter, MD  Discharge Diagnosis: 1. Principal Problem:   Empyema (HCC) Active Problems:   Pressure ulcer of sacrum   Ogilvie's syndrome   Protein-calorie malnutrition, severe   Refeeding syndrome   Pneumonia of right lung due to infectious organism   Bacteremia   Spastic quadriparesis (HCC)   Contractures involving both knees   Fever   Goals of care, counseling/discussion   Hospice care patient  Discharge Medications: Allergies as of 08/20/2020      Reactions   Lactose Diarrhea      Medication List    STOP taking these medications   ARIPiprazole 10 MG tablet Commonly known as: ABILIFY   famotidine 20 MG tablet Commonly known as: PEPCID   hydrOXYzine 25 MG tablet Commonly known as: ATARAX/VISTARIL   traZODone 100 MG tablet Commonly known as: DESYREL   Vitamin D-3 25 MCG (1000 UT) Caps     TAKE these medications   acetaminophen 325 MG tablet Commonly known as: TYLENOL Take 2 tablets (650 mg total) by mouth every 6 (six) hours as needed for headache, fever or mild pain.   glycopyrrolate 0.2 MG/ML injection Commonly known as: ROBINUL Inject 1 mL (0.2 mg total) into the vein every 4 (four) hours as needed (excessive secretions).   lactase 3000 units tablet Commonly known as: LACTAID Take 1 tablet (3,000 Units total) by mouth 3 (three) times daily with meals.   morphine 2 MG/ML injection Inject 0.5 mLs (1 mg total) into the vein every 2 (two) hours as needed (or dyspnea).   ondansetron 4 MG/2ML Soln injection Commonly known as: ZOFRAN Inject 2 mLs (4 mg total) into the vein every 6 (six) hours as needed for nausea.   polyvinyl alcohol 1.4 % ophthalmic solution Commonly known as: LIQUIFILM TEARS Place 1 drop into both eyes 4 (four) times daily as needed for dry eyes.             Discharge Care Instructions  (From admission, onward)         Start     Ordered   08/20/20 0000  Discharge wound care:       Comments: Wound care to lesions on bilateral feet/toes:  Cleanse with NS, and pat dry. Paint with betadine swabstick and allow to dry. When dry, place a cotton ball between toes where lesions touch (interdigital). Place feet into Prevalon boots.  Apply Santyl to left and right hip wounds in a nickel thick layer. Cover with a saline moistened gauze, then dry gauze or ABD pad.  Change daily.   08/20/20 1259         Disposition and follow-up:   Mr.Andres [name] was discharged from [facility] in Critical condition to hospice.  Contact information for after-discharge care    Destination    [facility] .   Service: Skilled Nursing Contact information: [address] [place] [address] [phone]                 Hospital Course by problem list: 1. Comfort care: Due to patient's severe infection with inability to obtain source control, poor prognosis and lack of meaningful recovery, patient's guardian and guardian's team desired to change patient from Full Code to DNR then to engage in comfort care only. He was transitioned to Comfort Care on January  12th with plan to discharge to hospice.  2. Pneumonia with severe parapneumonic effusion: Patient developed fever to 102.7 on December 17th with findings on chest x-ray consistent with right-sided pneumonia with loculated pleural effusion. Patient was started on broad spectrum IV antibiotics and cardiothoracic surgery and interventional radiology were consulted. Patient was determined to not be a surgical candidate from cardiothoracic surgery. Interventional radiology attempted pigtail catheter placement on two separate occassions without success and performed thoracentesis. Infectious disease was consulted who recommended continuation of  IV antibiotics. Patient was continued on antibiotics for approximately one month with persistent fevers and inability to obtain adequate source control. Antibiotics were discontinued on January 12th upon patient's guardian electing to pursue comfort care.  3. Severe malnutrition/Refeeding syndrome: Patient presented to [facility] on December 7th following a fall at home. He was noted to be severely malnourished and dehydrated. Extensive discussions with patient's caregiver revealed that patient was not receiving adequate nutrition and hydration at home due to frequent diarrhea and vomiting at home. Upon restarting patient's diet, he had the development of refeeding syndrome. He had daily labs collected and repletion of his electrolytes throughout his hospitalization. He had appropriate oral intake throughout hospitalization and a strong appetite.  4. Right MCA stroke: Patient noted to have flaccid paralysis of the left upper extremity on 12/22. Neurology was consulted and head CT largely unremarkable. Primary concern for right MCA stroke. Patient had no improvement in function of his extremity throughout the remainder of his hospitalization despite working with physical and occupational therapy and PM&R consultation.  5. Upper GI bleed s/p EGD with severe grade D erosive esophagitis and angiodysplasias treated with APC. Patient had upper GI bleed early during hospitalization. GI was consulted who performed EGD which revealed grade D erosive esophagitis and angiodysplasias. He was treated with APC with stabilization of his hemoglobin. He was transfused a total of 5 units of blood with last transfusion on January 5th.  6. Dry gangrene/pressure wounds: Patient had the development of significant dry gangrene of the bilateral toes as well as significant pressure wounds of the hips. Patient received appropriate wound care support by nursing daily.  Discharge Vitals:   BP 98/75 (BP Location: Right Arm) Comment: RN  Notified  Pulse (!) 103 Comment: RN Notified  Temp 98.7 F (37.1 C) (Axillary)   Resp 17   Ht 5\' 6"  (1.676 m)   Wt 46.2 kg   SpO2 98%   BMI 16.44 kg/m   Pertinent Labs, Studies, and Procedures:  CBC Latest Ref Rng & Units 08/07/2020 08/06/2020 08/05/2020  WBC 4.0 - 10.5 K/uL 17.7(H) 22.5(H) 22.8(H)  Hemoglobin 13.0 - 17.0 g/dL 7.9(L) 8.6(L) 8.4(L)  Hematocrit 39.0 - 52.0 % 24.1(L) 26.9(L) 26.1(L)  Platelets 150 - 400 K/uL 306 375 415(H)   CMP Latest Ref Rng & Units 08/07/2020 08/06/2020 08/05/2020  Glucose 70 - 99 mg/dL 10/03/2020) 696(V) 893(Y)  BUN 6 - 20 mg/dL 13 10 11   Creatinine 0.61 - 1.24 mg/dL 101(B) ) 5.10(C)  Sodium 135 - 145 mmol/L 135 135 135  Potassium 3.5 - 5.1 mmol/L 3.9 3.7 3.2(L)  Chloride 98 - 111 mmol/L 100 100 100  CO2 22 - 32 mmol/L 28 28 28   Calcium 8.9 - 10.3 mg/dL 7.4(L) 7.8(L) 7.5(L)  Total Protein 6.5 - 8.1 g/dL 5.1(L) 5.5(L) 5.5(L)  Total Bilirubin 0.3 - 1.2 mg/dL 5.85(I) 0.4 7.78(E)  Alkaline Phos 38 - 126 U/L 119 123 121  AST 15 - 41 U/L 49(H) 41 41  ALT 0 - 44  U/L 41 41 41  DG Chest 1 View  Result Date: 07/30/2020 CLINICAL DATA:  Fever. EXAM: CHEST  1 VIEW COMPARISON:  July 25, 2020. FINDINGS: Stable cardiomediastinal silhouette. No pneumothorax or pleural effusion is noted. Left lung is clear. Left-sided PICC line is unchanged in position. Right basilar opacity is significantly increased concerning for pneumonia. Bony thorax is unremarkable. IMPRESSION: Worsening right basilar pneumonia. Electronically Signed   By: Lupita Raider M.D.   On: 07/30/2020 13:39   DG Chest 1 View  Result Date: 07/25/2020 CLINICAL DATA:  Congestion and shortness of breath. EXAM: CHEST  1 VIEW COMPARISON:  One-view chest x-ray 07/22/2020. One-view chest x-ray 07/18/2020. FINDINGS: Heart is enlarged. Lung volumes are low. Progressive patchy airspace opacity is present both lung bases. Left-sided PICC line terminates in the mid SVC. IMPRESSION: 1. Progressive patchy  airspace disease both lung bases. While this likely reflects atelectasis, infection is not excluded. 2. Cardiomegaly. Electronically Signed   By: Marin [name] M.D.   On: 07/25/2020 09:23   DG Chest Port 1 View  Result Date: 07/22/2020 CLINICAL DATA:  Empyema. EXAM: PORTABLE CHEST 1 VIEW COMPARISON:  July 18, 2020. FINDINGS: Stable cardiomediastinal silhouette. No pneumothorax is noted. Left lung is clear. There appears to be loculated pleural effusion along the right lateral chest wall probable right midlung atelectasis or infiltrate. Exam is limited as patient is rotated to the left. Left-sided PICC line is noted with tip in expected position of cavoatrial junction. Bony thorax is unremarkable. IMPRESSION: Probable loculated pleural effusion along right lateral chest wall. Right midlung atelectasis or infiltrate is noted as well. Exam is limited as patient is rotated to the left. Electronically Signed   By: Lupita Raider M.D.   On: 07/22/2020 08:25   VAS [place] UPPER EXTREMITY VENOUS DUPLEX  Result Date: 08/05/2020 UPPER VENOUS STUDY  Indications: Edema Limitations: Patient positioning. Comparison Study: no prior Performing Technologist: Blanch Media RVS  Examination Guidelines: A complete evaluation includes B-mode imaging, spectral Doppler, color Doppler, and power Doppler as needed of all accessible portions of each vessel. Bilateral testing is considered an integral part of a complete examination. Limited examinations for reoccurring indications may be performed as noted.  Left Findings: +----------+------------+---------+-----------+----------+--------------+ LEFT      CompressiblePhasicitySpontaneousProperties   Summary     +----------+------------+---------+-----------+----------+--------------+ IJV           Full       Yes       Yes                             +----------+------------+---------+-----------+----------+--------------+ Subclavian    Full       Yes       Yes                              +----------+------------+---------+-----------+----------+--------------+ Axillary      Full       Yes       Yes                             +----------+------------+---------+-----------+----------+--------------+ Brachial      Full       Yes       Yes                             +----------+------------+---------+-----------+----------+--------------+  Radial        Full                                                 +----------+------------+---------+-----------+----------+--------------+ Ulnar         Full                                                 +----------+------------+---------+-----------+----------+--------------+ Cephalic                                            Not visualized +----------+------------+---------+-----------+----------+--------------+ Basilic       Full                                                 +----------+------------+---------+-----------+----------+--------------+  Summary:  Left: No evidence of deep vein thrombosis in the upper extremity. No evidence of superficial vein thrombosis in the upper extremity. No evidence of thrombosis in the subclavian.  *See table(s) above for measurements and observations.  Diagnosing physician: Heath Larkhomas Hawken Electronically signed by Heath Larkhomas Hawken on 08/05/2020 at 7:19:49 PM.    Final    Discharge Instructions: Discharge Instructions    Discharge wound care:   Complete by: As directed    Wound care to lesions on bilateral feet/toes:  Cleanse with NS, and pat dry. Paint with betadine swabstick and allow to dry. When dry, place a cotton ball between toes where lesions touch (interdigital). Place feet into Prevalon boots.  Apply Santyl to left and right hip wounds in a nickel thick layer. Cover with a saline moistened gauze, then dry gauze or ABD pad.  Change daily.   Increase activity slowly   Complete by: As directed      Signed: Roylene ReasonJohnson, Archita Lomeli,  MD 08/20/2020, 3:04 PM   Pager: [phone]

## 2020-08-10 NOTE — Progress Notes (Signed)
[facility])  Chart and pt information under review by [facility] physician.  Hospice eligibility pending at this time.  [facility] is unable to offer a room today as there is no bed open at [facility] and eligibility is pending  .Hospital Liaison will follow up tomorrow or sooner if a room becomes available. Please do not hesitate to call with questions.    Thank you for the opportunity to participate in this patient's care.  Wallis Bamberg RN, BSN, CCRN [facility] Liaison

## 2020-08-11 DIAGNOSIS — K219 Gastro-esophageal reflux disease without esophagitis: Secondary | ICD-10-CM | POA: Diagnosis not present

## 2020-08-11 DIAGNOSIS — F840 Autistic disorder: Secondary | ICD-10-CM | POA: Diagnosis not present

## 2020-08-11 DIAGNOSIS — J869 Pyothorax without fistula: Secondary | ICD-10-CM | POA: Diagnosis not present

## 2020-08-11 DIAGNOSIS — R790 Abnormal level of blood mineral: Secondary | ICD-10-CM | POA: Diagnosis not present

## 2020-08-11 NOTE — Progress Notes (Signed)
   Subjective:   Overnight, no acute events.  This morning, patient not able to participate in evaluation and provides no interval history, complaints or concerns.  Objective:  Vital signs in last 24 hours: Vitals:   08/10/20 0947 08/10/20 1229 08/10/20 1707 08/10/20 2021  BP: 119/73 128/73 119/70 122/71  Pulse: (!) 101 96 86 85  Resp: 20 18 20 19   Temp: 98.5 F (36.9 C) 98.7 F (37.1 C) 98.4 F (36.9 C) 98.3 F (36.8 C)  TempSrc: Oral Oral Oral Oral  SpO2: 98% 98% 100% 98%  Weight:      Height:      SpO2: 98 % O2 Flow Rate (L/min): 2 L/min  Intake/Output Summary (Last 24 hours) at 08/11/2020 1027 Last data filed at 08/11/2020 0800 Gross per 24 hour  Intake 940 ml  Output 1500 ml  Net -560 ml   Physical Exam Vitals reviewed.  Constitutional:      General: He is not in acute distress.    Appearance: He is ill-appearing.  Cardiovascular:     Rate and Rhythm: Normal rate and regular rhythm.     Pulses: Normal pulses.     Heart sounds: Normal heart sounds.  Pulmonary:     Effort: Pulmonary effort is normal. No respiratory distress.     Comments: Absent breath sounds in right lung base Abdominal:     General: Abdomen is flat.     Palpations: Abdomen is soft.   Labs in past 24 hours: None  Imaging in past 24 hours: No results found.  Assessment/Plan:  Principal Problem:   Empyema (HCC) Active Problems:   Pressure ulcer of sacrum   Ogilvie's syndrome   Protein-calorie malnutrition, severe   Refeeding syndrome   Pneumonia of right lung due to infectious organism   Bacteremia   Spastic quadriparesis (HCC)   Contractures involving both knees   Fever   Goals of care, counseling/discussion  Andres [name] is a 40 year old male with past medical history significant for intellectual disability, fetal alcohol syndrome, autism, seizure disorder and GERD who was admitted on 12/07 following a fall found to have severe malnutrition and dehydration with hospital  course complicated by pneumonia with parapneumonic effusion/empyema without ability to obtain source control, refeeding syndrome, right MCA stroke and GI bleed.  #Comfort care Patient transitioned to comfort care on 08/07/20. Patient much less interactive over past few days following cessation of IV antibiotics and administration of opioid pain medications for comfort. Patient is currently awaiting bed availability at [facility] versus long-term care hospice services pending hospice eligibility approval. -Palliative care following, appreciate recommendations -Continue comfort measures -Pending hospice  [facility], MD Internal Medicine PGY-1 Pager: [phone] After 5pm on weekdays and 1pm on weekends: On Call pager [phone]  08/11/2020, 10:27 AM

## 2020-08-11 NOTE — TOC Progression Note (Signed)
Transition of Care [facility]) - Progression Note    Patient Details  Name: Andres [name] MRN: [id number] Date of Birth: [birth year]  Transition of Care [facility]) CM/SW Contact  Carley Hammed, [place] Phone Number: 08/11/2020, 10:40 AM  Clinical Narrative:    CSW was advised that [facility] is unable to offer a hospice bed at this time due to pt's prognosis not qualifying him. CSW reached out to [facility] [place], faxed H&P, they will review for placement. SW will follow up with [facility] on Monday.   Expected Discharge Plan: Hospice Medical Facility Barriers to Discharge: Continued Medical Work up,Hospice Bed not available  Expected Discharge Plan and Services Expected Discharge Plan: Hospice Medical Facility   Discharge Planning Services: CM Consult Post Acute Care Choice: Hospice Living arrangements for the past 2 months: Single Family Home                                       Social Determinants of Health (SDOH) Interventions    Readmission Risk Interventions No flowsheet data found.

## 2020-08-11 NOTE — Progress Notes (Addendum)
[facility])  Chart and patient information under review by [facility] physician. Hospice eligibility has been approved, however patient not currently deemed appropriate for [facility] at this time. We will reassess tomorrow.   [facility] is unable to offer a room today as there is no bed open at [facility] and eligibility is pending. It is possible that patient may need LTC with our hospice services if deemed not eligible. TOC is aware.   Thank you for the opportunity to participate in this patient's care.  Yolande Jolly, BSN, [employer] [phone]

## 2020-08-12 LAB — GLUCOSE, CAPILLARY
Glucose-Capillary: 62 mg/dL — ABNORMAL LOW (ref 70–99)
Glucose-Capillary: 96 mg/dL (ref 70–99)
Glucose-Capillary: 86 mg/dL (ref 70–99)

## 2020-08-12 NOTE — Progress Notes (Signed)
Hypoglycemic Event  CBG: 62  Treatment: 4 oz juice/soda, breakfast  Symptoms: None  Follow-up CBG: Time: 0942 CBG Result:96  Possible Reasons for Event: Inadequate meal intake  Comments/MD notified: Treated    Lorene Dy

## 2020-08-12 NOTE — Plan of Care (Signed)
  Problem: Education: Goal: Knowledge of General Education information will improve Description: Including pain rating scale, medication(s)/side effects and non-pharmacologic comfort measures Outcome: Not Progressing   Problem: Health Behavior/Discharge Planning: Goal: Ability to manage health-related needs will improve Outcome: Not Progressing   Problem: Clinical Measurements: Goal: Diagnostic test results will improve Outcome: Not Progressing   Problem: Activity: Goal: Risk for activity intolerance will decrease Outcome: Not Progressing   Problem: Elimination: Goal: Will not experience complications related to urinary retention Outcome: Not Progressing   Problem: Safety: Goal: Ability to remain free from injury will improve Outcome: Not Progressing

## 2020-08-12 NOTE — TOC Progression Note (Signed)
Transition of Care [facility]) - Progression Note    Patient Details  Name: Andres [name] MRN: [id number] Date of Birth: February 07, 1981  Transition of Care [facility]) CM/SW Contact  Baldemar Lenis, [place] Phone Number: 08/12/2020, 11:39 AM  Clinical Narrative:   CSW noting per chart review that patient does not meet criteria for [facility] at this time, and referral sent to [facility] for their residential hospice. CSW contacted [facility] admissions to check on status of referral. Referral has been received, but they have not had a chance to review it at this time. CSW left her name and number for a call back after referral has been reviewed. CSW to follow.    Expected Discharge Plan: Hospice Medical Facility Barriers to Discharge: Continued Medical Work up,Hospice Bed not available  Expected Discharge Plan and Services Expected Discharge Plan: Hospice Medical Facility   Discharge Planning Services: CM Consult Post Acute Care Choice: Hospice Living arrangements for the past 2 months: Single Family Home                                       Social Determinants of Health (SDOH) Interventions    Readmission Risk Interventions No flowsheet data found.

## 2020-08-12 NOTE — Progress Notes (Signed)
RN changing patient position and checking for incontinence. Patient having copious amounts of clear jelly stool mucous like stool. RN changed patient and just paged Internal Medicine team to make aware.

## 2020-08-12 NOTE — Progress Notes (Signed)
   Subjective:   Overnight, no acute events.  This morning, patient denies having any pain, discomfort or other complaints/concerns. He is looking forward to eating breakfast with assistance by his RN.  Objective:  Vital signs in last 24 hours: Vitals:   08/11/20 1056 08/11/20 1841 08/11/20 2108 08/12/20 0900  BP: 117/67 118/65 119/72 102/62  Pulse: 89  100 100  Resp: 18 18 18 18   Temp: 98 F (36.7 C) 98.6 F (37 C) 98.1 F (36.7 C) 98.5 F (36.9 C)  TempSrc: Oral Oral Axillary Axillary  SpO2: 100% 99% 96% 100%  Weight:      Height:      SpO2: 100 % O2 Flow Rate (L/min): 2 L/min  Intake/Output Summary (Last 24 hours) at 08/12/2020 1000 Last data filed at 08/12/2020 0100 Gross per 24 hour  Intake 440 ml  Output 400 ml  Net 40 ml   Physical Exam Vitals reviewed.  Constitutional:      General: He is not in acute distress.    Appearance: He is ill-appearing.  Cardiovascular:     Rate and Rhythm: Normal rate and regular rhythm.     Pulses: Normal pulses.     Heart sounds: Normal heart sounds.  Pulmonary:     Effort: Pulmonary effort is normal. No respiratory distress.     Comments: Absent breath sounds in right lower/middle lung fields.  Labs in past 24 hours: None  Imaging in past 24 hours: No results found.  Assessment/Plan:  Principal Problem:   Empyema (HCC) Active Problems:   Pressure ulcer of sacrum   Ogilvie's syndrome   Protein-calorie malnutrition, severe   Refeeding syndrome   Pneumonia of right lung due to infectious organism   Bacteremia   Spastic quadriparesis (HCC)   Contractures involving both knees   Fever   Goals of care, counseling/discussion  Graeson Nouri is a 40 year old male with past medical history significant for intellectual disability, fetal alcohol syndrome, autism, seizure disorder and GERD who was admitted on 12/07 following a fall found to have severe malnutrition and dehydration with hospital course complicated by  pneumonia with parapneumonic effusion/empyema without ability to obtain source control, refeeding syndrome, upper GI bleed, and 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   #Comfort care Patient transitioned to comfort care on 08/07/20. Patient much less interactive over past few days following cessation of IV antibiotics, however he appears comfortable at this time. Patient is ready for discharge to hospice, however he is awaiting availability at long-term care facility. Social worker has reached out to 10/05/20, currently awaiting review of medical records. -Palliative care following, appreciate recommendations -CSW seeking bed availability, greatly appreciate support -Continue comfort measures (2L oxygen via [place], glycopyrrolate, haloperidol, morphine, zofran)  [employer], MD Internal Medicine PGY-1 Pager: [phone] After 5pm on weekdays and 1pm on weekends: On Call pager [phone]  08/12/2020, 10:00 AM

## 2020-08-12 NOTE — Progress Notes (Signed)
Patient wore splints for forearms for 8 hours today. Patient tolerated well.

## 2020-08-13 NOTE — Progress Notes (Signed)
   Subjective:   Overnight, no acute events.  This morning, patient not responding verbally to questions. Patient does not indicate any new complaints or concerns.  Objective:  Vital signs in last 24 hours: Vitals:   08/11/20 1841 08/11/20 2108 08/12/20 0900 08/12/20 2055  BP: 118/65 119/72 102/62 119/73  Pulse:  100 100 (!) 106  Resp: 18 18 18    Temp: 98.6 F (37 C) 98.1 F (36.7 C) 98.5 F (36.9 C) 98.3 F (36.8 C)  TempSrc: Oral Axillary Axillary Axillary  SpO2: 99% 96% 100% 100%  Weight:      Height:      SpO2: 100 % O2 Flow Rate (L/min): 2 L/min  Intake/Output Summary (Last 24 hours) at 08/13/2020 0629 Last data filed at 08/12/2020 1800 Gross per 24 hour  Intake 840 ml  Output 600 ml  Net 240 ml   Physical Exam Vitals reviewed.  Constitutional:      General: He is not in acute distress.    Appearance: He is ill-appearing.  Cardiovascular:     Rate and Rhythm: Normal rate and regular rhythm.     Pulses: Normal pulses.     Heart sounds: Normal heart sounds.  Pulmonary:     Effort: Pulmonary effort is normal. No respiratory distress.     Comments: Absent breath sounds in right lower/middle lung fields. On 2L oxygen via nasal cannula  Labs in past 24 hours: None  Imaging in past 24 hours: No results found.  Assessment/Plan:  Principal Problem:   Empyema (HCC) Active Problems:   Pressure ulcer of sacrum   Ogilvie's syndrome   Protein-calorie malnutrition, severe   Refeeding syndrome   Pneumonia of right lung due to infectious organism   Bacteremia   Spastic quadriparesis (HCC)   Contractures involving both knees   Fever   Goals of care, counseling/discussion  Andres [name] is a 40 year old male with past medical history significant for intellectual disability, fetal alcohol syndrome, autism, seizure disorder and GERD who was admitted on 12/07 following a fall found to have severe malnutrition and dehydration with hospital course complicated by  pneumonia with parapneumonic effusion/empyema without ability to obtain source control, refeeding syndrome, upper GI bleed, and 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 Patient subsequently transitioned to comfort care on 08/07/20, now pending hospice.  #Comfort care Patient transitioned to comfort care on 08/07/20. Patient much less interactive following cessation of IV antibiotics. Patient appears comfortable with morphine administration PRN. Patient is ready for discharge to hospice, however he is awaiting availability at long-term care facility. Social worker has reached out to 10/05/20, currently awaiting review of medical records. -Palliative care following, appreciate recommendations -CSW seeking bed availability, greatly appreciate support -Continue comfort measures (2L oxygen via [place], glycopyrrolate, haloperidol, morphine, zofran)  [employer], MD Internal Medicine PGY-1 Pager: [phone] After 5pm on weekdays and 1pm on weekends: On Call pager [phone]  08/13/2020, 6:29 AM

## 2020-08-14 DIAGNOSIS — J869 Pyothorax without fistula: Secondary | ICD-10-CM | POA: Diagnosis not present

## 2020-08-14 DIAGNOSIS — I96 Gangrene, not elsewhere classified: Secondary | ICD-10-CM | POA: Diagnosis not present

## 2020-08-14 DIAGNOSIS — M6249 Contracture of muscle, multiple sites: Secondary | ICD-10-CM | POA: Diagnosis not present

## 2020-08-14 DIAGNOSIS — R600 Localized edema: Secondary | ICD-10-CM | POA: Diagnosis not present

## 2020-08-14 NOTE — TOC Progression Note (Signed)
Transition of Care [facility]) - Progression Note    Patient Details  Name: Andres [name] MRN: [id number] Date of Birth: [birth year]  Transition of Care [facility]) CM/SW Contact  Baldemar Lenis, [place] Phone Number: 08/14/2020, 3:58 PM  Clinical Narrative:   CSW contacted [facility] to ask about referral sent. They are still reviewing referral, medical director was asking for clinical updates. They also have no beds available at this time. CSW to follow.    Expected Discharge Plan: Hospice Medical Facility Barriers to Discharge: Continued Medical Work up,Hospice Bed not available  Expected Discharge Plan and Services Expected Discharge Plan: Hospice Medical Facility   Discharge Planning Services: CM Consult Post Acute Care Choice: Hospice Living arrangements for the past 2 months: Single Family Home                                       Social Determinants of Health (SDOH) Interventions    Readmission Risk Interventions No flowsheet data found.

## 2020-08-14 NOTE — Progress Notes (Signed)
Subjective:   Overnight, no acute events.  This morning, patient does not participate in interview but responds quietly with "eat" when asked if he is hungry. He does not provide further interval history.  Objective:  Vital signs in last 24 hours: Vitals:   08/13/20 2034 08/13/20 2353 08/14/20 0414 08/14/20 0742  BP: 112/67 94/69 (!) 88/59 (!) 96/59  Pulse: 97 97 91 95  Resp: 19 19 17 16   Temp: 98.9 F (37.2 C) 98.2 F (36.8 C) 98 F (36.7 C) 97.7 F (36.5 C)  TempSrc: Oral Oral Oral Oral  SpO2: 100% 100% 100% 100%  Weight:      Height:      SpO2: 100 % O2 Flow Rate (L/min): 2 L/min  Intake/Output Summary (Last 24 hours) at 08/14/2020 1154 Last data filed at 08/14/2020 0914 Gross per 24 hour  Intake 10 ml  Output 1400 ml  Net -1390 ml   Physical Exam Vitals reviewed.  Constitutional:      General: He is not in acute distress.    Appearance: He is ill-appearing.  Cardiovascular:     Rate and Rhythm: Regular rhythm. Tachycardia present.     Pulses: Normal pulses.     Heart sounds: Normal heart sounds.  Pulmonary:     Effort: Pulmonary effort is normal. No respiratory distress.     Comments: Absent breath sounds in right lower/middle lung fields. On 2L oxygen via nasal cannula Abdominal:     General: Abdomen is flat. There is no distension.     Palpations: Abdomen is soft.     Tenderness: There is no abdominal tenderness.  Musculoskeletal:     Comments: Contractures of the bilateral lower extremities. Significant edema of the dorsal aspects of the bilateral hands and feet.  Skin:    Comments: (See media tab) Right foot: Black, dry, malodorous necrotic patches with hemorrhagic bullae of the dorsal aspect of the first three digits. Black macule of the postero-medial aspect of the foot; Left foot: Black, dry, malodorous necrotic ulceration of the distal aspect of the first digit. Black necrotic macules overlying the PIP and DIP of the dorsal aspects of digits 2-3;  Right knee: Partial thickness ulceration of the extensor surface.    Labs in past 24 hours: None  Imaging in past 24 hours: No results found.  Assessment/Plan:  Principal Problem:   Empyema (HCC) Active Problems:   Pressure ulcer of sacrum   Ogilvie's syndrome   Protein-calorie malnutrition, severe   Refeeding syndrome   Pneumonia of right lung due to infectious organism   Bacteremia   Spastic quadriparesis (HCC)   Contractures involving both knees   Fever   Goals of care, counseling/discussion  Andres [name] is a 40 year old male with past medical history significant for intellectual disability, fetal alcohol syndrome, autism, seizure disorder and GERD who was admitted on 12/07 following a fall found to have severe malnutrition and dehydration with hospital course complicated by pneumonia with parapneumonic effusion/empyema without ability to obtain source control, refeeding syndrome, upper GI bleed, and 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 Patient subsequently transitioned to comfort care on 08/07/20, now pending hospice.  #Comfort care Patient transitioned to comfort care on 08/07/20. Patient appears comfortable on our examination and throughout the day yesterday per nursing documentation. Patient is ready for discharge to hospice, however he is awaiting availability at long-term care facility. Social worker has reached out to 10/05/20 of [place], currently awaiting review of medical records. -Palliative care following, appreciate  recommendations -CSW seeking bed availability, greatly appreciate support -Continue comfort measures (2L oxygen via [place], glycopyrrolate, haloperidol, morphine, zofran)  #Dry gangrene, active Patient noted to have progression of his digital ischemia, now with dry gangrene of the toes of the bilateral feet. He does not appear to have pain with manipulation of the feet. He would benefit from continued supportive care  by nursing as well as consideration of topical metronidazole for odor if continues to progress. -Continue current plan of care  #Empyema of the right lung, active Patient continues to exhibit absent breath sounds throughout the right lower to middle lung fields. He has not had fever in past several days. -Continue 1-2L oxygen via nasal cannula as needed  Jasmine December, MD Internal Medicine PGY-1 Pager: [phone] After 5pm on weekdays and 1pm on weekends: On Call pager [phone]  08/14/2020, 11:54 AM

## 2020-08-14 NOTE — Progress Notes (Signed)
Dressing changed on patients back with xeroform/foam. Right hip dressing changed with santyl/wet gauze/ foam, moderate amount of brown/yellow/odorous drainage noted coming from wound. Areas between toes cleaned and swabbed, cotton applied.

## 2020-08-15 DIAGNOSIS — M6249 Contracture of muscle, multiple sites: Secondary | ICD-10-CM | POA: Diagnosis not present

## 2020-08-15 DIAGNOSIS — I96 Gangrene, not elsewhere classified: Secondary | ICD-10-CM | POA: Diagnosis not present

## 2020-08-15 DIAGNOSIS — R600 Localized edema: Secondary | ICD-10-CM | POA: Diagnosis not present

## 2020-08-15 DIAGNOSIS — J869 Pyothorax without fistula: Secondary | ICD-10-CM | POA: Diagnosis not present

## 2020-08-15 NOTE — TOC Progression Note (Signed)
Transition of Care [facility]) - Progression Note    Patient Details  Name: Andres [name] MRN: [id number] Date of Birth: 28-Oct-1980  Transition of Care [facility]) CM/SW Contact  [name], RN Phone Number: 08/15/2020, 12:16 PM  Clinical Narrative:    No beds available at hosp[facility] of [place]. TOC following.   Expected Discharge Plan: Hospice Medical Facility Barriers to Discharge: Continued Medical Work up,Hospice Bed not available  Expected Discharge Plan and Services Expected Discharge Plan: Hospice Medical Facility   Discharge Planning Services: CM Consult Post Acute Care Choice: Hospice Living arrangements for the past 2 months: Single Family Home                                       Social Determinants of Health (SDOH) Interventions    Readmission Risk Interventions No flowsheet data found.

## 2020-08-15 NOTE — Progress Notes (Signed)
Subjective:   Overnight, no acute events.  This morning, patient interviewed at bedside. He denies pain this afternoon. He replies with eat when asked if he is hungry and thirsty.   Objective:  Vital signs in last 24 hours: Vitals:   08/14/20 0742 08/14/20 2000 08/15/20 0819 08/15/20 1303  BP: (!) 96/59 109/78 102/73 114/75  Pulse: 95 95 88 90  Resp: 16 17 18    Temp: 97.7 F (36.5 C) 99.4 F (37.4 C) 98.9 F (37.2 C)   TempSrc: Oral Oral    SpO2: 100% 96% 100% 100%  Weight:      Height:      SpO2: 100 % O2 Flow Rate (L/min): 2 L/min  Intake/Output Summary (Last 24 hours) at 08/15/2020 1656 Last data filed at 08/15/2020 1350 Gross per 24 hour  Intake 760 ml  Output 1350 ml  Net -590 ml   Physical Exam Vitals reviewed.  Constitutional:      General: He is not in acute distress.    Appearance: He is ill-appearing.  Cardiovascular:     Pulses: Normal pulses.     Heart sounds: Normal heart sounds.  Pulmonary:     Effort: Pulmonary effort is normal. No respiratory distress.     Comments: Absent breath sounds in right lower/middle lung fields. On 2L oxygen via nasal cannula Musculoskeletal:     Comments: Contractures of the bilateral lower extremities. Significant edema of the dorsal aspects of the bilateral hands and feet.  Skin:    Comments: (See media tab for images from 08/14/20)    Labs in past 24 hours: None  Imaging in past 24 hours: No results found.  Assessment/Plan:  Principal Problem:   Empyema (HCC) Active Problems:   Pressure ulcer of sacrum   Ogilvie's syndrome   Protein-calorie malnutrition, severe   Refeeding syndrome   Pneumonia of right lung due to infectious organism   Bacteremia   Spastic quadriparesis (HCC)   Contractures involving both knees   Fever   Goals of care, counseling/discussion  Andres [name] is a 40 year old male with past medical history significant for intellectual disability, fetal alcohol syndrome, autism,  seizure disorder and GERD who was admitted on 12/07 following a fall found to have severe malnutrition and dehydration with hospital course complicated by pneumonia with parapneumonic effusion/empyema without ability to obtain source control, refeeding syndrome, upper GI bleed, and 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 Patient subsequently transitioned to comfort care on 08/07/20, now pending hospice.  #Comfort care Patient transitioned to comfort care on 08/07/20. Patient continues to appear comfortable on our examination. Patient is ready for discharge to hospice, however he is awaiting availability at long-term care facility. Social worker has reached out to 10/05/20, currently awaiting review of medical records. -Palliative care following, appreciate recommendations -CSW seeking bed availability, greatly appreciate support -Continue comfort measures (2L oxygen via [place], glycopyrrolate, haloperidol, morphine, zofran)  #Dry gangrene, active Patient noted to have progression of his digital ischemia, now with dry gangrene of the toes of the bilateral feet. He denies pain this afternoon and does not appear to have pain with manipulation of the extremities. He would benefit from continued supportive care by nursing. -Continue current plan of care  #Empyema of the right lung, active Patient continues to exhibit absent breath sounds throughout the right lower to middle lung fields. He has not had fever in past several days. -Continue 1-2L oxygen via nasal cannula as needed  [employer], MD  Internal Medicine PGY-1 Pager: [phone] After 5pm on weekdays and 1pm on weekends: On Call pager [phone]  08/15/2020, 4:56 PM

## 2020-08-16 NOTE — Progress Notes (Signed)
   Subjective:   Overnight, no acute events.  This morning, patient sleeping comfortably.  Objective:  Vital signs in last 24 hours: Vitals:   08/14/20 2000 08/15/20 0819 08/15/20 1303 08/15/20 2021  BP: 109/78 102/73 114/75 118/77  Pulse: 95 88 90 93  Resp: 17 18  18   Temp: 99.4 F (37.4 C) 98.9 F (37.2 C)  98.7 F (37.1 C)  TempSrc: Oral   Axillary  SpO2: 96% 100% 100% 100%  Weight:      Height:      On room air this morning  Intake/Output Summary (Last 24 hours) at 08/16/2020 08/18/2020 Last data filed at 08/15/2020 2347 Gross per 24 hour  Intake 640 ml  Output 1350 ml  Net -710 ml   Physical Exam Vitals reviewed.  Constitutional:      General: He is not in acute distress.    Appearance: He is ill-appearing.  Cardiovascular:     Pulses: Normal pulses.     Heart sounds: Normal heart sounds.  Pulmonary:     Effort: Pulmonary effort is normal. No respiratory distress.     Comments: Absent breath sounds in right lower/middle lung fields. Musculoskeletal:     Comments: Contractures of the bilateral lower extremities. Significant edema of the dorsal aspects of the bilateral hands and feet.  Skin:    Comments: (See media tab for images from 08/14/20)    Labs in past 24 hours: None  Imaging in past 24 hours: No results found.  Assessment/Plan:  Principal Problem:   Empyema (HCC) Active Problems:   Pressure ulcer of sacrum   Ogilvie's syndrome   Protein-calorie malnutrition, severe   Refeeding syndrome   Pneumonia of right lung due to infectious organism   Bacteremia   Spastic quadriparesis (HCC)   Contractures involving both knees   Fever   Goals of care, counseling/discussion  Andres [name] is a 40 year old male with past medical history significant for intellectual disability, fetal alcohol syndrome, autism, seizure disorder and GERD who was admitted on 12/07 following a fall found to have severe malnutrition and dehydration with hospital course  complicated by pneumonia with parapneumonic effusion/empyema without ability to obtain source control, refeeding syndrome, upper GI bleed, and 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 Patient subsequently transitioned to comfort care on 08/07/20, now pending hospice placement.  #Comfort care Patient transitioned to comfort care on 08/07/20. Patient continues to appear comfortable on our examination. Patient is ready for discharge to hospice, however he is awaiting availability at long-term care facility. Social worker has reached out to 10/05/20, however there are no beds available. -Palliative care following, appreciate recommendations -CSW seeking bed availability, greatly appreciate support -Continue comfort measures (2L oxygen via [place], glycopyrrolate, haloperidol, morphine, zofran)  #Dry gangrene, active Patient noted to have progression of his digital ischemia, now with dry gangrene of the toes of the bilateral feet. He would benefit from continued supportive care by nursing. -Continue current plan of care  [employer], MD Internal Medicine PGY-1 Pager: [phone] After 5pm on weekdays and 1pm on weekends: On Call pager [phone]  08/16/2020, 6:37 AM

## 2020-08-16 NOTE — Progress Notes (Signed)
Dressing changed on patients back with xeroform/foam. Right hip dressing changed with santyl/wet gauze/ foam, moderate amount of brown/yellow/odorous drainage noted coming from wound. Old cotton removed from between toes, areas between toes cleaned,  Swabbed and clean  cotton applied.

## 2020-08-17 DIAGNOSIS — J869 Pyothorax without fistula: Secondary | ICD-10-CM | POA: Diagnosis not present

## 2020-08-17 DIAGNOSIS — R600 Localized edema: Secondary | ICD-10-CM | POA: Diagnosis not present

## 2020-08-17 DIAGNOSIS — M6249 Contracture of muscle, multiple sites: Secondary | ICD-10-CM | POA: Diagnosis not present

## 2020-08-17 DIAGNOSIS — I96 Gangrene, not elsewhere classified: Secondary | ICD-10-CM | POA: Diagnosis not present

## 2020-08-17 NOTE — Progress Notes (Signed)
   Subjective:   Patient replies good morning. Says "good" when asked how he is. More tachypnic today.   Objective:  Vital signs in last 24 hours: Vitals:   08/16/20 0743 08/16/20 1527 08/16/20 1950 08/16/20 2314  BP: (!) 104/58 112/68 124/68 136/76  Pulse: (!) 112 (!) 107 71 89  Resp: 18 18 20 16   Temp: 98.3 F (36.8 C) 98.2 F (36.8 C) 98.2 F (36.8 C) 98.1 F (36.7 C)  TempSrc: Oral Oral Oral Axillary  SpO2: 100% 99% 99% 98%  Weight:      Height:       Constitution: NAD, chronically ill appearing  Cardio: RRR, no m/r/g, no LE edema  Respiratory: tachypnic, decreased breath sounds on right Abdominal: NTTP, soft, non-distended MSK: contractures of b/l LE and hand of RUE Neuro: answers some questions, tracks to voice Skin: multiple pressure wound over the lower extremities with dry and possible wet gangrene. Photos in chart   Assessment/Plan:  Principal Problem:   Empyema (HCC) Active Problems:   Pressure ulcer of sacrum   Ogilvie's syndrome   Protein-calorie malnutrition, severe   Refeeding syndrome   Pneumonia of right lung due to infectious organism   Bacteremia   Spastic quadriparesis (HCC)   Contractures involving both knees   Fever   Goals of care, counseling/discussion  Drae Mitzel is a 40 year old male with past medical history significant for intellectual disability, fetal alcohol syndrome, autism, seizure disorder and GERD who was admitted on 12/07 following a fall found to have severe malnutrition and dehydration with hospital course complicated by pneumonia with parapneumonic effusion/empyema without ability to obtain source control, refeeding syndrome, upper GI bleed, and 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 Patient subsequently transitioned to comfort care on 08/07/20, now pending hospice placement.  #Comfort care Patient transitioned to comfort care on 08/07/20. More tachypnic today but endorses he is doing ok.  Patient is ready for discharge to hospice, however he is awaiting availability at long-term care facility. Social worker has reached out to 10/05/20, however there are no beds available. -Palliative care following, appreciate recommendations -CSW seeking bed availability, greatly appreciate support -Continue comfort measures (2L oxygen via [place], glycopyrrolate, haloperidol, morphine, zofran) - discussed with nursing that he will need additional prns today with increased work of breathing   #Dry gangrene, active Patient noted to have progression of his digital ischemia, now with dry gangrene of the toes of the bilateral feet. He would benefit from continued supportive care by nursing. -Continue current plan of care  VTE: none IVF: none Diet: regular Code: comfort care  Dispo: Anticipated discharge pending hospice placement   Sierra Spargo A, DO 08/17/2020, 7:15 AM Pager: 08/19/2020 After 5pm on weekdays and 1pm on weekends: On Call Pager: [phone]

## 2020-08-18 MED ORDER — ALTEPLASE 2 MG IJ SOLR
2.0000 mg | Freq: Once | INTRAMUSCULAR | Status: AC
  Administered 2020-08-18: 2 mg

## 2020-08-18 NOTE — Progress Notes (Signed)
Subjective:   Overnight, no acute events.  This morning, patient responds with "good" when ask how he is feeling and shakes his head side to side when asked if he is in any pain. He is enjoying his breakfast with support from nursing. Staff reports that patient has been doing well today and continues to have a strong appetite. No new obvious complaints or concerns from patient.  Objective:  Vital signs in last 24 hours: Vitals:   08/17/20 0838 08/17/20 1133 08/17/20 1544 08/17/20 2035  BP: 125/65 90/66 102/69 106/73  Pulse: (!) 55 99 100 87  Resp: (!) 46 (!) 28 (!) 22 18  Temp: 98.2 F (36.8 C) 98.5 F (36.9 C) 98.2 F (36.8 C) 97.8 F (36.6 C)  TempSrc: Oral Oral Oral Oral  SpO2: 92% 100% 94% 93%  Weight:      Height:        Intake/Output Summary (Last 24 hours) at 08/18/2020 0659 Last data filed at 08/18/2020 0336 Gross per 24 hour  Intake 170 ml  Output 200 ml  Net -30 ml   Filed Weights   07/18/20 0313 07/24/20 0331 07/25/20 0300  Weight: 45.3 kg 46.5 kg 46.2 kg  Physical Exam Vitals and nursing note reviewed.  Constitutional:      General: He is not in acute distress.    Appearance: He is ill-appearing.  HENT:     Mouth/Throat:     Mouth: Mucous membranes are moist.     Pharynx: Oropharynx is clear.  Cardiovascular:     Rate and Rhythm: Normal rate and regular rhythm.     Pulses: Normal pulses.     Heart sounds: Normal heart sounds.  Pulmonary:     Effort: Pulmonary effort is normal. No respiratory distress.     Comments: Absent breath sounds throughout right lower/mid lung fields Musculoskeletal:     Comments: Contracted bilateral lower extremities and right upper extremity. 2+ pitting edema of dorsal aspects of hands and feet.  Skin:    Comments: (See media tab) Dry gangrene of digits of bilateral feet.  Neurological:     Mental Status: He is alert. Mental status is at baseline.     Comments: Flaccid paralysis of left upper extremity    Labs in  last 24 hours: None  Imaging in last 24 hours: No results found.  Assessment/Plan:  Principal Problem:   Empyema (HCC) Active Problems:   Pressure ulcer of sacrum   Ogilvie's syndrome   Protein-calorie malnutrition, severe   Refeeding syndrome   Pneumonia of right lung due to infectious organism   Bacteremia   Spastic quadriparesis (HCC)   Contractures involving both knees   Fever   Goals of care, counseling/discussion  Donielle Radziewicz is a 40 year old male with past medical history significant for intellectual disability, fetal alcohol syndrome, autism, seizure disorder and GERD who was admitted on 12/07 following a fall found to have severe malnutrition and dehydration with hospital course complicated by pneumonia with parapneumonic effusion/empyema without ability to obtain source control, refeeding syndrome, upper GI bleed, and 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 Patient subsequently transitioned to comfort care on 08/07/20, now pending hospice placement.  #Comfort care Patient transitioned to comfort care on 08/07/20. Patient is ready for discharge to hospice, however he is awaiting availability at long-term care facility. Social worker has reached out to [facility], however there are no beds available. -Palliative care following, appreciate recommendations -CSW seeking bed availability, greatly appreciate support -Continue  comfort measures (2L oxygen via [place], glycopyrrolate, haloperidol, morphine, zofran)  #Dry gangrene, active Patient noted to have progression of his digital ischemia, now with dry gangrene of the toes of the bilateral feet. He would benefit from continued supportive care by nursing. -Continue current plan of care  #VTE ppx: none #IVF: none #Diet: regular #Code status: comfort care #Dispo: Anticipated discharge pending hospice placement   Roylene Reason, MD 08/18/2020, 6:58 AM Pager: [phone] After 5pm  on weekdays and 1pm on weekends: On Call Pager: [phone]

## 2020-08-19 DIAGNOSIS — R600 Localized edema: Secondary | ICD-10-CM | POA: Diagnosis not present

## 2020-08-19 DIAGNOSIS — J869 Pyothorax without fistula: Secondary | ICD-10-CM | POA: Diagnosis not present

## 2020-08-19 DIAGNOSIS — I96 Gangrene, not elsewhere classified: Secondary | ICD-10-CM | POA: Diagnosis not present

## 2020-08-19 DIAGNOSIS — M6249 Contracture of muscle, multiple sites: Secondary | ICD-10-CM | POA: Diagnosis not present

## 2020-08-19 NOTE — Progress Notes (Signed)
Subjective:   Overnight, no acute events.  This morning, patient resting comfortably in bed, not responsive to quiet verbal stimulation.  Objective:  Vital signs in last 24 hours: Vitals:   08/18/20 0944 08/18/20 1659 08/18/20 2004 08/19/20 0921  BP: 106/69 102/76 105/85 101/68  Pulse: 76 83 98 (!) 105  Resp: 17 16 16  (!) 33  Temp: 98 F (36.7 C) 98.2 F (36.8 C) 99.2 F (37.3 C) 99.8 F (37.7 C)  TempSrc: Oral Axillary Oral Axillary  SpO2: 98% 100% 94% 95%  Weight:      Height:        Intake/Output Summary (Last 24 hours) at 08/19/2020 1323 Last data filed at 08/18/2020 1815 Gross per 24 hour  Intake 120 ml  Output 1500 ml  Net -1380 ml   Filed Weights   07/18/20 0313 07/24/20 0331 07/25/20 0300  Weight: 45.3 kg 46.5 kg 46.2 kg  Physical Exam Vitals and nursing note reviewed.  Constitutional:      General: He is not in acute distress.    Appearance: He is ill-appearing.  HENT:     Mouth/Throat:     Mouth: Mucous membranes are moist.     Pharynx: Oropharynx is clear.  Cardiovascular:     Rate and Rhythm: Normal rate and regular rhythm.     Pulses: Normal pulses.     Heart sounds: Normal heart sounds.  Pulmonary:     Comments: Tachypneic. Absent breath sounds throughout right lower/mid lung fields Musculoskeletal:     Comments: Contracted bilateral lower extremities and right upper extremity. 2+ pitting edema of dorsal aspects of hands and feet.  Skin:    Comments: (See media tab) Dry gangrene of digits of bilateral feet. Developing hyperpigmentation of digits of bilateral hands  Neurological:     Mental Status: He is alert. Mental status is at baseline.     Comments: Flaccid paralysis of left upper extremity   Labs in last 24 hours: None  Imaging in last 24 hours: No results found.  Assessment/Plan:  Principal Problem:   Empyema (HCC) Active Problems:   Pressure ulcer of sacrum   Ogilvie's syndrome   Protein-calorie malnutrition, severe    Refeeding syndrome   Pneumonia of right lung due to infectious organism   Bacteremia   Spastic quadriparesis (HCC)   Contractures involving both knees   Fever   Goals of care, counseling/discussion  Carry Ortez is a 40 year old male with past medical history significant for intellectual disability, fetal alcohol syndrome, autism, seizure disorder and GERD who was admitted on 12/07 following a fall found to have severe malnutrition and dehydration with hospital course complicated by pneumonia with parapneumonic effusion/empyema without ability to obtain source control, refeeding syndrome, upper GI bleed, and 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 Patient subsequently transitioned to comfort care on 08/07/20, now pending hospice placement.  #Comfort care Patient transitioned to comfort care on 08/07/20. Patient is ready for discharge to hospice, however he is awaiting bed availability at long-term care facility. Social worker has reached out to 10/05/20, however there are no beds available. -Palliative care following, appreciate recommendations -CSW seeking bed availability, greatly appreciate support -Continue comfort measures (2L oxygen via [place], glycopyrrolate, haloperidol, morphine, zofran)  #Dry gangrene, active Patient noted to have progression of his digital ischemia, now with dry gangrene of the toes of the bilateral feet. He would benefit from continued supportive care by nursing. -Continue current plan of care  #VTE ppx: None #IVF:  None #Diet: Regular #Code status: Comfort care #Dispo: Anticipated discharge pending hospice placement  Roylene Reason, MD 08/19/2020, 1:23 PM Pager: [phone] After 5pm on weekdays and 1pm on weekends: On Call Pager: [phone]

## 2020-08-20 DIAGNOSIS — J168 Pneumonia due to other specified infectious organisms: Secondary | ICD-10-CM | POA: Diagnosis not present

## 2020-08-20 DIAGNOSIS — I63511 Cerebral infarction due to unspecified occlusion or stenosis of right middle cerebral artery: Secondary | ICD-10-CM

## 2020-08-20 DIAGNOSIS — I96 Gangrene, not elsewhere classified: Secondary | ICD-10-CM

## 2020-08-20 DIAGNOSIS — J869 Pyothorax without fistula: Secondary | ICD-10-CM | POA: Diagnosis not present

## 2020-08-20 DIAGNOSIS — J90 Pleural effusion, not elsewhere classified: Secondary | ICD-10-CM | POA: Diagnosis not present

## 2020-08-20 DIAGNOSIS — R7881 Bacteremia: Secondary | ICD-10-CM | POA: Diagnosis not present

## 2020-08-20 DIAGNOSIS — Z515 Encounter for palliative care: Secondary | ICD-10-CM

## 2020-08-20 LAB — SARS CORONAVIRUS 2 (TAT 6-24 HRS): SARS Coronavirus 2: NEGATIVE

## 2020-08-20 MED ORDER — MORPHINE SULFATE (CONCENTRATE) 10 MG/0.5ML PO SOLN: 5.0000 mg | ORAL | 0 refills | Status: DC | PRN

## 2020-08-20 MED ORDER — MORPHINE SULFATE (PF) 2 MG/ML IV SOLN: 1.0000 mg | INTRAVENOUS | 0 refills | Status: DC | PRN

## 2020-08-20 MED ORDER — GLYCOPYRROLATE 0.2 MG/ML IJ SOLN: 0.2000 mg | INTRAMUSCULAR | Status: AC | PRN

## 2020-08-20 MED ORDER — POLYVINYL ALCOHOL 1.4 % OP SOLN
1.0000 [drp] | Freq: Four times a day (QID) | OPHTHALMIC | 0 refills | Status: AC | PRN
Start: 2020-08-20 — End: ?

## 2020-08-20 MED ORDER — LACTASE 3000 UNITS PO TABS: 3000.0000 [IU] | ORAL_TABLET | Freq: Three times a day (TID) | ORAL | Status: AC

## 2020-08-20 MED ORDER — ONDANSETRON HCL 4 MG/2ML IJ SOLN
4.0000 mg | Freq: Four times a day (QID) | INTRAMUSCULAR | 0 refills | Status: AC | PRN
Start: 2020-08-20 — End: ?

## 2020-08-20 MED ORDER — MORPHINE SULFATE (PF) 2 MG/ML IV SOLN: 1.0000 mg | INTRAVENOUS | 0 refills | Status: AC | PRN

## 2020-08-20 MED ORDER — HALOPERIDOL LACTATE 5 MG/ML IJ SOLN: 0.5000 mg | INTRAMUSCULAR | Status: DC | PRN

## 2020-08-20 MED ORDER — ACETAMINOPHEN 325 MG PO TABS: 650.0000 mg | ORAL_TABLET | Freq: Four times a day (QID) | ORAL | Status: AC | PRN

## 2020-08-20 NOTE — NC FL2 (Signed)
[place] MEDICAID FL2 LEVEL OF CARE SCREENING TOOL     IDENTIFICATION  Patient Name: Andres [name] Birthdate: 06-Nov-1980 Sex: male Admission Date (Current Location): 07/01/2020  [facility] and [place] Number:  Producer, television/film/video and Address:  [facility], [address] [place], [place] [address]      Provider Number: [id number]  Attending Physician Name and Address:  Anne Shutter, MD  Relative Name and Phone Number:       Current Level of Care: Hospital Recommended Level of Care: Skilled Nursing Facility Prior Approval Number:    Date Approved/Denied:   PASRR Number:    Discharge Plan: SNF    Current Diagnoses: Patient Active Problem List   Diagnosis Date Noted  . Hospice care patient 08/20/2020  . Fever   . Goals of care, counseling/discussion   . Spastic quadriparesis (HCC)   . Contractures involving both knees   . Empyema (HCC)   . Bacteremia   . Pneumonia of right lung due to infectious organism   . Refeeding syndrome   . Protein-calorie malnutrition, severe 07/05/2020  . Ogilvie's syndrome   . Pressure ulcer of sacrum 07/03/2020  . BRBPR (bright red blood per rectum) 10/28/2019  . Hemorrhoids 10/28/2019    Orientation RESPIRATION BLADDER Height & Weight     Self  Normal Incontinent Weight: 101 lb 13.6 oz (46.2 kg) Height:  5\' 6"  (167.6 cm)  BEHAVIORAL SYMPTOMS/MOOD NEUROLOGICAL BOWEL NUTRITION STATUS      Incontinent Diet (Regular with thin liquids)  AMBULATORY STATUS COMMUNICATION OF NEEDS Skin   Extensive Assist Non-Verbally  (stage 2 to lt lateral back and rt hip with foam dressings/ cracking to sacrum with foam dressing)                       Personal Care Assistance Level of Assistance  Bathing,Feeding,Dressing Bathing Assistance: Maximum assistance Feeding assistance: Maximum assistance Dressing Assistance: Maximum assistance     Functional Limitations Info  Sight,Hearing,Speech Sight Info:  Adequate Hearing Info: Adequate Speech Info: Impaired (non verbal except for small yes and no answers)    SPECIAL CARE FACTORS FREQUENCY                   Contractures Contractures Info: Present (feet and potentially legs--TBD)    Additional Factors Info  Code Status, Allergies Code Status Info: Full Allergies Info: Lactose          Current Medications (08/20/2020):  This is the current hospital active medication list Current Facility-Administered Medications  Medication Dose Route Frequency Provider Last Rate Last Admin  . acetaminophen (TYLENOL) tablet 650 mg  650 mg Oral Q6H PRN 08/22/2020, MD   650 mg at 08/07/20 0205  . collagenase (SANTYL) ointment   Topical Daily 10/05/20, MD   Given at 08/20/20 1025  . glycopyrrolate (ROBINUL) injection 0.2 mg  0.2 mg Intravenous Q4H PRN Pickenpack-Cousar, Athena N, NP   0.2 mg at 08/19/20 2139  . haloperidol lactate (HALDOL) injection 0.5 mg  0.5 mg Intravenous Q4H PRN Pickenpack-Cousar, Athena N, NP      . heparin lock flush 100 unit/mL  300 Units Intravenous Q6H PRN 2140, MD   300 Units at 08/09/20 1828  . lactase (LACTAID) tablet 3,000 Units  3,000 Units Oral TID WC 08/11/20, MD   3,000 Units at 08/20/20 1259  . morphine 2 MG/ML injection 1 mg  1 mg Intravenous Q2H PRN Pickenpack-Cousar, 08/22/20,  NP   1 mg at 08/18/20 2207  . morphine CONCENTRATE 10 MG/0.5ML oral solution 5 mg  5 mg Oral Q2H PRN Pickenpack-Cousar, Athena N, NP   5 mg at 08/18/20 1614   Or  . morphine CONCENTRATE 10 MG/0.5ML oral solution 5 mg  5 mg Sublingual Q2H PRN Pickenpack-Cousar, Athena N, NP   5 mg at 08/15/20 1616  . ondansetron (ZOFRAN) injection 4 mg  4 mg Intravenous Q6H PRN Pickenpack-Cousar, Arty Baumgartner, NP   4 mg at 08/11/20 2157  . polyvinyl alcohol (LIQUIFILM TEARS) 1.4 % ophthalmic solution 1 drop  1 drop Both Eyes QID PRN Pickenpack-Cousar, Athena N, NP      . sodium chloride flush (NS) 0.9 % injection  10-40 mL  10-40 mL Intracatheter Q12H Reymundo Poll, MD   10 mL at 08/20/20 0920  . sodium chloride flush (NS) 0.9 % injection 10-40 mL  10-40 mL Intracatheter PRN Reymundo Poll, MD      . sodium chloride flush (NS) 0.9 % injection 10-40 mL  10-40 mL Intracatheter PRN Tyson Alias, MD         Discharge Medications: Please see discharge summary for a list of discharge medications.  Relevant Imaging Results:  Relevant Lab Results:   Additional Information SS#: [id number]  Baldemar Lenis, LCSW

## 2020-08-20 NOTE — Progress Notes (Signed)
Attempted to call for report to Dierdre Harness of [place] at [phone], no answer at the moment, Day shift RN reported the same issue that nobody answered the phone, will re-attempt again.  PICC line removed by IV team  And pt is ready for transport.

## 2020-08-20 NOTE — Progress Notes (Signed)
Subjective:   Overnight, no acute events.  This morning, patient smiled when medical team entered the room. He denies any pain. When asked if he has had breakfast, patient responds with a very weak voice, "eat." Patient shook his head when asked if he wanted the volume turned back on for his television.  Objective:  Vital signs in last 24 hours: Vitals:   08/18/20 1659 08/18/20 2004 08/19/20 0921 08/19/20 2104  BP: 102/76 105/85 101/68 108/78  Pulse: 83 98 (!) 105 (!) 109  Resp: 16 16 (!) 33 16  Temp: 98.2 F (36.8 C) 99.2 F (37.3 C) 99.8 F (37.7 C) 97.7 F (36.5 C)  TempSrc: Axillary Oral Axillary Oral  SpO2: 100% 94% 95% 97%  Weight:      Height:        Intake/Output Summary (Last 24 hours) at 08/20/2020 0620 Last data filed at 08/19/2020 2100 Gross per 24 hour  Intake --  Output 600 ml  Net -600 ml   Filed Weights   07/18/20 0313 07/24/20 0331 07/25/20 0300  Weight: 45.3 kg 46.5 kg 46.2 kg  Physical Exam Vitals and nursing note reviewed.  Constitutional:      General: He is not in acute distress.    Appearance: He is ill-appearing.  Cardiovascular:     Rate and Rhythm: Normal rate and regular rhythm.     Pulses: Normal pulses.     Heart sounds: Normal heart sounds.  Pulmonary:     Comments: Absent breath sounds throughout right lower/mid lung fields Musculoskeletal:     Comments: Contracted bilateral lower extremities and right upper extremity. 2+ pitting edema of dorsal aspects of hands and feet.  Skin:    Comments: (See media tab) Dry gangrene of digits of bilateral feet. Developing hyperpigmentation of digits of bilateral hands  Neurological:     Mental Status: He is alert. Mental status is at baseline.     Comments: Flaccid paralysis of left upper extremity   Labs in last 24 hours: SARS Coronavirus 2 - negative  Imaging in last 24 hours: No results found.  Assessment/Plan:  Principal Problem:   Empyema (HCC) Active Problems:   Pressure ulcer  of sacrum   Ogilvie's syndrome   Protein-calorie malnutrition, severe   Refeeding syndrome   Pneumonia of right lung due to infectious organism   Bacteremia   Spastic quadriparesis (HCC)   Contractures involving both knees   Fever   Goals of care, counseling/discussion  Andres [name] is a 40 year old male with past medical history significant for intellectual disability, fetal alcohol syndrome, autism, seizure disorder and GERD who was admitted on 12/07 following a fall found to have severe malnutrition and dehydration with hospital course complicated by pneumonia with parapneumonic effusion/empyema without ability to obtain source control, refeeding syndrome, upper GI bleed, and 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 Patient subsequently transitioned to comfort care on 08/07/20, now pending hospice placement.  #Comfort care Patient transitioned to comfort care on 08/07/20. Patient is ready for discharge to hospice, however he is awaiting bed availability at long-term care facility. Social worker has reached out to [facility], however there are no beds available. -Palliative care following, appreciate recommendations -CSW seeking bed availability, greatly appreciate support -Continue comfort measures (2L oxygen via [place], glycopyrrolate, haloperidol, morphine, zofran)  #Dry gangrene, active Patient noted to have progression of his digital ischemia, now with dry gangrene of the toes of the bilateral feet. He would benefit from continued supportive care  by nursing. -Continue current plan of care  #VTE ppx: None #IVF: None #Diet: Regular #Code status: Comfort care #Dispo: Anticipated discharge pending hospice placement  Roylene Reason, MD 08/20/2020, 6:20 AM Pager: [phone] After 5pm on weekdays and 1pm on weekends: On Call Pager: [phone]

## 2020-08-20 NOTE — Progress Notes (Signed)
[facility] ([facility])  Received a call from [facility] that this patient would like to discharge today to [place] with our hospice services. Patient was previously approved for [facility] hospice services at discharge. Plan is to d/c per [facility] to [facility].   [facility] referral center updated and is aware of patient discharge today. They will contact family for [facility] RN assessment at discharge. Lauris Poag is Marine scientist.   Please send signed and completed DNR form home with patient/family. Patient will need prescriptions for discharge comfort medications.     Please feel free to call with any questions or concerns.  Yolande Jolly, BSN, [employer] [phone]

## 2020-08-20 NOTE — Progress Notes (Signed)
Patient has a terminal illness and life expectancy is less than six months.

## 2020-08-20 NOTE — Discharge Instructions (Signed)
Mr. Bladen Umar is to be discharged to SNF with hospice services for continued care of his condition.

## 2020-08-20 NOTE — TOC Transition Note (Signed)
Transition of Care [facility]) - CM/SW Discharge Note   Patient Details  Name: Andres [name] MRN: [id number] Date of Birth: 1980-08-17  Transition of Care [facility]) CM/SW Contact:  Chana Bode, Student-Social Work Phone Number: 08/20/2020, 2:41 PM   Clinical Narrative:   Nurse to call report to [phone]      Barriers to Discharge: No Barriers Identified   Patient Goals and CMS Choice Patient states their goals for this hospitalization and ongoing recovery are:: patient unable to participate in goal setting, only oriented to self CMS Medicare.gov Compare Post Acute Care list provided to:: Legal Guardian Choice offered to / list presented to : [facility] POA / Guardian  Discharge Placement              Patient chooses bed at:  [facility]) Patient to be transferred to facility by: PTAR Name of family member notified: Amelia Patient and family notified of of transfer: 08/20/20  Discharge Plan and Services   Discharge Planning Services: CM Consult Post Acute Care Choice: Hospice                               Social Determinants of Health (SDOH) Interventions     Readmission Risk Interventions No flowsheet data found.

## 2020-08-21 NOTE — Progress Notes (Signed)
Pt transported via [facility] on a stretcher to [facility], accompanied by his personal items, attempted to call for report twice but in vain, EMT was made aware and took the phone number just in case they would like to call the [facility]

## 2021-09-15 IMAGING — CT CT HEAD W/O CM
4 of 5 series · 17 of 40 positions shown, 19 images · non-contrast
Comparison: None.

CLINICAL DATA: Altered mental status.

EXAM:
CT HEAD WITHOUT CONTRAST
TECHNIQUE: Contiguous axial images were obtained from the base of the skull
through the vertex without intravenous contrast.

[Series 2: head 5.0 st · axial · 0.43mm/px · z∈[-120,-16]mm · 5 of 33 slices shown, 7 images (1 of 2)]
[im 6/33  brain]
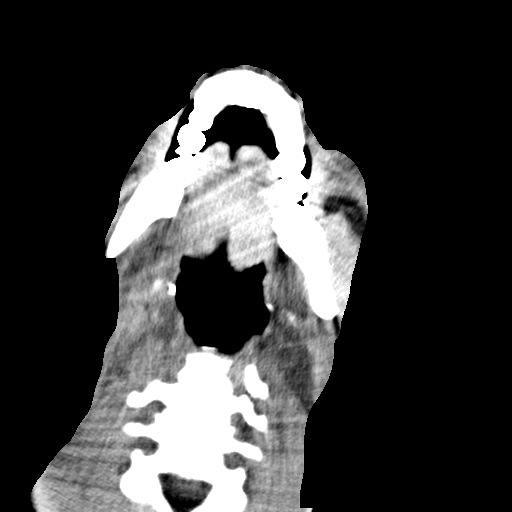
[im 6/33  bone]
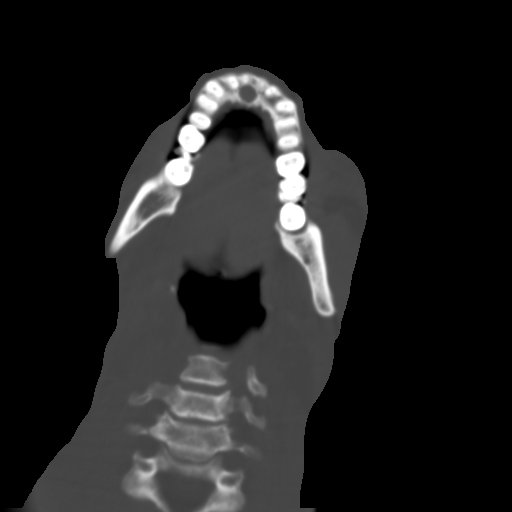
[im 11/33  brain]
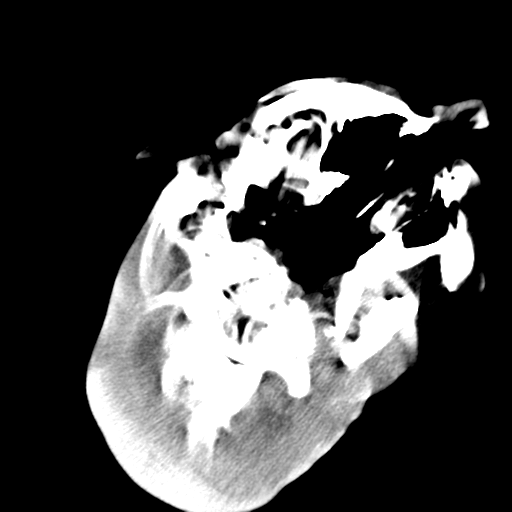
[im 17/33  brain]
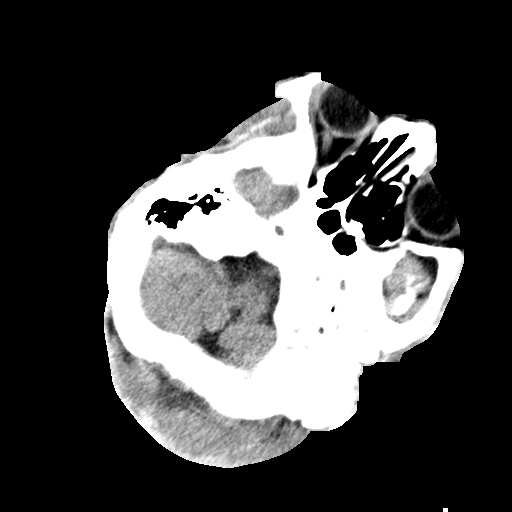
[im 22/33  brain]
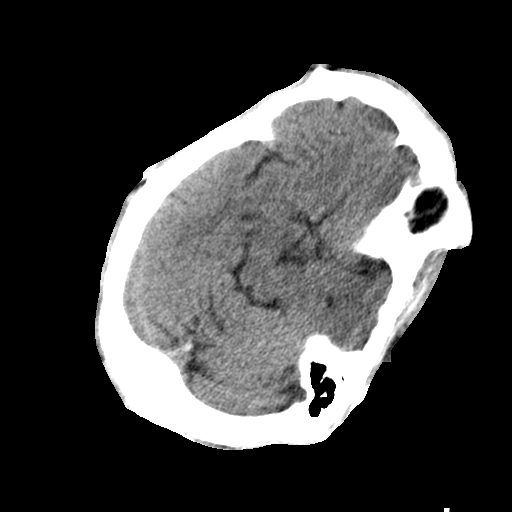
[im 27/33  brain]
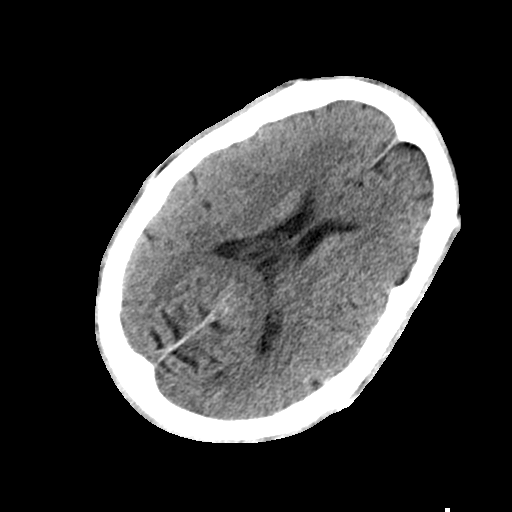
[im 27/33  bone]
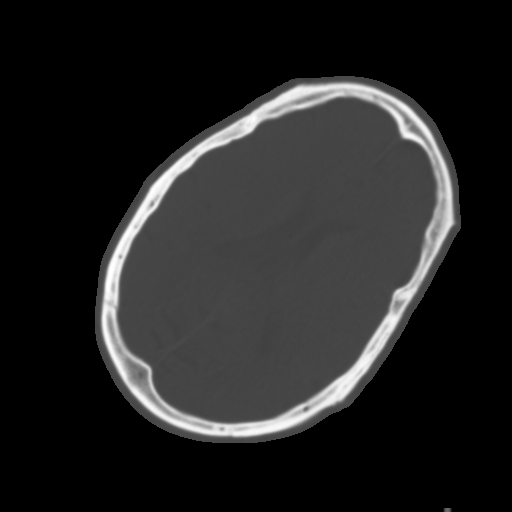

[Series 3: head 5.0 st · axial · 0.40mm/px · z∈[-120,-94]mm · 2 of 17 slices shown (2 of 2)]
[im 6/17  brain]
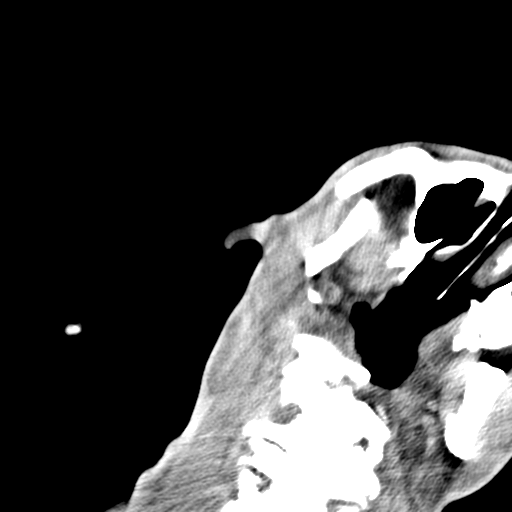
[im 11/17  brain]
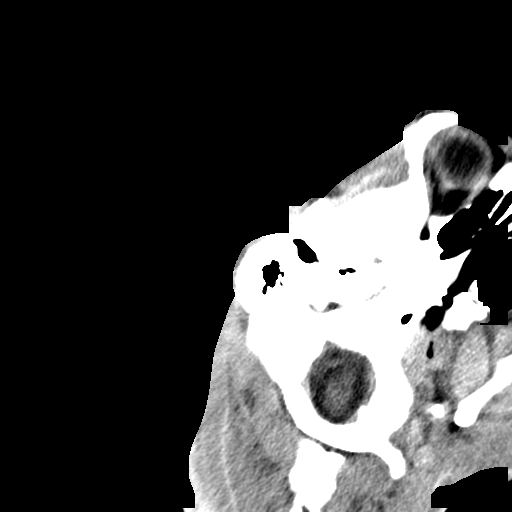

[Series 5: head 2.0 bone · axial · 0.43mm/px · z∈[-136,-14]mm · 7 of 83 slices shown]
[im 6/83  bone]
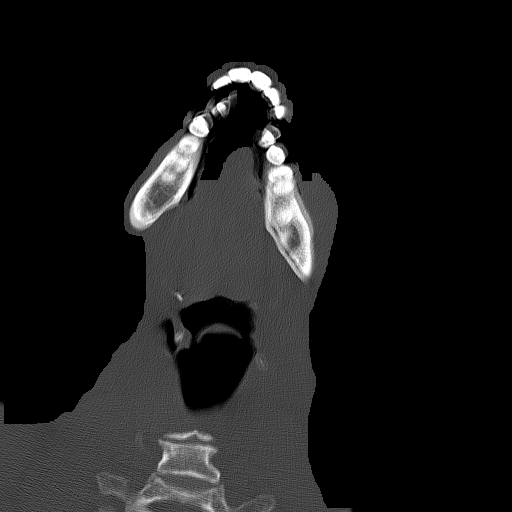
[im 16/83  bone]
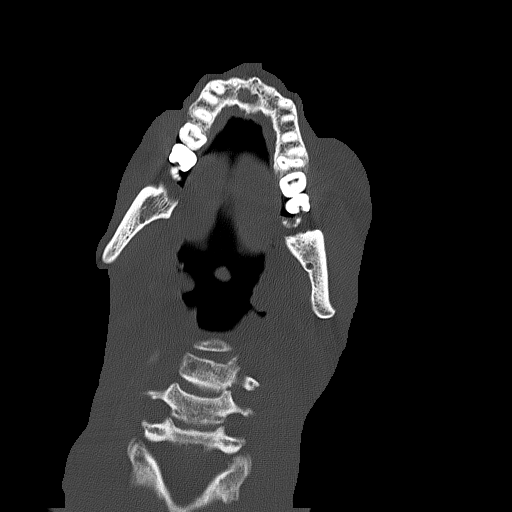
[im 26/83  bone]
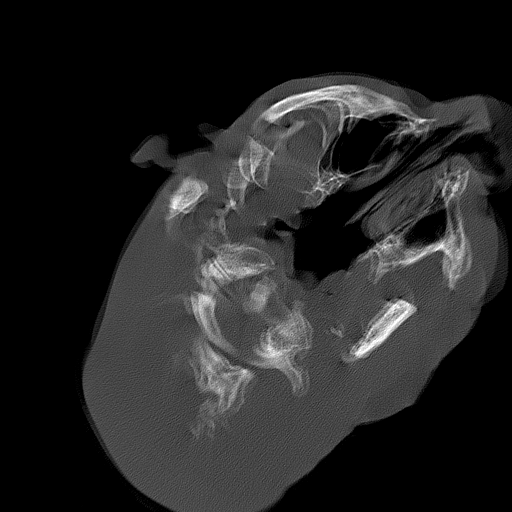
[im 36/83  bone]
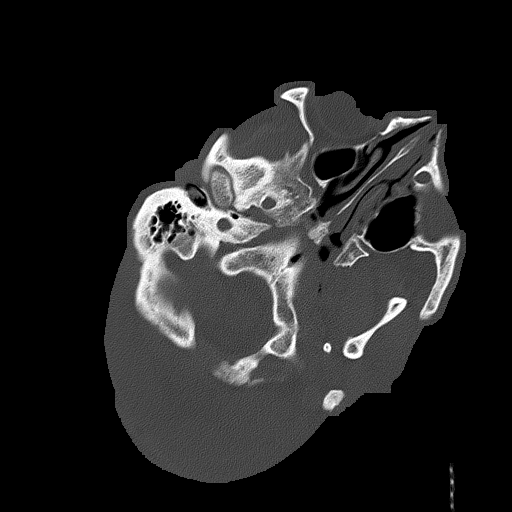
[im 47/83  bone]
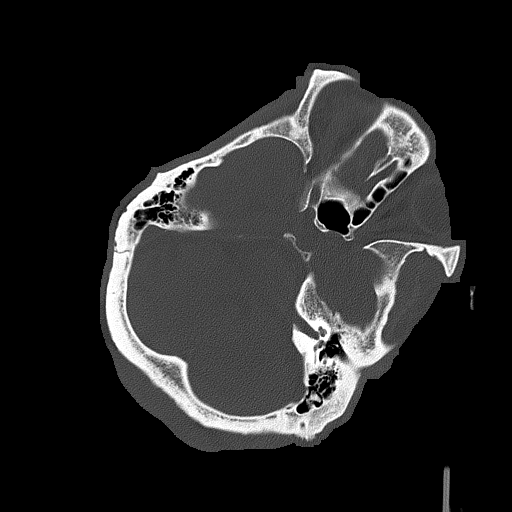
[im 57/83  bone]
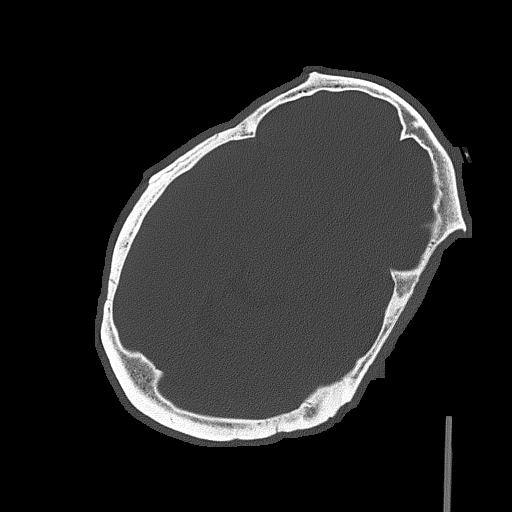
[im 67/83  bone]
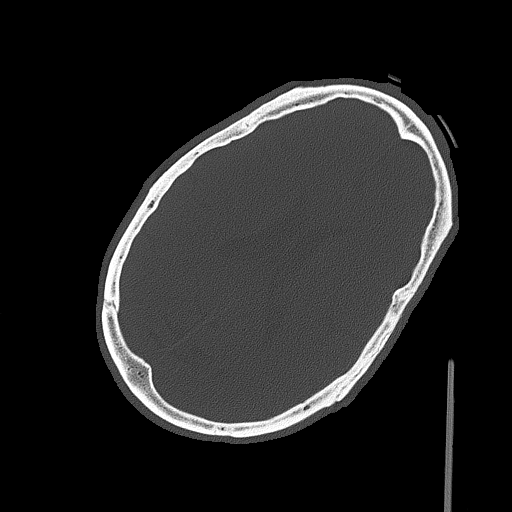

[Series 6: head 3.0 cor st · coronal · 0.34mm/px · 3 of 67 slices shown]
[im 23/67  brain]
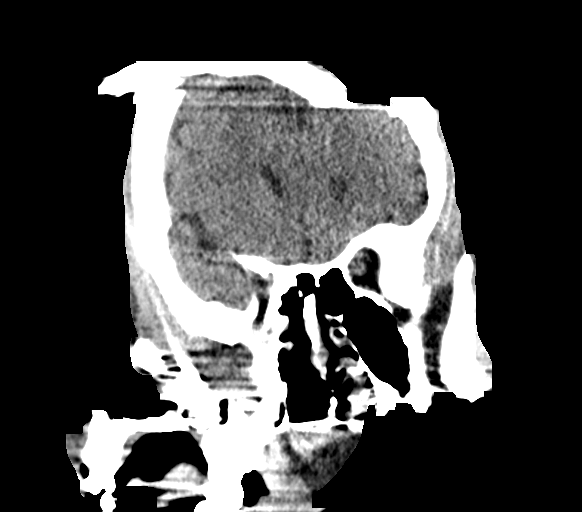
[im 30/67  brain]
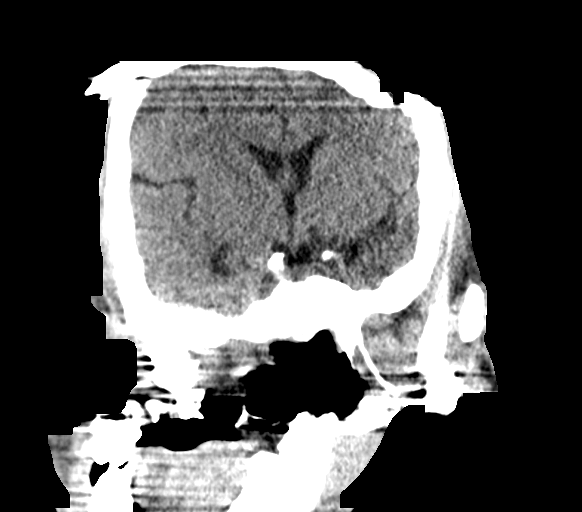
[im 37/67  brain]
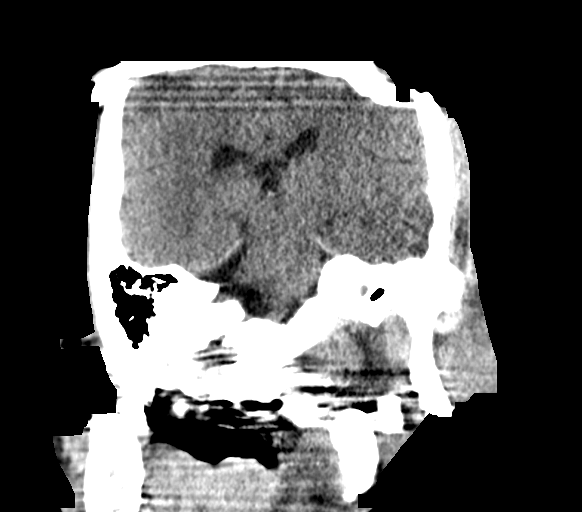

[17 of 40 positions shown; findings below may reference images not displayed]

FINDINGS: Brain: No evidence of acute infarction, hemorrhage, hydrocephalus,
extra-axial collection or mass lesion/mass effect. The exam is
degraded by patient motion.

Vascular: No hyperdense vessel or unexpected calcification.

Skull: Intact.  No focal lesion.

Sinuses/Orbits: Negative.

Other: None.
IMPRESSION: No acute abnormality.

## 2021-09-16 IMAGING — DX DG CHEST 1V
1 series · 1 of 1 positions shown · non-contrast
Comparison: 07/15/2020

CLINICAL DATA: Status post thoracentesis

EXAM:
CHEST  1 VIEW

[chest ap]
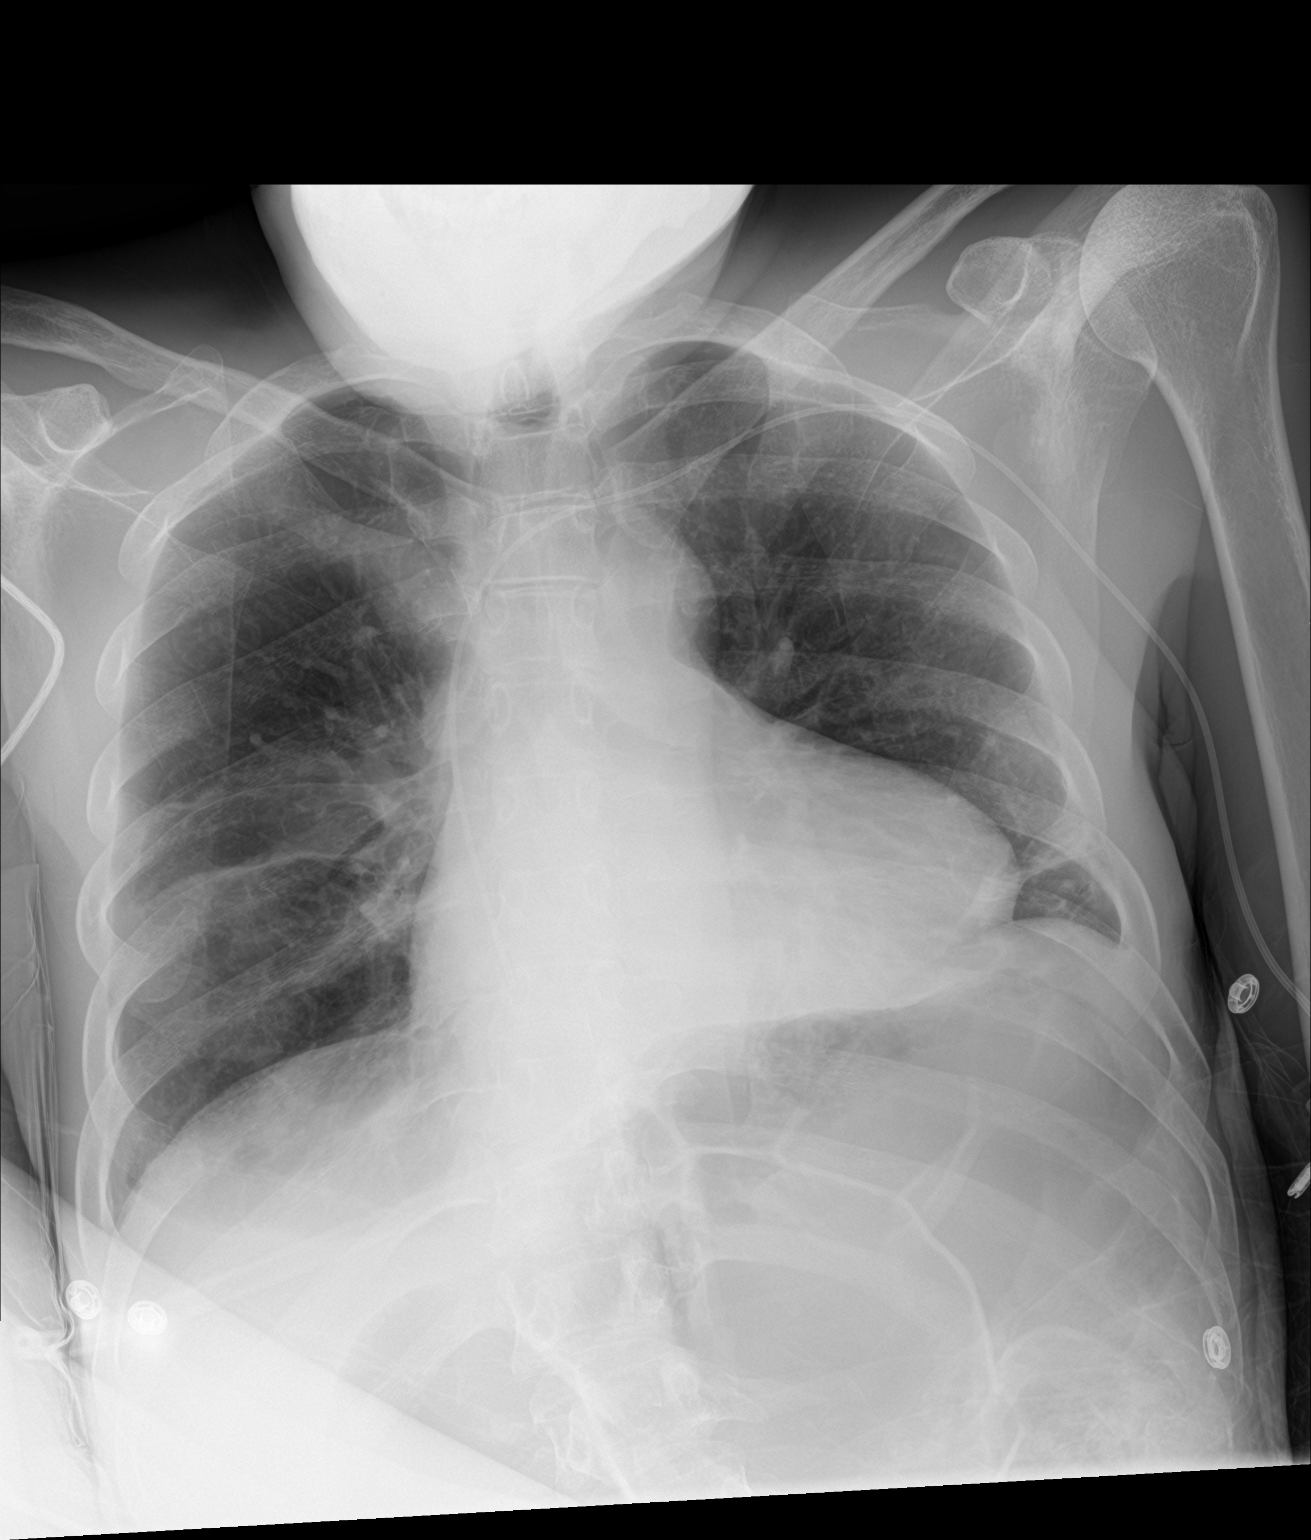

[1 of 1 positions shown; findings below may reference images not displayed]

FINDINGS: Interval right thoracentesis. No pneumothorax. Lingular scarring. No
focal consolidation. Left-sided PICC line with the tip projecting
over the right atrium. Stable cardiomediastinal silhouette. No acute
osseous abnormality.
IMPRESSION: Interval right thoracentesis. No pneumothorax.
